# Patient Record
Sex: Male | Born: 1955 | Race: White | Hispanic: No | Marital: Married | Smoking: Former smoker
Health system: Southern US, Community
[De-identification: ages and names within clinical notes are randomized; demographics above are authoritative.]

## PROBLEM LIST (undated history)

## (undated) DIAGNOSIS — E7800 Pure hypercholesterolemia, unspecified: Secondary | ICD-10-CM

## (undated) DIAGNOSIS — R338 Other retention of urine: Secondary | ICD-10-CM

## (undated) DIAGNOSIS — I10 Essential (primary) hypertension: Secondary | ICD-10-CM

## (undated) DIAGNOSIS — R0602 Shortness of breath: Secondary | ICD-10-CM

## (undated) DIAGNOSIS — C3490 Malignant neoplasm of unspecified part of unspecified bronchus or lung: Secondary | ICD-10-CM

## (undated) DIAGNOSIS — C719 Malignant neoplasm of brain, unspecified: Secondary | ICD-10-CM

## (undated) DIAGNOSIS — J449 Chronic obstructive pulmonary disease, unspecified: Secondary | ICD-10-CM

## (undated) DIAGNOSIS — M1990 Unspecified osteoarthritis, unspecified site: Secondary | ICD-10-CM

## (undated) DIAGNOSIS — G4700 Insomnia, unspecified: Secondary | ICD-10-CM

## (undated) DIAGNOSIS — F419 Anxiety disorder, unspecified: Secondary | ICD-10-CM

## (undated) HISTORY — DX: Pure hypercholesterolemia, unspecified: E78.00

## (undated) HISTORY — DX: Chronic obstructive pulmonary disease, unspecified: J44.9

## (undated) HISTORY — DX: Malignant neoplasm of unspecified part of unspecified bronchus or lung: C34.90

## (undated) HISTORY — DX: Essential (primary) hypertension: I10

## (undated) HISTORY — DX: Malignant neoplasm of brain, unspecified: C71.9

---

## 2011-05-24 ENCOUNTER — Other Ambulatory Visit: Payer: Self-pay | Admitting: Internal Medicine

## 2011-05-30 ENCOUNTER — Ambulatory Visit (HOSPITAL_BASED_OUTPATIENT_CLINIC_OR_DEPARTMENT_OTHER): Payer: BC Managed Care – PPO | Admitting: Internal Medicine

## 2011-05-30 ENCOUNTER — Encounter (INDEPENDENT_AMBULATORY_CARE_PROVIDER_SITE_OTHER): Payer: BC Managed Care – PPO

## 2011-05-30 DIAGNOSIS — C3490 Malignant neoplasm of unspecified part of unspecified bronchus or lung: Secondary | ICD-10-CM

## 2011-05-31 ENCOUNTER — Other Ambulatory Visit: Payer: Self-pay | Admitting: Internal Medicine

## 2011-05-31 DIAGNOSIS — Z85118 Personal history of other malignant neoplasm of bronchus and lung: Secondary | ICD-10-CM

## 2011-05-31 DIAGNOSIS — C3490 Malignant neoplasm of unspecified part of unspecified bronchus or lung: Secondary | ICD-10-CM

## 2011-06-03 ENCOUNTER — Encounter (HOSPITAL_BASED_OUTPATIENT_CLINIC_OR_DEPARTMENT_OTHER): Payer: BC Managed Care – PPO | Admitting: Internal Medicine

## 2011-06-03 ENCOUNTER — Other Ambulatory Visit: Payer: Self-pay | Admitting: Internal Medicine

## 2011-06-03 DIAGNOSIS — C3490 Malignant neoplasm of unspecified part of unspecified bronchus or lung: Secondary | ICD-10-CM

## 2011-06-03 LAB — CBC WITH DIFFERENTIAL/PLATELET
Basophils Absolute: 0 10*3/uL (ref 0.0–0.1)
Eosinophils Absolute: 0.1 10*3/uL (ref 0.0–0.5)
HGB: 14.9 g/dL (ref 13.0–17.1)
LYMPH%: 25.8 % (ref 14.0–49.0)
MONO#: 0.7 10*3/uL (ref 0.1–0.9)
NEUT#: 4.2 10*3/uL (ref 1.5–6.5)
Platelets: 247 10*3/uL (ref 140–400)
RBC: 5.35 10*6/uL (ref 4.20–5.82)
WBC: 6.8 10*3/uL (ref 4.0–10.3)

## 2011-06-03 LAB — COMPREHENSIVE METABOLIC PANEL
Albumin: 4.1 g/dL (ref 3.5–5.2)
BUN: 12 mg/dL (ref 6–23)
CO2: 25 mEq/L (ref 19–32)
Glucose, Bld: 91 mg/dL (ref 70–99)
Potassium: 4.6 mEq/L (ref 3.5–5.3)
Sodium: 139 mEq/L (ref 135–145)
Total Bilirubin: 0.2 mg/dL — ABNORMAL LOW (ref 0.3–1.2)
Total Protein: 7.1 g/dL (ref 6.0–8.3)

## 2011-06-04 ENCOUNTER — Encounter (HOSPITAL_BASED_OUTPATIENT_CLINIC_OR_DEPARTMENT_OTHER): Payer: BC Managed Care – PPO | Admitting: Internal Medicine

## 2011-06-04 ENCOUNTER — Other Ambulatory Visit: Payer: Self-pay | Admitting: Internal Medicine

## 2011-06-04 DIAGNOSIS — C3410 Malignant neoplasm of upper lobe, unspecified bronchus or lung: Secondary | ICD-10-CM

## 2011-06-04 DIAGNOSIS — Z5111 Encounter for antineoplastic chemotherapy: Secondary | ICD-10-CM

## 2011-06-04 DIAGNOSIS — Z77018 Contact with and (suspected) exposure to other hazardous metals: Secondary | ICD-10-CM

## 2011-06-05 ENCOUNTER — Encounter (HOSPITAL_BASED_OUTPATIENT_CLINIC_OR_DEPARTMENT_OTHER): Payer: BC Managed Care – PPO | Admitting: Internal Medicine

## 2011-06-05 ENCOUNTER — Encounter (HOSPITAL_COMMUNITY)
Admission: RE | Admit: 2011-06-05 | Discharge: 2011-06-05 | Disposition: A | Discharge status: Home / Self Care | Payer: BC Managed Care – PPO | Source: Ambulatory Visit | Attending: Internal Medicine | Admitting: Internal Medicine

## 2011-06-05 DIAGNOSIS — C3410 Malignant neoplasm of upper lobe, unspecified bronchus or lung: Secondary | ICD-10-CM

## 2011-06-05 DIAGNOSIS — C3490 Malignant neoplasm of unspecified part of unspecified bronchus or lung: Secondary | ICD-10-CM

## 2011-06-05 DIAGNOSIS — Z5111 Encounter for antineoplastic chemotherapy: Secondary | ICD-10-CM

## 2011-06-05 LAB — GLUCOSE, CAPILLARY: Glucose-Capillary: 119 mg/dL — ABNORMAL HIGH (ref 70–99)

## 2011-06-05 MED ORDER — FLUDEOXYGLUCOSE F - 18 (FDG) INJECTION
18.5000 | Freq: Once | INTRAVENOUS | Status: AC | PRN
  Administered 2011-06-05: 18.5 via INTRAVENOUS

## 2011-06-06 ENCOUNTER — Ambulatory Visit
Admission: RE | Admit: 2011-06-06 | Discharge: 2011-06-06 | Disposition: A | Discharge status: Home / Self Care | Payer: BC Managed Care – PPO | Source: Ambulatory Visit | Attending: Internal Medicine | Admitting: Internal Medicine

## 2011-06-06 ENCOUNTER — Encounter (HOSPITAL_BASED_OUTPATIENT_CLINIC_OR_DEPARTMENT_OTHER): Payer: BC Managed Care – PPO | Admitting: Internal Medicine

## 2011-06-06 DIAGNOSIS — C3410 Malignant neoplasm of upper lobe, unspecified bronchus or lung: Secondary | ICD-10-CM

## 2011-06-06 DIAGNOSIS — Z5111 Encounter for antineoplastic chemotherapy: Secondary | ICD-10-CM

## 2011-06-06 DIAGNOSIS — Z85118 Personal history of other malignant neoplasm of bronchus and lung: Secondary | ICD-10-CM

## 2011-06-06 DIAGNOSIS — Z77018 Contact with and (suspected) exposure to other hazardous metals: Secondary | ICD-10-CM

## 2011-06-06 MED ORDER — GADOBENATE DIMEGLUMINE 529 MG/ML IV SOLN
20.0000 mL | Freq: Once | INTRAVENOUS | Status: AC | PRN
  Administered 2011-06-06: 20 mL via INTRAVENOUS

## 2011-06-07 ENCOUNTER — Encounter (HOSPITAL_BASED_OUTPATIENT_CLINIC_OR_DEPARTMENT_OTHER): Payer: BC Managed Care – PPO | Admitting: Internal Medicine

## 2011-06-07 DIAGNOSIS — IMO0002 Reserved for concepts with insufficient information to code with codable children: Secondary | ICD-10-CM

## 2011-06-07 DIAGNOSIS — C3410 Malignant neoplasm of upper lobe, unspecified bronchus or lung: Secondary | ICD-10-CM

## 2011-06-11 ENCOUNTER — Other Ambulatory Visit: Payer: Self-pay | Admitting: Internal Medicine

## 2011-06-11 ENCOUNTER — Encounter (HOSPITAL_BASED_OUTPATIENT_CLINIC_OR_DEPARTMENT_OTHER): Payer: BC Managed Care – PPO | Admitting: Internal Medicine

## 2011-06-11 DIAGNOSIS — C3410 Malignant neoplasm of upper lobe, unspecified bronchus or lung: Secondary | ICD-10-CM

## 2011-06-11 DIAGNOSIS — C3490 Malignant neoplasm of unspecified part of unspecified bronchus or lung: Secondary | ICD-10-CM

## 2011-06-11 LAB — COMPREHENSIVE METABOLIC PANEL
ALT: 28 U/L (ref 0–53)
Alkaline Phosphatase: 92 U/L (ref 39–117)
Sodium: 138 mEq/L (ref 135–145)
Total Bilirubin: 0.9 mg/dL (ref 0.3–1.2)
Total Protein: 6.3 g/dL (ref 6.0–8.3)

## 2011-06-11 LAB — CBC WITH DIFFERENTIAL/PLATELET
BASO%: 0.6 % (ref 0.0–2.0)
EOS%: 2.9 % (ref 0.0–7.0)
HCT: 40.7 % (ref 38.4–49.9)
MCH: 28.3 pg (ref 27.2–33.4)
MCHC: 34 g/dL (ref 32.0–36.0)
MONO#: 0.1 10*3/uL (ref 0.1–0.9)
RBC: 4.89 10*6/uL (ref 4.20–5.82)
RDW: 13.7 % (ref 11.0–14.6)
WBC: 4 10*3/uL (ref 4.0–10.3)
lymph#: 1.4 10*3/uL (ref 0.9–3.3)

## 2011-06-18 ENCOUNTER — Other Ambulatory Visit: Payer: Self-pay | Admitting: Internal Medicine

## 2011-06-18 ENCOUNTER — Encounter (HOSPITAL_BASED_OUTPATIENT_CLINIC_OR_DEPARTMENT_OTHER): Payer: BC Managed Care – PPO | Admitting: Internal Medicine

## 2011-06-18 DIAGNOSIS — C3490 Malignant neoplasm of unspecified part of unspecified bronchus or lung: Secondary | ICD-10-CM

## 2011-06-18 LAB — COMPREHENSIVE METABOLIC PANEL
Alkaline Phosphatase: 86 U/L (ref 39–117)
Creatinine, Ser: 0.88 mg/dL (ref 0.50–1.35)
Glucose, Bld: 81 mg/dL (ref 70–99)
Sodium: 139 mEq/L (ref 135–145)
Total Bilirubin: 0.2 mg/dL — ABNORMAL LOW (ref 0.3–1.2)
Total Protein: 6.6 g/dL (ref 6.0–8.3)

## 2011-06-18 LAB — CBC WITH DIFFERENTIAL/PLATELET
BASO%: 0.2 % (ref 0.0–2.0)
Eosinophils Absolute: 0.1 10*3/uL (ref 0.0–0.5)
LYMPH%: 30.5 % (ref 14.0–49.0)
MCHC: 33.8 g/dL (ref 32.0–36.0)
MCV: 82.9 fL (ref 79.3–98.0)
MONO%: 9.5 % (ref 0.0–14.0)
NEUT%: 59.2 % (ref 39.0–75.0)
Platelets: 191 10*3/uL (ref 140–400)
RBC: 4.93 10*6/uL (ref 4.20–5.82)

## 2011-06-24 ENCOUNTER — Other Ambulatory Visit: Payer: Self-pay | Admitting: Internal Medicine

## 2011-06-24 ENCOUNTER — Encounter (HOSPITAL_BASED_OUTPATIENT_CLINIC_OR_DEPARTMENT_OTHER): Payer: BC Managed Care – PPO | Admitting: Internal Medicine

## 2011-06-24 DIAGNOSIS — C3490 Malignant neoplasm of unspecified part of unspecified bronchus or lung: Secondary | ICD-10-CM

## 2011-06-24 LAB — CBC WITH DIFFERENTIAL/PLATELET
Basophils Absolute: 0 10*3/uL (ref 0.0–0.1)
Eosinophils Absolute: 0 10*3/uL (ref 0.0–0.5)
HCT: 37.7 % — ABNORMAL LOW (ref 38.4–49.9)
LYMPH%: 23.1 % (ref 14.0–49.0)
MCV: 82.8 fL (ref 79.3–98.0)
MONO#: 1 10*3/uL — ABNORMAL HIGH (ref 0.1–0.9)
MONO%: 12.1 % (ref 0.0–14.0)
NEUT#: 5.5 10*3/uL (ref 1.5–6.5)
NEUT%: 64.3 % (ref 39.0–75.0)
Platelets: 369 10*3/uL (ref 140–400)
RBC: 4.55 10*6/uL (ref 4.20–5.82)

## 2011-06-24 LAB — COMPREHENSIVE METABOLIC PANEL
Alkaline Phosphatase: 86 U/L (ref 39–117)
BUN: 8 mg/dL (ref 6–23)
CO2: 29 mEq/L (ref 19–32)
Creatinine, Ser: 0.83 mg/dL (ref 0.50–1.35)
Glucose, Bld: 108 mg/dL — ABNORMAL HIGH (ref 70–99)
Total Bilirubin: 0.1 mg/dL — ABNORMAL LOW (ref 0.3–1.2)

## 2011-06-25 ENCOUNTER — Encounter (HOSPITAL_BASED_OUTPATIENT_CLINIC_OR_DEPARTMENT_OTHER): Payer: BC Managed Care – PPO | Admitting: Internal Medicine

## 2011-06-25 DIAGNOSIS — C3410 Malignant neoplasm of upper lobe, unspecified bronchus or lung: Secondary | ICD-10-CM

## 2011-06-25 DIAGNOSIS — Z5111 Encounter for antineoplastic chemotherapy: Secondary | ICD-10-CM

## 2011-06-26 ENCOUNTER — Encounter (HOSPITAL_BASED_OUTPATIENT_CLINIC_OR_DEPARTMENT_OTHER): Payer: BC Managed Care – PPO | Admitting: Internal Medicine

## 2011-06-26 DIAGNOSIS — C3410 Malignant neoplasm of upper lobe, unspecified bronchus or lung: Secondary | ICD-10-CM

## 2011-06-26 DIAGNOSIS — Z5111 Encounter for antineoplastic chemotherapy: Secondary | ICD-10-CM

## 2011-06-27 ENCOUNTER — Encounter (HOSPITAL_BASED_OUTPATIENT_CLINIC_OR_DEPARTMENT_OTHER): Payer: BC Managed Care – PPO | Admitting: Internal Medicine

## 2011-06-27 DIAGNOSIS — Z5111 Encounter for antineoplastic chemotherapy: Secondary | ICD-10-CM

## 2011-06-27 DIAGNOSIS — C3410 Malignant neoplasm of upper lobe, unspecified bronchus or lung: Secondary | ICD-10-CM

## 2011-06-28 ENCOUNTER — Encounter (HOSPITAL_BASED_OUTPATIENT_CLINIC_OR_DEPARTMENT_OTHER): Payer: BC Managed Care – PPO | Admitting: Internal Medicine

## 2011-06-28 DIAGNOSIS — C3410 Malignant neoplasm of upper lobe, unspecified bronchus or lung: Secondary | ICD-10-CM

## 2011-06-28 DIAGNOSIS — IMO0002 Reserved for concepts with insufficient information to code with codable children: Secondary | ICD-10-CM

## 2011-07-02 ENCOUNTER — Other Ambulatory Visit: Payer: Self-pay | Admitting: Internal Medicine

## 2011-07-02 ENCOUNTER — Encounter (HOSPITAL_BASED_OUTPATIENT_CLINIC_OR_DEPARTMENT_OTHER): Payer: BC Managed Care – PPO | Admitting: Internal Medicine

## 2011-07-02 DIAGNOSIS — C3490 Malignant neoplasm of unspecified part of unspecified bronchus or lung: Secondary | ICD-10-CM

## 2011-07-02 LAB — CBC WITH DIFFERENTIAL/PLATELET
BASO%: 0.4 % (ref 0.0–2.0)
Basophils Absolute: 0 10*3/uL (ref 0.0–0.1)
EOS%: 0.5 % (ref 0.0–7.0)
HGB: 12.5 g/dL — ABNORMAL LOW (ref 13.0–17.1)
MCH: 28.4 pg (ref 27.2–33.4)
MCHC: 34.3 g/dL (ref 32.0–36.0)
MCV: 82.7 fL (ref 79.3–98.0)
MONO%: 5.1 % (ref 0.0–14.0)
NEUT%: 76.6 % — ABNORMAL HIGH (ref 39.0–75.0)
RDW: 14.6 % (ref 11.0–14.6)

## 2011-07-02 LAB — COMPREHENSIVE METABOLIC PANEL
AST: 14 U/L (ref 0–37)
Alkaline Phosphatase: 110 U/L (ref 39–117)
BUN: 13 mg/dL (ref 6–23)
Creatinine, Ser: 0.81 mg/dL (ref 0.50–1.35)
Potassium: 4.5 mEq/L (ref 3.5–5.3)

## 2011-07-09 ENCOUNTER — Other Ambulatory Visit: Payer: Self-pay | Admitting: Internal Medicine

## 2011-07-09 ENCOUNTER — Encounter (HOSPITAL_BASED_OUTPATIENT_CLINIC_OR_DEPARTMENT_OTHER): Payer: BC Managed Care – PPO | Admitting: Internal Medicine

## 2011-07-09 DIAGNOSIS — C3490 Malignant neoplasm of unspecified part of unspecified bronchus or lung: Secondary | ICD-10-CM

## 2011-07-09 LAB — COMPREHENSIVE METABOLIC PANEL
AST: 14 U/L (ref 0–37)
Alkaline Phosphatase: 105 U/L (ref 39–117)
BUN: 11 mg/dL (ref 6–23)
Calcium: 9.4 mg/dL (ref 8.4–10.5)
Creatinine, Ser: 0.94 mg/dL (ref 0.50–1.35)
Total Bilirubin: 0.3 mg/dL (ref 0.3–1.2)

## 2011-07-09 LAB — CBC WITH DIFFERENTIAL/PLATELET
Basophils Absolute: 0 10*3/uL (ref 0.0–0.1)
EOS%: 1.2 % (ref 0.0–7.0)
HCT: 38 % — ABNORMAL LOW (ref 38.4–49.9)
HGB: 12.9 g/dL — ABNORMAL LOW (ref 13.0–17.1)
LYMPH%: 26.2 % (ref 14.0–49.0)
MCH: 28 pg (ref 27.2–33.4)
MCV: 82.4 fL (ref 79.3–98.0)
MONO%: 7.7 % (ref 0.0–14.0)
NEUT%: 64.6 % (ref 39.0–75.0)

## 2011-07-16 ENCOUNTER — Other Ambulatory Visit: Payer: Self-pay | Admitting: Internal Medicine

## 2011-07-16 ENCOUNTER — Encounter (HOSPITAL_BASED_OUTPATIENT_CLINIC_OR_DEPARTMENT_OTHER): Payer: BC Managed Care – PPO | Admitting: Internal Medicine

## 2011-07-16 DIAGNOSIS — C3490 Malignant neoplasm of unspecified part of unspecified bronchus or lung: Secondary | ICD-10-CM

## 2011-07-16 DIAGNOSIS — Z5111 Encounter for antineoplastic chemotherapy: Secondary | ICD-10-CM

## 2011-07-16 DIAGNOSIS — C3410 Malignant neoplasm of upper lobe, unspecified bronchus or lung: Secondary | ICD-10-CM

## 2011-07-16 LAB — CBC WITH DIFFERENTIAL/PLATELET
Basophils Absolute: 0 10*3/uL (ref 0.0–0.1)
EOS%: 1 % (ref 0.0–7.0)
HCT: 38 % — ABNORMAL LOW (ref 38.4–49.9)
HGB: 12.9 g/dL — ABNORMAL LOW (ref 13.0–17.1)
MCH: 28.1 pg (ref 27.2–33.4)
MCV: 82.7 fL (ref 79.3–98.0)
NEUT%: 56.8 % (ref 39.0–75.0)
lymph#: 2.5 10*3/uL (ref 0.9–3.3)

## 2011-07-17 ENCOUNTER — Encounter (HOSPITAL_BASED_OUTPATIENT_CLINIC_OR_DEPARTMENT_OTHER): Payer: BC Managed Care – PPO | Admitting: Internal Medicine

## 2011-07-17 DIAGNOSIS — Z5111 Encounter for antineoplastic chemotherapy: Secondary | ICD-10-CM

## 2011-07-17 DIAGNOSIS — C3410 Malignant neoplasm of upper lobe, unspecified bronchus or lung: Secondary | ICD-10-CM

## 2011-07-17 LAB — COMPREHENSIVE METABOLIC PANEL
Alkaline Phosphatase: 84 U/L (ref 39–117)
BUN: 8 mg/dL (ref 6–23)
Creatinine, Ser: 0.85 mg/dL (ref 0.50–1.35)
Glucose, Bld: 92 mg/dL (ref 70–99)
Total Bilirubin: 0.2 mg/dL — ABNORMAL LOW (ref 0.3–1.2)

## 2011-07-18 ENCOUNTER — Encounter (HOSPITAL_BASED_OUTPATIENT_CLINIC_OR_DEPARTMENT_OTHER): Payer: BC Managed Care – PPO | Admitting: Internal Medicine

## 2011-07-18 DIAGNOSIS — C3410 Malignant neoplasm of upper lobe, unspecified bronchus or lung: Secondary | ICD-10-CM

## 2011-07-18 DIAGNOSIS — Z5111 Encounter for antineoplastic chemotherapy: Secondary | ICD-10-CM

## 2011-07-19 ENCOUNTER — Encounter (HOSPITAL_BASED_OUTPATIENT_CLINIC_OR_DEPARTMENT_OTHER): Payer: BC Managed Care – PPO | Admitting: Internal Medicine

## 2011-07-19 DIAGNOSIS — Z5189 Encounter for other specified aftercare: Secondary | ICD-10-CM

## 2011-07-19 DIAGNOSIS — C3410 Malignant neoplasm of upper lobe, unspecified bronchus or lung: Secondary | ICD-10-CM

## 2011-07-23 ENCOUNTER — Other Ambulatory Visit: Payer: Self-pay | Admitting: Internal Medicine

## 2011-07-23 ENCOUNTER — Encounter (HOSPITAL_BASED_OUTPATIENT_CLINIC_OR_DEPARTMENT_OTHER): Payer: BC Managed Care – PPO | Admitting: Internal Medicine

## 2011-07-23 DIAGNOSIS — C3490 Malignant neoplasm of unspecified part of unspecified bronchus or lung: Secondary | ICD-10-CM

## 2011-07-23 LAB — CBC WITH DIFFERENTIAL/PLATELET
Basophils Absolute: 0.1 10*3/uL (ref 0.0–0.1)
Eosinophils Absolute: 0.1 10*3/uL (ref 0.0–0.5)
HGB: 12.6 g/dL — ABNORMAL LOW (ref 13.0–17.1)
LYMPH%: 13.8 % — ABNORMAL LOW (ref 14.0–49.0)
MCV: 83.9 fL (ref 79.3–98.0)
MONO#: 0.4 10*3/uL (ref 0.1–0.9)
MONO%: 2.3 % (ref 0.0–14.0)
NEUT#: 12.7 10*3/uL — ABNORMAL HIGH (ref 1.5–6.5)
Platelets: 348 10*3/uL (ref 140–400)
WBC: 15.2 10*3/uL — ABNORMAL HIGH (ref 4.0–10.3)

## 2011-07-23 LAB — COMPREHENSIVE METABOLIC PANEL
Albumin: 4.3 g/dL (ref 3.5–5.2)
Alkaline Phosphatase: 122 U/L — ABNORMAL HIGH (ref 39–117)
BUN: 12 mg/dL (ref 6–23)
CO2: 25 mEq/L (ref 19–32)
Glucose, Bld: 84 mg/dL (ref 70–99)
Potassium: 4.3 mEq/L (ref 3.5–5.3)
Total Bilirubin: 0.5 mg/dL (ref 0.3–1.2)
Total Protein: 7 g/dL (ref 6.0–8.3)

## 2011-07-30 ENCOUNTER — Other Ambulatory Visit: Payer: Self-pay | Admitting: Internal Medicine

## 2011-07-30 ENCOUNTER — Encounter (HOSPITAL_BASED_OUTPATIENT_CLINIC_OR_DEPARTMENT_OTHER): Payer: BC Managed Care – PPO | Admitting: Internal Medicine

## 2011-07-30 DIAGNOSIS — C3490 Malignant neoplasm of unspecified part of unspecified bronchus or lung: Secondary | ICD-10-CM

## 2011-07-30 LAB — CBC WITH DIFFERENTIAL/PLATELET
Basophils Absolute: 0 10*3/uL (ref 0.0–0.1)
Eosinophils Absolute: 0.1 10*3/uL (ref 0.0–0.5)
HGB: 12.4 g/dL — ABNORMAL LOW (ref 13.0–17.1)
MCV: 84.9 fL (ref 79.3–98.0)
MONO#: 0.8 10*3/uL (ref 0.1–0.9)
MONO%: 9.5 % (ref 0.0–14.0)
NEUT#: 4.7 10*3/uL (ref 1.5–6.5)
RBC: 4.29 10*6/uL (ref 4.20–5.82)
RDW: 18.2 % — ABNORMAL HIGH (ref 11.0–14.6)
WBC: 8.2 10*3/uL (ref 4.0–10.3)
lymph#: 2.5 10*3/uL (ref 0.9–3.3)

## 2011-07-30 LAB — COMPREHENSIVE METABOLIC PANEL
Albumin: 3.9 g/dL (ref 3.5–5.2)
Alkaline Phosphatase: 103 U/L (ref 39–117)
BUN: 10 mg/dL (ref 6–23)
CO2: 25 mEq/L (ref 19–32)
Calcium: 8.8 mg/dL (ref 8.4–10.5)
Chloride: 108 mEq/L (ref 96–112)
Glucose, Bld: 95 mg/dL (ref 70–99)
Potassium: 4.5 mEq/L (ref 3.5–5.3)
Sodium: 138 mEq/L (ref 135–145)
Total Protein: 6.6 g/dL (ref 6.0–8.3)

## 2011-08-02 ENCOUNTER — Ambulatory Visit (HOSPITAL_COMMUNITY)
Admission: RE | Admit: 2011-08-02 | Discharge: 2011-08-02 | Disposition: A | Discharge status: Home / Self Care | Payer: BC Managed Care – PPO | Source: Ambulatory Visit | Attending: Internal Medicine | Admitting: Internal Medicine

## 2011-08-02 ENCOUNTER — Other Ambulatory Visit (HOSPITAL_COMMUNITY): Payer: BC Managed Care – PPO

## 2011-08-02 DIAGNOSIS — I2510 Atherosclerotic heart disease of native coronary artery without angina pectoris: Secondary | ICD-10-CM | POA: Insufficient documentation

## 2011-08-02 DIAGNOSIS — J984 Other disorders of lung: Secondary | ICD-10-CM | POA: Insufficient documentation

## 2011-08-02 DIAGNOSIS — C3490 Malignant neoplasm of unspecified part of unspecified bronchus or lung: Secondary | ICD-10-CM | POA: Insufficient documentation

## 2011-08-02 MED ORDER — IOHEXOL 300 MG/ML  SOLN
80.0000 mL | Freq: Once | INTRAMUSCULAR | Status: AC | PRN
  Administered 2011-08-02: 80 mL via INTRAVENOUS

## 2011-08-06 ENCOUNTER — Other Ambulatory Visit: Payer: Self-pay | Admitting: Internal Medicine

## 2011-08-06 ENCOUNTER — Encounter (HOSPITAL_BASED_OUTPATIENT_CLINIC_OR_DEPARTMENT_OTHER): Payer: BC Managed Care – PPO | Admitting: Internal Medicine

## 2011-08-06 DIAGNOSIS — Z79899 Other long term (current) drug therapy: Secondary | ICD-10-CM

## 2011-08-06 DIAGNOSIS — IMO0002 Reserved for concepts with insufficient information to code with codable children: Secondary | ICD-10-CM

## 2011-08-06 DIAGNOSIS — C3490 Malignant neoplasm of unspecified part of unspecified bronchus or lung: Secondary | ICD-10-CM

## 2011-08-06 DIAGNOSIS — Z5111 Encounter for antineoplastic chemotherapy: Secondary | ICD-10-CM

## 2011-08-06 DIAGNOSIS — C3410 Malignant neoplasm of upper lobe, unspecified bronchus or lung: Secondary | ICD-10-CM

## 2011-08-06 LAB — CBC WITH DIFFERENTIAL/PLATELET
BASO%: 0.3 % (ref 0.0–2.0)
Eosinophils Absolute: 0.1 10*3/uL (ref 0.0–0.5)
LYMPH%: 22.7 % (ref 14.0–49.0)
MCHC: 34.3 g/dL (ref 32.0–36.0)
MCV: 84.9 fL (ref 79.3–98.0)
MONO#: 1.2 10*3/uL — ABNORMAL HIGH (ref 0.1–0.9)
MONO%: 14.6 % — ABNORMAL HIGH (ref 0.0–14.0)
NEUT#: 5.1 10*3/uL (ref 1.5–6.5)
RBC: 4.49 10*6/uL (ref 4.20–5.82)
RDW: 18.7 % — ABNORMAL HIGH (ref 11.0–14.6)
WBC: 8.3 10*3/uL (ref 4.0–10.3)

## 2011-08-12 ENCOUNTER — Encounter: Payer: Self-pay | Admitting: Thoracic Surgery

## 2011-08-12 DIAGNOSIS — I10 Essential (primary) hypertension: Secondary | ICD-10-CM

## 2011-08-12 DIAGNOSIS — E7800 Pure hypercholesterolemia, unspecified: Secondary | ICD-10-CM

## 2011-08-15 ENCOUNTER — Other Ambulatory Visit: Payer: Self-pay | Admitting: Thoracic Surgery

## 2011-08-15 ENCOUNTER — Encounter: Payer: BC Managed Care – PPO | Admitting: Internal Medicine

## 2011-08-15 ENCOUNTER — Ambulatory Visit (INDEPENDENT_AMBULATORY_CARE_PROVIDER_SITE_OTHER): Payer: BC Managed Care – PPO | Admitting: Thoracic Surgery

## 2011-08-15 ENCOUNTER — Encounter: Payer: Self-pay | Admitting: Thoracic Surgery

## 2011-08-15 VITALS — BP 144/90 | HR 80 | Resp 18 | Ht 73.0 in | Wt 232.0 lb

## 2011-08-15 DIAGNOSIS — C3490 Malignant neoplasm of unspecified part of unspecified bronchus or lung: Secondary | ICD-10-CM

## 2011-08-15 NOTE — Progress Notes (Signed)
PCP is [name], MD Referring Provider is Lajuana Matte., MD  Chief Complaint  Patient presents with  . Lung Cancer    S/P chemotherapy. Discuss surgery    HPI: This 55 year old Caucasian smoker was found to have a left upper lobe lesion. 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 He underwent 3 cycles of chemotherapy with a marked reduction in left upper lobe lesion. Initial PET scan showed no evidence of metastases metastatic disease. Initial brain MRI was also negative. Followup CT scan showed marked reduction in the lung lesion. He is referred here for possible resection. We plan to to get another PET scan and pulmonary function tests. He is still smoking he was cautioned to quit smoking he said no hemoptysis fever chills or excessive sputum. We will schedule surgery for October 22. We'll see him back in one week.  Past Medical History  Diagnosis Date  . HTN (hypertension)   . Hypercholesteremia     No past surgical history on file.  No family history on file.  Social History History  Substance Use Topics  . Smoking status: Current Everyday Smoker -- 2.0 packs/day    Types: Cigarettes  . Smokeless tobacco: Not on file  . Alcohol Use: 3.0 - 6.0 oz/week    6-12 drink(s) per week    Current Outpatient Prescriptions  Medication Sig Dispense Refill  . albuterol (PROVENTIL HFA;VENTOLIN HFA) 108 (90 BASE) MCG/ACT inhaler Inhale 2 puffs into the lungs every 6 (six) hours as needed.          No Known Allergies  Review of Systems  Constitutional: Negative.   HENT: Negative.   Eyes: Negative.   Respiratory: Negative for cough, choking, chest tightness and shortness of breath.   Cardiovascular: Negative.   Gastrointestinal: Negative.   Genitourinary: Negative.   Musculoskeletal: Negative.   Neurological: Negative.   Hematological: Negative.   Psychiatric/Behavioral: Negative.     BP 144/90  Pulse 80  Resp 18  Ht 6\' 1"  (1.854 m)  Wt  232 lb (105.235 kg)  BMI 30.61 kg/m2  SpO2 98% Physical Exam  Constitutional: He is oriented to person, place, and time. He appears well-developed and well-nourished.  HENT:  Head: Normocephalic and atraumatic.  Right Ear: External ear normal.  Mouth/Throat: Oropharynx is clear and moist.  Eyes: EOM are normal. Pupils are equal, round, and reactive to light.  Neck: Normal range of motion. Neck supple.  Cardiovascular: Normal rate, regular rhythm, normal heart sounds and intact distal pulses.   Pulmonary/Chest: Effort normal and breath sounds normal. No respiratory distress.  Abdominal: Bowel sounds are normal. He exhibits no distension. There is no tenderness.  Musculoskeletal: He exhibits no edema and no tenderness.  Lymphadenopathy:    He has no cervical adenopathy.  Neurological: He is alert and oriented to person, place, and time. He has normal reflexes.  Skin: Skin is warm and dry.  Psychiatric: He has a normal mood and affect.     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 Status post chemotherapy.   Plan: Repeat PET scan and obtaining pulmonary function test. Probably a left upper lobectomy.

## 2011-08-22 ENCOUNTER — Encounter (HOSPITAL_COMMUNITY): Payer: Self-pay

## 2011-08-22 ENCOUNTER — Encounter (HOSPITAL_COMMUNITY)
Admission: RE | Admit: 2011-08-22 | Discharge: 2011-08-22 | Disposition: A | Discharge status: Home / Self Care | Payer: BC Managed Care – PPO | Source: Ambulatory Visit | Attending: Thoracic Surgery | Admitting: Thoracic Surgery

## 2011-08-22 ENCOUNTER — Ambulatory Visit (HOSPITAL_COMMUNITY)
Admission: RE | Admit: 2011-08-22 | Discharge: 2011-08-22 | Disposition: A | Discharge status: Home / Self Care | Payer: BC Managed Care – PPO | Source: Ambulatory Visit | Attending: Thoracic Surgery | Admitting: Thoracic Surgery

## 2011-08-22 DIAGNOSIS — C3490 Malignant neoplasm of unspecified part of unspecified bronchus or lung: Secondary | ICD-10-CM

## 2011-08-22 DIAGNOSIS — M954 Acquired deformity of chest and rib: Secondary | ICD-10-CM | POA: Insufficient documentation

## 2011-08-22 DIAGNOSIS — C3410 Malignant neoplasm of upper lobe, unspecified bronchus or lung: Secondary | ICD-10-CM | POA: Insufficient documentation

## 2011-08-22 DIAGNOSIS — R918 Other nonspecific abnormal finding of lung field: Secondary | ICD-10-CM | POA: Insufficient documentation

## 2011-08-22 LAB — GLUCOSE, CAPILLARY: Glucose-Capillary: 100 mg/dL — ABNORMAL HIGH (ref 70–99)

## 2011-08-22 LAB — PULMONARY FUNCTION TEST

## 2011-08-22 MED ORDER — FLUDEOXYGLUCOSE F - 18 (FDG) INJECTION
16.2000 | Freq: Once | INTRAVENOUS | Status: AC | PRN
  Administered 2011-08-22: 16.2 via INTRAVENOUS

## 2011-08-29 ENCOUNTER — Encounter: Payer: Self-pay | Admitting: Thoracic Surgery

## 2011-08-29 ENCOUNTER — Ambulatory Visit (INDEPENDENT_AMBULATORY_CARE_PROVIDER_SITE_OTHER): Payer: Self-pay | Admitting: Thoracic Surgery

## 2011-08-29 ENCOUNTER — Encounter: Payer: Self-pay | Admitting: *Deleted

## 2011-08-29 ENCOUNTER — Encounter (HOSPITAL_COMMUNITY)
Admission: RE | Admit: 2011-08-29 | Discharge: 2011-08-29 | Disposition: A | Discharge status: Home / Self Care | Payer: BC Managed Care – PPO | Source: Ambulatory Visit | Attending: Thoracic Surgery | Admitting: Thoracic Surgery

## 2011-08-29 VITALS — BP 150/95 | HR 82 | Resp 18

## 2011-08-29 DIAGNOSIS — C3490 Malignant neoplasm of unspecified part of unspecified bronchus or lung: Secondary | ICD-10-CM

## 2011-08-29 LAB — COMPREHENSIVE METABOLIC PANEL
ALT: 15 U/L (ref 0–53)
AST: 15 U/L (ref 0–37)
Albumin: 3.6 g/dL (ref 3.5–5.2)
CO2: 26 mEq/L (ref 19–32)
Chloride: 103 mEq/L (ref 96–112)
Creatinine, Ser: 0.86 mg/dL (ref 0.50–1.35)
GFR calc non Af Amer: >90 mL/min (ref >90)
Potassium: 4 mEq/L (ref 3.5–5.1)
Sodium: 139 mEq/L (ref 135–145)
Total Bilirubin: 0.2 mg/dL — ABNORMAL LOW (ref 0.3–1.2)

## 2011-08-29 LAB — URINE MICROSCOPIC-ADD ON

## 2011-08-29 LAB — BLOOD GAS, ARTERIAL
Acid-Base Excess: 1.2 mmol/L (ref 0.0–2.0)
Bicarbonate: 24.2 mEq/L — ABNORMAL HIGH (ref 20.0–24.0)
TCO2: 25.2 mmol/L (ref 0–100)
pCO2 arterial: 31.5 mmHg — ABNORMAL LOW (ref 35.0–45.0)
pH, Arterial: 7.498 — ABNORMAL HIGH (ref 7.350–7.450)

## 2011-08-29 LAB — CBC
HCT: 38.5 % — ABNORMAL LOW (ref 39.0–52.0)
Hemoglobin: 13.1 g/dL (ref 13.0–17.0)
MCV: 85.4 fL (ref 78.0–100.0)
RBC: 4.51 MIL/uL (ref 4.22–5.81)
WBC: 10 10*3/uL (ref 4.0–10.5)

## 2011-08-29 LAB — PROTIME-INR: INR: 0.94 (ref 0.00–1.49)

## 2011-08-29 LAB — URINALYSIS, ROUTINE W REFLEX MICROSCOPIC
Glucose, UA: NEGATIVE mg/dL
Protein, ur: NEGATIVE mg/dL
Specific Gravity, Urine: 1.018 (ref 1.005–1.030)
Urobilinogen, UA: 1 mg/dL (ref 0.0–1.0)

## 2011-08-29 LAB — APTT: aPTT: 26 seconds (ref 24–37)

## 2011-08-29 NOTE — Progress Notes (Signed)
HPI patient returns today for followup. We plan to do his surgery on October 22. We'll do a left VATS with resection of the left upper lobe lesion and wedge resection of the left lower lobe lesion. Pulmonary function tests showed an FVC of 3.76 or 68% of predicted and an FEV1 of 2.48 or 59% predicted. Both improved with bronchodilators the diffusion capacity was 88%. The risk of the procedure was explained he the patient and his family. The chance of success was also explained at this is a moderately risk operation. The patient understand the risks and agrees to the surgery. I have a good chance to resect both of these lesions.   Current Outpatient Prescriptions  Medication Sig Dispense Refill  . albuterol (PROVENTIL HFA;VENTOLIN HFA) 108 (90 BASE) MCG/ACT inhaler Inhale 2 puffs into the lungs every 6 (six) hours as needed.           Review of Systems: Unchanged   Physical Exam lungs are clear to auscultation percussion heart regular sinus rhythm   Diagnostic Tests a PET scan showed increased uptake in the left upper lobe lesion in no uptake in the left lower lobe lesion no uptake in the lymph nodes.Impression: Non-small cell possible small cell lung cancer left upper lobe status post chemoradiation   Plan: Left VATS and left upper lobe and possible left lower lobe nodule with node dissection.

## 2011-09-02 ENCOUNTER — Ambulatory Visit (HOSPITAL_COMMUNITY)
Admission: RE | Admit: 2011-09-02 | Discharge: 2011-09-02 | Disposition: A | Discharge status: Home / Self Care | Payer: BC Managed Care – PPO | Source: Ambulatory Visit | Attending: Thoracic Surgery | Admitting: Thoracic Surgery

## 2011-09-02 ENCOUNTER — Other Ambulatory Visit: Payer: Self-pay | Admitting: Thoracic Surgery

## 2011-09-02 ENCOUNTER — Inpatient Hospital Stay (HOSPITAL_COMMUNITY): Payer: BC Managed Care – PPO

## 2011-09-02 ENCOUNTER — Inpatient Hospital Stay (HOSPITAL_COMMUNITY)
Admission: RE | Admit: 2011-09-02 | Discharge: 2011-09-06 | DRG: 075 | Disposition: A | Discharge status: Home / Self Care | Payer: BC Managed Care – PPO | Source: Ambulatory Visit | Attending: Thoracic Surgery | Admitting: Thoracic Surgery

## 2011-09-02 DIAGNOSIS — F17200 Nicotine dependence, unspecified, uncomplicated: Secondary | ICD-10-CM | POA: Diagnosis present

## 2011-09-02 DIAGNOSIS — I10 Essential (primary) hypertension: Secondary | ICD-10-CM | POA: Diagnosis present

## 2011-09-02 DIAGNOSIS — C3490 Malignant neoplasm of unspecified part of unspecified bronchus or lung: Secondary | ICD-10-CM

## 2011-09-02 DIAGNOSIS — C3410 Malignant neoplasm of upper lobe, unspecified bronchus or lung: Principal | ICD-10-CM | POA: Diagnosis present

## 2011-09-02 DIAGNOSIS — E7800 Pure hypercholesterolemia, unspecified: Secondary | ICD-10-CM | POA: Diagnosis present

## 2011-09-02 DIAGNOSIS — Z01812 Encounter for preprocedural laboratory examination: Secondary | ICD-10-CM

## 2011-09-02 DIAGNOSIS — J4489 Other specified chronic obstructive pulmonary disease: Secondary | ICD-10-CM | POA: Diagnosis present

## 2011-09-02 DIAGNOSIS — J449 Chronic obstructive pulmonary disease, unspecified: Secondary | ICD-10-CM | POA: Diagnosis present

## 2011-09-02 HISTORY — PX: OTHER SURGICAL HISTORY: SHX169

## 2011-09-02 SURGERY — Surgical Case
Anesthesia: *Unknown

## 2011-09-03 ENCOUNTER — Inpatient Hospital Stay (HOSPITAL_COMMUNITY): Payer: BC Managed Care – PPO

## 2011-09-03 LAB — TYPE AND SCREEN
Antibody Screen: NEGATIVE
Unit division: 0

## 2011-09-03 LAB — BASIC METABOLIC PANEL
BUN: 6 mg/dL (ref 6–23)
GFR calc Af Amer: >90 mL/min (ref >90)
GFR calc non Af Amer: >90 mL/min (ref >90)
Potassium: 3.4 mEq/L — ABNORMAL LOW (ref 3.5–5.1)
Sodium: 136 mEq/L (ref 135–145)

## 2011-09-03 LAB — CBC
Platelets: 170 10*3/uL (ref 150–400)
RBC: 4.27 MIL/uL (ref 4.22–5.81)
RDW: 15.5 % (ref 11.5–15.5)
WBC: 11.4 10*3/uL — ABNORMAL HIGH (ref 4.0–10.5)

## 2011-09-03 LAB — POCT I-STAT 3, ART BLOOD GAS (G3+)
Acid-Base Excess: 3 mmol/L — ABNORMAL HIGH (ref 0.0–2.0)
Patient temperature: 98.7
pO2, Arterial: 81 mmHg (ref 80.0–100.0)

## 2011-09-04 ENCOUNTER — Inpatient Hospital Stay (HOSPITAL_COMMUNITY): Payer: BC Managed Care – PPO

## 2011-09-04 LAB — COMPREHENSIVE METABOLIC PANEL
AST: 16 U/L (ref 0–37)
Albumin: 3.1 g/dL — ABNORMAL LOW (ref 3.5–5.2)
Alkaline Phosphatase: 65 U/L (ref 39–117)
Chloride: 99 mEq/L (ref 96–112)
Potassium: 3.6 mEq/L (ref 3.5–5.1)
Sodium: 134 mEq/L — ABNORMAL LOW (ref 135–145)
Total Bilirubin: 0.7 mg/dL (ref 0.3–1.2)

## 2011-09-04 LAB — CBC
Platelets: 180 10*3/uL (ref 150–400)
RDW: 15.1 % (ref 11.5–15.5)
WBC: 12.1 10*3/uL — ABNORMAL HIGH (ref 4.0–10.5)

## 2011-09-05 ENCOUNTER — Inpatient Hospital Stay (HOSPITAL_COMMUNITY): Payer: BC Managed Care – PPO

## 2011-09-05 ENCOUNTER — Other Ambulatory Visit: Payer: Self-pay | Admitting: Thoracic Surgery

## 2011-09-05 DIAGNOSIS — C3410 Malignant neoplasm of upper lobe, unspecified bronchus or lung: Secondary | ICD-10-CM

## 2011-09-05 LAB — BASIC METABOLIC PANEL
CO2: 28 mEq/L (ref 19–32)
Chloride: 98 mEq/L (ref 96–112)
Creatinine, Ser: 0.73 mg/dL (ref 0.50–1.35)
GFR calc Af Amer: >90 mL/min (ref >90)
Sodium: 133 mEq/L — ABNORMAL LOW (ref 135–145)

## 2011-09-05 LAB — CBC
HCT: 34.8 % — ABNORMAL LOW (ref 39.0–52.0)
Hemoglobin: 11.4 g/dL — ABNORMAL LOW (ref 13.0–17.0)
MCH: 28.6 pg (ref 26.0–34.0)
MCHC: 32.8 g/dL (ref 30.0–36.0)
Platelets: 186 10*3/uL (ref 150–400)
RDW: 14.5 % (ref 11.5–15.5)

## 2011-09-05 LAB — TISSUE CULTURE

## 2011-09-06 ENCOUNTER — Inpatient Hospital Stay (HOSPITAL_COMMUNITY): Payer: BC Managed Care – PPO

## 2011-09-10 DIAGNOSIS — C3490 Malignant neoplasm of unspecified part of unspecified bronchus or lung: Secondary | ICD-10-CM | POA: Insufficient documentation

## 2011-09-11 ENCOUNTER — Ambulatory Visit
Admission: RE | Admit: 2011-09-11 | Discharge: 2011-09-11 | Disposition: A | Discharge status: Home / Self Care | Payer: BC Managed Care – PPO | Source: Ambulatory Visit | Attending: Thoracic Surgery | Admitting: Thoracic Surgery

## 2011-09-11 ENCOUNTER — Ambulatory Visit (INDEPENDENT_AMBULATORY_CARE_PROVIDER_SITE_OTHER): Payer: Self-pay | Admitting: Thoracic Surgery

## 2011-09-11 ENCOUNTER — Encounter: Payer: Self-pay | Admitting: Thoracic Surgery

## 2011-09-11 VITALS — BP 120/80 | HR 64 | Resp 16

## 2011-09-11 DIAGNOSIS — C3410 Malignant neoplasm of upper lobe, unspecified bronchus or lung: Secondary | ICD-10-CM

## 2011-09-11 NOTE — Op Note (Signed)
[name] [name], [name] [name]                 ACCOUNT NO.:  [id number]  MEDICAL RECORD NO.:  [id number]  LOCATION:  2310                         FACILITY:  [facility]  PHYSICIAN:  Ines Bloomer, M.D. DATE OF BIRTH:  1956-05-10  DATE OF PROCEDURE:  09/02/2011 DATE OF DISCHARGE:                              OPERATIVE REPORT   PREOPERATIVE DIAGNOSIS:  Small-cell and non-small-cell cancer left upper lobe, status post chemotherapy.  POSTOPERATIVE DIAGNOSIS:  Small-cell and non-small-cell cancer left upper lobe, status post chemotherapy.  OPERATION PERFORMED:  Left video-assisted thoracoscopy, mini thoracotomy; wedge, posterior segment of left upper lobe; wedge, anterior segment of left lower lobe with node dissection.  SURGEON:  Ines Bloomer, MD  FIRST ASSISTANT:  __________  ANESTHESIA:  General anesthesia.  INDICATIONS:  This patient had a left upper lobe mass and underwent a needle biopsy that showed evidence of squamous cell and evidence of non- small cell lung cancer with poorly differentiated; however, the nodes were negative.  She underwent chemotherapy and had a marked response to 3-4 cycles of chemotherapy, and he also had a small lesion in the superior segment of the left lower lobe, that is essentially completely disappeared with chemotherapy.  We decided to do a wedge resection of these to the remaining tumor.  DESCRIPTION OF PROCEDURE:  After general anesthesia, the patient was turned to the left lateral thoracotomy position.  She was prepped and draped in the usual sterile manner.  Dual-lumen tube was inserted.  Two trocar sites were made in the anterior and posterior axillary line at the seventh intercostal space.  Two trocars were inserted.  The left lung was inflated.  We could see the adhesions to the left upper lobe and no adhesions in the left lower lobe after inserting the zero-degree scope.  We made an incision over the triangle of auscultation dividing the  latissimus and reflecting the serratus anteriorly.  Two trocar sites were made and the lesion was identified in the left upper lobe.  We then came in with a Covidien stapler and used several applications to resect it.  We sent it for frozen section and those were all necrotic.  There was a question of some atypical cells at the staple line, so we took a second margin for stapler line using the Covidien black stapler.  We then turned our attention to the superior segment of the left lower lobe.  We did not palpate a discrete lesion or discrete scar tissue, but in the area where the lesion was we resected across it with several applications of Covidien stapler.  We then dissected out the aortopulmonary window nodes, these #5 nodes, the inferior pulmonary ligament nodes and took down the inferior pulmonary ligament and dissected out hilar 10 L nodes and then dissected into the fissure dissecting out some 11 L nodes.  All bleedings were electrocoagulated.  ProGel was applied to the staple lines.  A single On-Q was inserted in the usual fashion.  Chest was closed with 2 pericostal.  #1 Vicryl in the muscle layer, 2-0 Vicryl in subcutaneous tissue, and Dermabond for the skin.  The patient was returned to recovery room in stable condition.  Ines Bloomer, M.D.     DPB/MEDQ  D:  09/02/2011  T:  09/02/2011  Job:  [id number]  Electronically Signed by Jovita Gamma M.D. on 09/11/2011 05:00:44 PM

## 2011-09-11 NOTE — Progress Notes (Signed)
HPI the patient returns after having a left upper lobe posterior segmentectomy and superior segmentectomy his incision is well-healed removed his chest tube sutures chest x-ray showed normal postoperative changes. He is having some moderate pain. We gave him a refill for Percocet #65/325. He will gradually increase his activities. I will have him follow up with Dr. Gwenyth Bouillon.   Current Outpatient Prescriptions  Medication Sig Dispense Refill  . albuterol (PROVENTIL HFA;VENTOLIN HFA) 108 (90 BASE) MCG/ACT inhaler Inhale 2 puffs into the lungs every 6 (six) hours as needed.        . metoprolol tartrate (LOPRESSOR) 25 MG tablet Take 25 mg by mouth 2 (two) times daily. 1/2 TAB PO BID       . oxycodone (OXY-IR) 5 MG capsule Take 5 mg by mouth every 4 (four) hours as needed.           Review of Systems: Unchanged   Physical Exam  Cardiovascular: Normal rate, regular rhythm, normal heart sounds and intact distal pulses.   Pulmonary/Chest: Breath sounds normal. No respiratory distress.   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

## 2011-09-16 ENCOUNTER — Telehealth: Payer: Self-pay | Admitting: *Deleted

## 2011-09-17 ENCOUNTER — Telehealth: Payer: Self-pay | Admitting: *Deleted

## 2011-09-18 ENCOUNTER — Other Ambulatory Visit: Payer: Self-pay | Admitting: Thoracic Surgery

## 2011-09-18 DIAGNOSIS — C3410 Malignant neoplasm of upper lobe, unspecified bronchus or lung: Secondary | ICD-10-CM

## 2011-09-21 ENCOUNTER — Encounter: Payer: Self-pay | Admitting: *Deleted

## 2011-09-24 ENCOUNTER — Ambulatory Visit
Admission: RE | Admit: 2011-09-24 | Discharge: 2011-09-24 | Disposition: A | Discharge status: Home / Self Care | Payer: BC Managed Care – PPO | Source: Ambulatory Visit | Attending: Thoracic Surgery | Admitting: Thoracic Surgery

## 2011-09-24 ENCOUNTER — Ambulatory Visit (INDEPENDENT_AMBULATORY_CARE_PROVIDER_SITE_OTHER): Payer: Self-pay | Admitting: Thoracic Surgery

## 2011-09-24 VITALS — Ht 73.0 in | Wt 230.0 lb

## 2011-09-24 DIAGNOSIS — C3410 Malignant neoplasm of upper lobe, unspecified bronchus or lung: Secondary | ICD-10-CM

## 2011-09-24 NOTE — Progress Notes (Signed)
HPI patient returns for followup he still having a moderate amount of pain. Incisions are well-healed. He'll see Dr. Gwenyth Bouillon next week. I will we gave him a refill for oxycodone IR #60. I told him to gradually increase his activities. I will see him back again in 6 weeks.   Current Outpatient Prescriptions  Medication Sig Dispense Refill  . albuterol (PROVENTIL HFA;VENTOLIN HFA) 108 (90 BASE) MCG/ACT inhaler Inhale 2 puffs into the lungs every 6 (six) hours as needed.        Marland Kitchen HYDROcodone-acetaminophen (LORTAB) 7.5-500 MG per tablet Take 1 tablet by mouth every 4 (four) hours as needed. Take i-2 every 4-6hrs prn pain       . metoprolol tartrate (LOPRESSOR) 25 MG tablet Take 25 mg by mouth 2 (two) times daily. 1/2 TAB PO BID       . oxycodone (OXY-IR) 5 MG capsule Take 5 mg by mouth every 4 (four) hours as needed.        . prochlorperazine (COMPAZINE) 10 MG tablet Take 10 mg by mouth every 6 (six) hours as needed.           Review of Systems: Unchanged   Physical Exam  Cardiovascular: Normal rate, regular rhythm and normal heart sounds.   Pulmonary/Chest: Effort normal and breath sounds normal. No respiratory distress.     Diagnostic Tests: Chest x-ray shows normal postoperative change   Impression: Status post resection left upper lobe non-small cell lung cancer   Plan: Followup in 6 weeks with chest x-ray

## 2011-09-25 ENCOUNTER — Telehealth: Payer: Self-pay | Admitting: Internal Medicine

## 2011-09-25 ENCOUNTER — Ambulatory Visit (HOSPITAL_BASED_OUTPATIENT_CLINIC_OR_DEPARTMENT_OTHER): Payer: BC Managed Care – PPO | Admitting: Internal Medicine

## 2011-09-25 VITALS — BP 126/86 | HR 81 | Temp 98.4°F | Ht 73.0 in | Wt 231.0 lb

## 2011-09-25 DIAGNOSIS — C3490 Malignant neoplasm of unspecified part of unspecified bronchus or lung: Secondary | ICD-10-CM

## 2011-09-25 DIAGNOSIS — R071 Chest pain on breathing: Secondary | ICD-10-CM

## 2011-09-25 DIAGNOSIS — C7A1 Malignant poorly differentiated neuroendocrine tumors: Secondary | ICD-10-CM

## 2011-09-25 NOTE — Progress Notes (Signed)
No images are attached to the encounter. No scans are attached to the encounter. No scans are attached to the encounter. [facility] OFFICE PROGRESS NOTE  [name] Nakayama, MD Individual [address] [place] [address] 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  PRIOR THERAPY: 1) Status post 3 cycles of systemic chemotherapy with carboplatin and etoposide.  Last dose was given on 07/16/2011. 2) Left video-assisted thoracoscopy, mini thoracotomy; wedge, posterior segment of left upper lobe; wedge, anterior segment of left lower lobe with node dissection.   CURRENT THERAPY: None  INTERVAL HISTORY: [name] [name] 55 y.o. male returns to the clinic today accompanied his wife for followup visit. The patient underwent left VATS, with wedge resection of the posterior segment of the left upper lobe and wedge resection of the anterior segment of the left lower lobe with node dissection. 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 No lymphovascular invasion and the lymph nodes were negative for malignancy. The patient came for followup visit and discussion of adjuvant chemotherapy. He is feeling fine except for soreness in the left side of his chest. He denied having any significant shortness breath. No cough or hemoptysis. Unfortunately he stats smoking again. No other significant complaints.   MEDICAL HISTORY: Past Medical History  Diagnosis Date  . HTN (hypertension)   . Hypercholesteremia   . Lung cancer   . COPD (chronic obstructive pulmonary disease)     ALLERGIES:   has no known allergies.  MEDICATIONS:  Current Outpatient Prescriptions  Medication Sig Dispense Refill  . albuterol (PROVENTIL HFA;VENTOLIN HFA) 108 (90 BASE) MCG/ACT inhaler Inhale 2 puffs into the  lungs every 6 (six) hours as needed.        Marland Kitchen HYDROcodone-acetaminophen (LORTAB) 7.5-500 MG per tablet Take 1 tablet by mouth every 4 (four) hours as needed. Take i-2 every 4-6hrs prn pain      . metoprolol tartrate (LOPRESSOR) 25 MG tablet Take 25 mg by mouth 2 (two) times daily. 1/2 TAB PO BID       . oxycodone (OXY-IR) 5 MG capsule Take 5 mg by mouth every 4 (four) hours as needed.       . prochlorperazine (COMPAZINE) 10 MG tablet Take 10 mg by mouth every 6 (six) hours as needed.          SURGICAL HISTORY:  Past Surgical History  Procedure Date  . Left video-assisted thoracoscopy, mini thoracotomy; wedge, posterior segment of left upper lobe; wedge, anterior segment of left lower lobe with node dissection. 09/02/11    [facility]    REVIEW OF SYSTEMS:  A comprehensive review of systems was negative except for: Respiratory: positive for soreness in left side of the chest.   PHYSICAL EXAMINATION: General appearance: alert, cooperative and no distress Head: Normocephalic, without obvious abnormality, atraumatic Neck: no adenopathy Lymph nodes: Cervical, supraclavicular, and axillary nodes normal. Resp: clear to auscultation bilaterally Cardio: regular rate and rhythm, S1, S2 normal, no murmur, click, rub or gallop GI: soft, non-tender; bowel sounds normal; no masses,  no organomegaly Extremities: extremities normal, atraumatic, no cyanosis or edema Neurologic: Alert and oriented X 3, normal strength and tone. Normal symmetric reflexes. Normal coordination and gait  ECOG PERFORMANCE STATUS: 1 - Symptomatic but completely ambulatory  Blood pressure 126/86, pulse 81, temperature 98.4 F (36.9 C), temperature source Oral,  height 6\' 1"  (1.854 m), weight 231 lb (104.781 kg).  LABORATORY DATA: Lab Results  Component Value Date   WBC 9.7 09/05/2011   HGB 11.4* 09/05/2011   HCT 34.8* 09/05/2011   MCV 87.4 09/05/2011   PLT 186 09/05/2011      Chemistry      Component Value Date/Time    NA 133* 09/05/2011 0415   K 4.1 09/05/2011 0415   CL 98 09/05/2011 0415   CO2 28 09/05/2011 0415   BUN 6 09/05/2011 0415   CREATININE 0.73 09/05/2011 0415      Component Value Date/Time   CALCIUM 9.2 09/05/2011 0415   ALKPHOS 65 09/04/2011 0345   AST 16 09/04/2011 0345   ALT 15 09/04/2011 0345   BILITOT 0.7 09/04/2011 0345       RADIOGRAPHIC STUDIES: Dg Chest 2 View  09/24/2011  *RADIOLOGY REPORT*  Clinical Data: History of lung carcinoma, follow-up  CHEST - 2 VIEW  Comparison: Chest x-ray of 09/11/2011  Findings: Pleural and parenchymal opacity on the left is stable postoperatively.  The right lung is clear.  There may still be a tiny left pleural effusion present.  Heart size is stable.  No bony abnormality is seen.  IMPRESSION: Stable pleural and parenchymal opacity with some volume loss on the left.  Probable tiny left effusion.  Original Report Authenticated By: Juline Patch, M.D.    ASSESSMENT: This is a very pleasant 55 years old white male with history of a stage IB with mixed non-small cell lung cancer, and small cell lung cancer. The patient is status post 3 cycles of chemotherapy with carboplatin and etoposide followed by surgical resection. He is doing fine today. And the final pathology showed only 0.5 CM viable tumor. I discussed the results with the patient.   PLAN: I plan for him continuous observation for now. There is no benefit for adjuvant chemotherapy at this point. I would see the patient back for followup visit in 3 months with repeat CT scan of the chest with contrast.  All questions were answered. The patient knows to call the clinic with any problems, questions or concerns. We can certainly see the patient much sooner if necessary.

## 2011-09-25 NOTE — Progress Notes (Signed)
Pain 5/10 in left side r/t surgical resection with Dr Edwyna Shell, pt is currently on pain meds that "keep pain under control", will inform Dr Donnald Garre.  SLJ

## 2011-09-25 NOTE — Telephone Encounter (Signed)
gv pt appt schedule for feb and ct scan for 2/7 @ 10:30 am @ [facility].

## 2011-10-01 LAB — FUNGUS CULTURE W SMEAR: Fungal Smear: NONE SEEN

## 2011-10-16 LAB — AFB CULTURE WITH SMEAR (NOT AT ARMC): Acid Fast Smear: NONE SEEN

## 2011-10-22 ENCOUNTER — Other Ambulatory Visit: Payer: Self-pay

## 2011-10-22 DIAGNOSIS — C3490 Malignant neoplasm of unspecified part of unspecified bronchus or lung: Secondary | ICD-10-CM

## 2011-10-22 DIAGNOSIS — G8918 Other acute postprocedural pain: Secondary | ICD-10-CM

## 2011-10-22 MED ORDER — HYDROCODONE-ACETAMINOPHEN 7.5-500 MG PO TABS: 1.0000 | ORAL_TABLET | Freq: Four times a day (QID) | ORAL | Status: DC | PRN

## 2011-10-22 NOTE — Telephone Encounter (Signed)
Rx sent to [facility] [place], pt notified.

## 2011-11-06 ENCOUNTER — Other Ambulatory Visit: Payer: Self-pay | Admitting: Thoracic Surgery

## 2011-11-06 DIAGNOSIS — C3410 Malignant neoplasm of upper lobe, unspecified bronchus or lung: Secondary | ICD-10-CM

## 2011-11-13 ENCOUNTER — Ambulatory Visit (INDEPENDENT_AMBULATORY_CARE_PROVIDER_SITE_OTHER): Payer: Self-pay | Admitting: Thoracic Surgery

## 2011-11-13 ENCOUNTER — Ambulatory Visit
Admission: RE | Admit: 2011-11-13 | Discharge: 2011-11-13 | Disposition: A | Discharge status: Home / Self Care | Payer: BC Managed Care – PPO | Source: Ambulatory Visit | Attending: Thoracic Surgery | Admitting: Thoracic Surgery

## 2011-11-13 ENCOUNTER — Encounter: Payer: Self-pay | Admitting: Thoracic Surgery

## 2011-11-13 VITALS — BP 158/105 | HR 82 | Resp 20 | Ht 73.0 in | Wt 230.0 lb

## 2011-11-13 DIAGNOSIS — C3410 Malignant neoplasm of upper lobe, unspecified bronchus or lung: Secondary | ICD-10-CM

## 2011-11-13 DIAGNOSIS — Z9889 Other specified postprocedural states: Secondary | ICD-10-CM

## 2011-11-13 NOTE — Progress Notes (Signed)
HPI patient returns today his chest x-ray shows normal postoperative changes. He'll be seen [facility] in February. He still complains of some chest wall pain and shortness of breath. He is getting worse prescription for Vicodin 7.5/325 #60 with one refill. OTO again in 2 months with a chest x-ray Current Outpatient Prescriptions  Medication Sig Dispense Refill  . albuterol (PROVENTIL HFA;VENTOLIN HFA) 108 (90 BASE) MCG/ACT inhaler Inhale 2 puffs into the lungs every 6 (six) hours as needed.        Marland Kitchen HYDROcodone-acetaminophen (LORTAB) 7.5-500 MG per tablet Take 1 tablet by mouth every 6 (six) hours as needed for pain. Take i-2 every 4-6hrs prn pain  40 tablet  0  . metoprolol tartrate (LOPRESSOR) 25 MG tablet Take 25 mg by mouth 2 (two) times daily. 1/2 TAB PO BID          Review of Systems: Recent flu   Physical Exam lungs were clear to auscultation percussion   Diagnostic Tests: Chest x-ray showed normal postoperative changes   Impression: Left upper lobectomy for stage Ia a non-small cell lung cancer   Plan: Followup in 2 months

## 2011-12-18 ENCOUNTER — Other Ambulatory Visit: Payer: Self-pay | Admitting: Internal Medicine

## 2011-12-18 DIAGNOSIS — C3490 Malignant neoplasm of unspecified part of unspecified bronchus or lung: Secondary | ICD-10-CM

## 2011-12-19 ENCOUNTER — Encounter (HOSPITAL_COMMUNITY): Payer: Self-pay

## 2011-12-19 ENCOUNTER — Ambulatory Visit (HOSPITAL_COMMUNITY)
Admission: RE | Admit: 2011-12-19 | Discharge: 2011-12-19 | Disposition: A | Discharge status: Home / Self Care | Payer: BC Managed Care – PPO | Source: Ambulatory Visit | Attending: Internal Medicine | Admitting: Internal Medicine

## 2011-12-19 ENCOUNTER — Other Ambulatory Visit (HOSPITAL_BASED_OUTPATIENT_CLINIC_OR_DEPARTMENT_OTHER): Payer: BC Managed Care – PPO | Admitting: Lab

## 2011-12-19 DIAGNOSIS — Z9221 Personal history of antineoplastic chemotherapy: Secondary | ICD-10-CM | POA: Insufficient documentation

## 2011-12-19 DIAGNOSIS — I2510 Atherosclerotic heart disease of native coronary artery without angina pectoris: Secondary | ICD-10-CM | POA: Insufficient documentation

## 2011-12-19 DIAGNOSIS — R911 Solitary pulmonary nodule: Secondary | ICD-10-CM | POA: Insufficient documentation

## 2011-12-19 DIAGNOSIS — Z902 Acquired absence of lung [part of]: Secondary | ICD-10-CM | POA: Insufficient documentation

## 2011-12-19 DIAGNOSIS — C3490 Malignant neoplasm of unspecified part of unspecified bronchus or lung: Secondary | ICD-10-CM

## 2011-12-19 DIAGNOSIS — I700 Atherosclerosis of aorta: Secondary | ICD-10-CM | POA: Insufficient documentation

## 2011-12-19 LAB — CMP (CANCER CENTER ONLY)
ALT(SGPT): 39 U/L (ref 10–47)
AST: 23 U/L (ref 11–38)
Albumin: 4 g/dL (ref 3.3–5.5)
Alkaline Phosphatase: 85 U/L — ABNORMAL HIGH (ref 26–84)
Glucose, Bld: 103 mg/dL (ref 73–118)
Potassium: 4.5 mEq/L (ref 3.3–4.7)
Sodium: 145 mEq/L (ref 128–145)
Total Protein: 8 g/dL (ref 6.4–8.1)

## 2011-12-19 LAB — CBC WITH DIFFERENTIAL/PLATELET
EOS%: 2.6 % (ref 0.0–7.0)
Eosinophils Absolute: 0.2 10*3/uL (ref 0.0–0.5)
MCV: 81.8 fL (ref 79.3–98.0)
MONO%: 8.9 % (ref 0.0–14.0)
NEUT#: 4.9 10*3/uL (ref 1.5–6.5)
RBC: 5.52 10*6/uL (ref 4.20–5.82)
RDW: 15.1 % — ABNORMAL HIGH (ref 11.0–14.6)

## 2011-12-19 MED ORDER — IOHEXOL 300 MG/ML  SOLN
80.0000 mL | Freq: Once | INTRAMUSCULAR | Status: AC | PRN
  Administered 2011-12-19: 80 mL via INTRAVENOUS

## 2011-12-25 ENCOUNTER — Ambulatory Visit (HOSPITAL_BASED_OUTPATIENT_CLINIC_OR_DEPARTMENT_OTHER): Payer: BC Managed Care – PPO | Admitting: Internal Medicine

## 2011-12-25 ENCOUNTER — Other Ambulatory Visit (HOSPITAL_COMMUNITY): Payer: BC Managed Care – PPO

## 2011-12-25 ENCOUNTER — Other Ambulatory Visit: Payer: BC Managed Care – PPO | Admitting: Lab

## 2011-12-25 ENCOUNTER — Ambulatory Visit: Payer: BC Managed Care – PPO | Admitting: Lab

## 2011-12-25 ENCOUNTER — Telehealth: Payer: Self-pay | Admitting: Internal Medicine

## 2011-12-25 DIAGNOSIS — C3490 Malignant neoplasm of unspecified part of unspecified bronchus or lung: Secondary | ICD-10-CM

## 2011-12-25 DIAGNOSIS — G8918 Other acute postprocedural pain: Secondary | ICD-10-CM

## 2011-12-25 MED ORDER — HYDROCODONE-ACETAMINOPHEN 7.5-500 MG PO TABS: 1.0000 | ORAL_TABLET | Freq: Four times a day (QID) | ORAL | Status: DC | PRN

## 2011-12-25 NOTE — Progress Notes (Signed)
[facility] OFFICE PROGRESS NOTE  [name], MD, MD Individual [address] 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   PRIOR THERAPY:  1) Status post 3 cycles of systemic chemotherapy with carboplatin and etoposide. Last dose was given on 07/16/2011.  2) Left video-assisted thoracoscopy, mini thoracotomy; wedge, posterior segment of left upper lobe; wedge, anterior segment of left lower lobe with node dissection.   CURRENT THERAPY: None   INTERVAL HISTORY: [name] [name] 56 y.o. male returns to the clinic today for a routine three-month followup visit accompanied his wife. He has no complaints today except for the occasional left-sided chest pain at the surgical scar and shortness of breath with exertion. He gained several pounds since his last visit. He denied having any significant cough or hemoptysis. No nausea or vomiting. He has repeat CT scan of the chest performed recently and he is here today for evaluation and discussion of his scan results.  MEDICAL HISTORY: Past Medical History  Diagnosis Date  . HTN (hypertension)   . Hypercholesteremia   . COPD (chronic obstructive pulmonary disease)   . Lung cancer     ALLERGIES:   has no known allergies.  MEDICATIONS:  Current Outpatient Prescriptions  Medication Sig Dispense Refill  . albuterol (PROVENTIL HFA;VENTOLIN HFA) 108 (90 BASE) MCG/ACT inhaler Inhale 2 puffs into the lungs every 6 (six) hours as needed.        Marland Kitchen HYDROcodone-acetaminophen (LORTAB) 7.5-500 MG per tablet Take 1 tablet by mouth every 6 (six) hours as needed for pain. Take i-2 every 4-6hrs prn pain  40 tablet  0  . HYDROcodone-acetaminophen (LORTAB) 7.5-500 MG per tablet Take 1 tablet by mouth every 6 (six) hours as needed for pain. Take i-2 every 4-6hrs prn pain  40 tablet  0  . metoprolol tartrate (LOPRESSOR) 25  MG tablet Take 25 mg by mouth 2 (two) times daily. 1/2 TAB PO BID         SURGICAL HISTORY:  Past Surgical History  Procedure Date  . Left video-assisted thoracoscopy, mini thoracotomy; wedge, posterior segment of left upper lobe; wedge, anterior segment of left lower lobe with node dissection. 09/02/11    [facility]NEY    REVIEW OF SYSTEMS:  A comprehensive review of systems was negative except for: Respiratory: positive for dyspnea on exertion and pleurisy/chest pain   PHYSICAL EXAMINATION: General appearance: alert, cooperative and no distress Neck: no adenopathy Lymph nodes: Cervical, supraclavicular, and axillary nodes normal. Resp: clear to auscultation bilaterally Cardio: regular rate and rhythm, S1, S2 normal, no murmur, click, rub or gallop GI: soft, non-tender; bowel sounds normal; no masses,  no organomegaly Extremities: extremities normal, atraumatic, no cyanosis or edema Neurologic: Alert and oriented X 3, normal strength and tone. Normal symmetric reflexes. Normal coordination and gait  ECOG PERFORMANCE STATUS: 1 - Symptomatic but completely ambulatory  Blood pressure 148/91, pulse 77, temperature 96.2 F (35.7 C), temperature source Oral, height 6\' 1"  (1.854 m), weight 243 lb 3.2 oz (110.315 kg).  LABORATORY DATA: Lab Results  Component Value Date   WBC 8.6 12/19/2011   HGB 15.2 12/19/2011   HCT 45.1 12/19/2011   MCV 81.8 12/19/2011   PLT 242 12/19/2011      Chemistry      Component Value Date/Time   NA 145 12/19/2011 0932   NA 133* 09/05/2011 0415  K 4.5 12/19/2011 0932   K 4.1 09/05/2011 0415   CL 103 12/19/2011 0932   CL 98 09/05/2011 0415   CO2 29 12/19/2011 0932   CO2 28 09/05/2011 0415   BUN 10 12/19/2011 0932   BUN 6 09/05/2011 0415   CREATININE 1.0 12/19/2011 0932   CREATININE 0.73 09/05/2011 0415      Component Value Date/Time   CALCIUM 8.8 12/19/2011 0932   CALCIUM 9.2 09/05/2011 0415   ALKPHOS 85* 12/19/2011 0932   ALKPHOS 65 09/04/2011 0345   AST 23 12/19/2011 0932     AST 16 09/04/2011 0345   ALT 15 09/04/2011 0345   BILITOT 0.60 12/19/2011 0932   BILITOT 0.7 09/04/2011 0345       RADIOGRAPHIC STUDIES: Ct Chest W Contrast  12/19/2011  *RADIOLOGY REPORT*  Clinical Data: Lung cancer diagnosed 04/2011 status post partial lobectomy, last chemotherapy 07/2011, for restaging  CT CHEST WITH CONTRAST  Technique:  Multidetector CT imaging of the chest was performed following the standard protocol during bolus administration of intravenous contrast.  Contrast: 80mL OMNIPAQUE IOHEXOL 300 MG/ML IV SOLN  Comparison: PET CT dated 08/22/2011.  Findings: Interval left upper lobe and left lower lobe wedge resections.  Scattered faint ground-glass opacities in the right upper lobe (series 5/images 11 and 21), right lower lobe (series 5/image 29), and left lower lobe (series 5/image 43).    Faint 4 mm left lower lobe nodule (series 5/image 35), possibly new.  No pleural effusion or pneumothorax.  Visualized thyroid is unremarkable.  The heart is normal in size.  No pericardial effusion.  Coronary atherosclerosis.  Atherosclerotic calcifications of the aortic arch.  Small mediastinal lymph nodes which do not meet pathologic CT size criteria.  No suspicious hilar or axillary lymphadenopathy.  Visualized upper abdomen is unremarkable.  Mild degenerative changes of the visualized thoracolumbar spine.  IMPRESSION: Status post left upper and lower lobe wedge resections.  No definite evidence of recurrent/metastatic disease.  4 mm faint left lower lobe nodule, possibly new, attention on follow-up suggested.  Scattered faint ground-glass opacities bilaterally, as described above, grossly unchanged.  Original Report Authenticated By: Charline Bills, M.D.    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 The patient is doing fine and he has no evidence for disease recurrence except for questionable small nodule in the left lower lobe.   PLAN: I discussed the scan results and showed the images with the patient and his wife. I recommended for him continuous observation for now with repeat CT scan of the chest with contrast in 3 months. The patient was advised to call me immediately if he has any worsening symptoms in the interval.   All questions were answered. The patient knows to call the clinic with any problems, questions or concerns. We can certainly see the patient much sooner if necessary.

## 2011-12-25 NOTE — Telephone Encounter (Signed)
appts made and printed for 5/15 and 5/20  aom

## 2011-12-30 ENCOUNTER — Other Ambulatory Visit: Payer: BC Managed Care – PPO | Admitting: Lab

## 2012-01-15 ENCOUNTER — Encounter: Payer: Self-pay | Admitting: Thoracic Surgery

## 2012-01-15 ENCOUNTER — Other Ambulatory Visit: Payer: Self-pay | Admitting: Thoracic Surgery

## 2012-01-15 ENCOUNTER — Ambulatory Visit (INDEPENDENT_AMBULATORY_CARE_PROVIDER_SITE_OTHER): Payer: BC Managed Care – PPO | Admitting: Thoracic Surgery

## 2012-01-15 ENCOUNTER — Ambulatory Visit
Admission: RE | Admit: 2012-01-15 | Discharge: 2012-01-15 | Disposition: A | Discharge status: Home / Self Care | Payer: BC Managed Care – PPO | Source: Ambulatory Visit | Attending: Thoracic Surgery | Admitting: Thoracic Surgery

## 2012-01-15 VITALS — BP 140/94 | HR 93 | Resp 16 | Ht 73.0 in | Wt 244.0 lb

## 2012-01-15 DIAGNOSIS — Z09 Encounter for follow-up examination after completed treatment for conditions other than malignant neoplasm: Secondary | ICD-10-CM

## 2012-01-15 DIAGNOSIS — C3410 Malignant neoplasm of upper lobe, unspecified bronchus or lung: Secondary | ICD-10-CM

## 2012-01-15 DIAGNOSIS — C3490 Malignant neoplasm of unspecified part of unspecified bronchus or lung: Secondary | ICD-10-CM

## 2012-01-15 NOTE — Progress Notes (Signed)
HPI Mr. returns for followup today. He still having some chest wall soreness. His incisions are well-healed. Chest x-ray shows normal postoperative changes. He had a CT scan in February that was negative. He has another one scheduled in may. we will see him back again at that time. Current Outpatient Prescriptions  Medication Sig Dispense Refill  . albuterol (PROVENTIL HFA;VENTOLIN HFA) 108 (90 BASE) MCG/ACT inhaler Inhale 2 puffs into the lungs every 6 (six) hours as needed.        Marland Kitchen HYDROcodone-acetaminophen (LORTAB) 7.5-500 MG per tablet Take 1 tablet by mouth every 6 (six) hours as needed for pain. Take i-2 every 4-6hrs prn pain  40 tablet  0  . metoprolol tartrate (LOPRESSOR) 25 MG tablet Take 25 mg by mouth 2 (two) times daily. 1/2 TAB PO BID          Review of Systems chest wall soreness.:   Physical Exam lungs are clear to auscultation and percussion  Diagnostic Tests: Chest x-ray showed normal postoperative changes   Impression: Status post resection of the small cell lung cancer status post chemotherapy   Plan: Return in may after CT scan

## 2012-02-26 ENCOUNTER — Other Ambulatory Visit: Payer: Self-pay | Admitting: *Deleted

## 2012-02-26 DIAGNOSIS — G8918 Other acute postprocedural pain: Secondary | ICD-10-CM

## 2012-02-26 MED ORDER — HYDROCODONE-ACETAMINOPHEN 5-500 MG PO TABS: 1.0000 | ORAL_TABLET | Freq: Four times a day (QID) | ORAL | Status: AC | PRN

## 2012-03-25 ENCOUNTER — Ambulatory Visit (HOSPITAL_COMMUNITY)
Admission: RE | Admit: 2012-03-25 | Discharge: 2012-03-25 | Disposition: A | Discharge status: Home / Self Care | Payer: BC Managed Care – PPO | Source: Ambulatory Visit | Attending: Internal Medicine | Admitting: Internal Medicine

## 2012-03-25 ENCOUNTER — Other Ambulatory Visit (HOSPITAL_BASED_OUTPATIENT_CLINIC_OR_DEPARTMENT_OTHER): Payer: BC Managed Care – PPO

## 2012-03-25 DIAGNOSIS — C3490 Malignant neoplasm of unspecified part of unspecified bronchus or lung: Secondary | ICD-10-CM

## 2012-03-25 DIAGNOSIS — R911 Solitary pulmonary nodule: Secondary | ICD-10-CM | POA: Insufficient documentation

## 2012-03-25 DIAGNOSIS — R0789 Other chest pain: Secondary | ICD-10-CM | POA: Insufficient documentation

## 2012-03-25 DIAGNOSIS — R05 Cough: Secondary | ICD-10-CM | POA: Insufficient documentation

## 2012-03-25 DIAGNOSIS — R059 Cough, unspecified: Secondary | ICD-10-CM | POA: Insufficient documentation

## 2012-03-25 DIAGNOSIS — R0602 Shortness of breath: Secondary | ICD-10-CM | POA: Insufficient documentation

## 2012-03-25 DIAGNOSIS — G8918 Other acute postprocedural pain: Secondary | ICD-10-CM

## 2012-03-25 LAB — CBC WITH DIFFERENTIAL/PLATELET
Basophils Absolute: 0 10*3/uL (ref 0.0–0.1)
EOS%: 6.8 % (ref 0.0–7.0)
HCT: 45 % (ref 38.4–49.9)
HGB: 15 g/dL (ref 13.0–17.1)
MCH: 27.8 pg (ref 27.2–33.4)
MCV: 83.5 fL (ref 79.3–98.0)
MONO%: 11.3 % (ref 0.0–14.0)
NEUT%: 51 % (ref 39.0–75.0)
lymph#: 2.3 10*3/uL (ref 0.9–3.3)

## 2012-03-25 LAB — CMP (CANCER CENTER ONLY)
Albumin: 3.6 g/dL (ref 3.3–5.5)
Alkaline Phosphatase: 84 U/L (ref 26–84)
BUN, Bld: 11 mg/dL (ref 7–22)
Glucose, Bld: 99 mg/dL (ref 73–118)
Potassium: 4.2 mEq/L (ref 3.3–4.7)

## 2012-03-25 MED ORDER — IOHEXOL 300 MG/ML  SOLN
80.0000 mL | Freq: Once | INTRAMUSCULAR | Status: AC | PRN
  Administered 2012-03-25: 80 mL via INTRAVENOUS

## 2012-03-30 ENCOUNTER — Telehealth: Payer: Self-pay | Admitting: Internal Medicine

## 2012-03-30 ENCOUNTER — Ambulatory Visit (HOSPITAL_BASED_OUTPATIENT_CLINIC_OR_DEPARTMENT_OTHER): Payer: BC Managed Care – PPO | Admitting: Internal Medicine

## 2012-03-30 VITALS — BP 150/97 | HR 78 | Temp 97.0°F | Wt 245.3 lb

## 2012-03-30 DIAGNOSIS — R079 Chest pain, unspecified: Secondary | ICD-10-CM

## 2012-03-30 DIAGNOSIS — C3490 Malignant neoplasm of unspecified part of unspecified bronchus or lung: Secondary | ICD-10-CM

## 2012-03-30 DIAGNOSIS — R0602 Shortness of breath: Secondary | ICD-10-CM

## 2012-03-30 DIAGNOSIS — C3410 Malignant neoplasm of upper lobe, unspecified bronchus or lung: Secondary | ICD-10-CM

## 2012-03-30 NOTE — Telephone Encounter (Signed)
Gave pt appt for September lab and CT then see MD after a few days, instructed pt to be NPO 4 hrs prior to CT

## 2012-03-30 NOTE — Progress Notes (Signed)
[facility] [phone]   [phone]  OFFICE PROGRESS NOTE  [name], MD, MD Individual [address] [place] [address] 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   PRIOR THERAPY:  1) Status post 3 cycles of systemic chemotherapy with carboplatin and etoposide. Last dose was given on 07/16/2011.  2) Left video-assisted thoracoscopy, mini thoracotomy; wedge, posterior segment of left upper lobe; wedge, anterior segment of left lower lobe with node dissection.   CURRENT THERAPY: None.  INTERVAL HISTORY: [name] [name] 56 y.o. male returns to the clinic today for routine three-month followup. The patient has no complaints today except for occasional left-sided chest pain at the surgical scar as well as wheezes. He has shortness breath with exertion. He denied having any significant weight loss or night sweats. No nausea or vomiting.  He has repeat CT scan of the chest performed recently and he is here today for evaluation and discussion of his scan results.  MEDICAL HISTORY: Past Medical History  Diagnosis Date  . HTN (hypertension)   . Hypercholesteremia   . COPD (chronic obstructive pulmonary disease)   . Lung cancer     ALLERGIES:   has no known allergies.  MEDICATIONS:  Current Outpatient Prescriptions  Medication Sig Dispense Refill  . albuterol (PROVENTIL HFA;VENTOLIN HFA) 108 (90 BASE) MCG/ACT inhaler Inhale 2 puffs into the lungs every 6 (six) hours as needed.        . metoprolol tartrate (LOPRESSOR) 25 MG tablet Take 25 mg by mouth 2 (two) times daily. 1/2 TAB PO BID         SURGICAL HISTORY:  Past Surgical History  Procedure Date  . Left video-assisted thoracoscopy, mini thoracotomy; wedge, posterior segment of left upper lobe; wedge, anterior segment of left lower lobe with node dissection. 09/02/11    BURNEY    REVIEW  OF SYSTEMS:  A comprehensive review of systems was negative except for: Respiratory: positive for cough, dyspnea on exertion and wheezing   PHYSICAL EXAMINATION: General appearance: alert, cooperative and no distress Neck: no adenopathy Lymph nodes: Cervical, supraclavicular, and axillary nodes normal. Resp: clear to auscultation bilaterally Cardio: regular rate and rhythm, S1, S2 normal, no murmur, click, rub or gallop GI: soft, non-tender; bowel sounds normal; no masses,  no organomegaly Extremities: extremities normal, atraumatic, no cyanosis or edema Neurologic: Alert and oriented X 3, normal strength and tone. Normal symmetric reflexes. Normal coordination and gait  ECOG PERFORMANCE STATUS: 1 - Symptomatic but completely ambulatory  Blood pressure 150/97, pulse 78, temperature 97 F (36.1 C), temperature source Oral, weight 245 lb 4.8 oz (111.267 kg).  LABORATORY DATA: Lab Results  Component Value Date   WBC 7.5 03/25/2012   HGB 15.0 03/25/2012   HCT 45.0 03/25/2012   MCV 83.5 03/25/2012   PLT 236 03/25/2012      Chemistry      Component Value Date/Time   NA 138 03/25/2012 1225   NA 133* 09/05/2011 0415   K 4.2 03/25/2012 1225   K 4.1 09/05/2011 0415   CL 98 03/25/2012 1225   CL 98 09/05/2011 0415   CO2 26 03/25/2012 1225   CO2 28 09/05/2011 0415   BUN 11 03/25/2012 1225   BUN 6 09/05/2011 0415   CREATININE 1.0 03/25/2012 1225   CREATININE 0.73 09/05/2011 0415      Component Value Date/Time   CALCIUM 8.6  03/25/2012 1225   CALCIUM 9.2 09/05/2011 0415   ALKPHOS 84 03/25/2012 1225   ALKPHOS 65 09/04/2011 0345   AST 33 03/25/2012 1225   AST 16 09/04/2011 0345   ALT 15 09/04/2011 0345   BILITOT 0.80 03/25/2012 1225   BILITOT 0.7 09/04/2011 0345       RADIOGRAPHIC STUDIES: Ct Chest W Contrast  03/25/2012  *RADIOLOGY REPORT*  Clinical Data: Lung cancer, status post left upper and lower lobe wedge resections, chemotherapy complete, cough, chest pain, shortness of breath  CT  CHEST WITH CONTRAST  Technique:  Multidetector CT imaging of the chest was performed following the standard protocol during bolus administration of intravenous contrast.  Contrast: 80mL OMNIPAQUE IOHEXOL 300 MG/ML  SOLN  Comparison: CT chest dated 12/19/2011  Findings: Status post left upper and lower lobe wedge resections.  Stable faint ground-glass nodules in the right upper, lower, and left lower lobes.  No pleural effusion or pneumothorax.  Visualized thyroid is unremarkable.  The heart is normal in size.  No pericardial effusion.  Coronary atherosclerosis.  Atherosclerotic calcifications of the aortic arch.  No suspicious mediastinal, hilar, or axillary lymphadenopathy.  Visualized upper abdomen is unremarkable.  Mild degenerative changes of the visualized thoracolumbar spine.  IMPRESSION: Status post left upper and lower lobe wedge resections.  No evidence of recurrent or metastatic disease in the chest.  Stable faint ground-glass nodules in the right upper lobe and bilateral lower lobes.  Original Report Authenticated By: Charline Bills, M.D.    ASSESSMENT: This is a very pleasant 56 years old white male with history of stage Ib poorly differentiated carcinoma with features of small cell carcinoma status post 3 cycles of systemic chemotherapy with carboplatin and etoposide followed by wedge resection of the posterior segment of the left upper lobe and anterior segment of the left lower lobe with node dissection. The patient is doing fine and he has no evidence for disease recurrence.  PLAN: I discussed the scan results with the patient and recommended for him continuous observation for now with repeat CT scan of the chest in 4 months.  He was advised to take pain medication or Advil for his left sided chest pain. The patient was advised to call me immediately if he has any concerning symptoms in the interval.  All questions were answered. The patient knows to call the clinic with any problems,  questions or concerns. We can certainly see the patient much sooner if necessary.

## 2012-03-31 ENCOUNTER — Ambulatory Visit: Payer: BC Managed Care – PPO | Admitting: Thoracic Surgery

## 2012-04-01 ENCOUNTER — Ambulatory Visit (INDEPENDENT_AMBULATORY_CARE_PROVIDER_SITE_OTHER): Payer: BC Managed Care – PPO | Admitting: Thoracic Surgery

## 2012-04-01 ENCOUNTER — Encounter: Payer: Self-pay | Admitting: Thoracic Surgery

## 2012-04-01 VITALS — BP 124/76 | HR 88 | Resp 16 | Ht 73.0 in | Wt 245.0 lb

## 2012-04-01 DIAGNOSIS — Z09 Encounter for follow-up examination after completed treatment for conditions other than malignant neoplasm: Secondary | ICD-10-CM

## 2012-04-01 DIAGNOSIS — C3410 Malignant neoplasm of upper lobe, unspecified bronchus or lung: Secondary | ICD-10-CM

## 2012-04-01 NOTE — Progress Notes (Signed)
HPI patient returns today. CT scan shows no evidence of recurrence. He has completed his chemotherapy. He will be followed in 4 months by Dr. Gwenyth Bouillon. His only complaint was to some post thoracotomy pain that is mild in nature. We gave him a refill for Vicodin 5/325 #50 and as well as a prescription for Tussionex 5 cc every 12 hours. We will see him back again as needed. I told him that if his pain continues it would be happy to see him again although it probably have seen in my partners   Current Outpatient Prescriptions  Medication Sig Dispense Refill  . albuterol (PROVENTIL HFA;VENTOLIN HFA) 108 (90 BASE) MCG/ACT inhaler Inhale 2 puffs into the lungs every 6 (six) hours as needed.        Marland Kitchen guaiFENesin-dextromethorphan (ROBITUSSIN DM) 100-10 MG/5ML syrup Take 5 mLs by mouth 3 (three) times daily as needed.      . metoprolol tartrate (LOPRESSOR) 25 MG tablet Take 25 mg by mouth 2 (two) times daily. 1/2 TAB PO BID          Review of Systems: Some mild will left chest pain with activity. Mild weight gain.   Physical Exam lungs are clear auscultation percussion   Diagnostic Tests: CT scan showed no evidence of recurrence   Impression: Status post resection of left upper lobe small cell and non-small cell tumor   Plan: Return as needed followup by Dr. Gwenyth Bouillon

## 2012-06-01 ENCOUNTER — Telehealth: Payer: Self-pay | Admitting: Medical Oncology

## 2012-06-01 NOTE — Telephone Encounter (Signed)
Carollee Herter called me and asked me to contact pt about his new shoulder pain. I called pt and he reports he was helping his father in law last wed . He was using a skill saw to make a groove on 4 x 4 and shoulder started hurting that evening. " like an aggravating toothache. Left arm started tingling and has persisted. The pain has eased off after he took some left over Hydrocodone". He does not have a PCP so I told him to go to urgent care. HE voices understanding.

## 2012-07-24 ENCOUNTER — Other Ambulatory Visit (HOSPITAL_BASED_OUTPATIENT_CLINIC_OR_DEPARTMENT_OTHER): Payer: BC Managed Care – PPO | Admitting: Lab

## 2012-07-24 ENCOUNTER — Ambulatory Visit (HOSPITAL_COMMUNITY)
Admission: RE | Admit: 2012-07-24 | Discharge: 2012-07-24 | Disposition: A | Discharge status: Home / Self Care | Payer: BC Managed Care – PPO | Source: Ambulatory Visit | Attending: Internal Medicine | Admitting: Internal Medicine

## 2012-07-24 DIAGNOSIS — R059 Cough, unspecified: Secondary | ICD-10-CM | POA: Insufficient documentation

## 2012-07-24 DIAGNOSIS — C3490 Malignant neoplasm of unspecified part of unspecified bronchus or lung: Secondary | ICD-10-CM

## 2012-07-24 DIAGNOSIS — R05 Cough: Secondary | ICD-10-CM | POA: Insufficient documentation

## 2012-07-24 DIAGNOSIS — R911 Solitary pulmonary nodule: Secondary | ICD-10-CM | POA: Insufficient documentation

## 2012-07-24 DIAGNOSIS — I2510 Atherosclerotic heart disease of native coronary artery without angina pectoris: Secondary | ICD-10-CM | POA: Insufficient documentation

## 2012-07-24 DIAGNOSIS — K5900 Constipation, unspecified: Secondary | ICD-10-CM | POA: Insufficient documentation

## 2012-07-24 DIAGNOSIS — R0602 Shortness of breath: Secondary | ICD-10-CM | POA: Insufficient documentation

## 2012-07-24 LAB — COMPREHENSIVE METABOLIC PANEL (CC13)
BUN: 9 mg/dL (ref 7.0–26.0)
CO2: 21 mEq/L — ABNORMAL LOW (ref 22–29)
Calcium: 9 mg/dL (ref 8.4–10.4)
Chloride: 107 mEq/L (ref 98–107)
Creatinine: 0.9 mg/dL (ref 0.7–1.3)

## 2012-07-24 LAB — CBC WITH DIFFERENTIAL/PLATELET
BASO%: 0.3 % (ref 0.0–2.0)
Basophils Absolute: 0 10*3/uL (ref 0.0–0.1)
HCT: 44.3 % (ref 38.4–49.9)
HGB: 15.1 g/dL (ref 13.0–17.1)
MONO#: 0.7 10*3/uL (ref 0.1–0.9)
NEUT%: 53 % (ref 39.0–75.0)
WBC: 6.9 10*3/uL (ref 4.0–10.3)
lymph#: 2.2 10*3/uL (ref 0.9–3.3)

## 2012-07-24 MED ORDER — IOHEXOL 300 MG/ML  SOLN
80.0000 mL | Freq: Once | INTRAMUSCULAR | Status: AC | PRN
  Administered 2012-07-24: 80 mL via INTRAVENOUS

## 2012-07-28 ENCOUNTER — Other Ambulatory Visit: Payer: BC Managed Care – PPO | Admitting: Lab

## 2012-07-28 ENCOUNTER — Ambulatory Visit (HOSPITAL_BASED_OUTPATIENT_CLINIC_OR_DEPARTMENT_OTHER): Payer: BC Managed Care – PPO | Admitting: Internal Medicine

## 2012-07-28 ENCOUNTER — Telehealth: Payer: Self-pay | Admitting: Internal Medicine

## 2012-07-28 VITALS — BP 164/97 | HR 75 | Temp 97.0°F | Resp 18 | Ht 73.0 in | Wt 245.1 lb

## 2012-07-28 DIAGNOSIS — C7A1 Malignant poorly differentiated neuroendocrine tumors: Secondary | ICD-10-CM

## 2012-07-28 DIAGNOSIS — C3490 Malignant neoplasm of unspecified part of unspecified bronchus or lung: Secondary | ICD-10-CM

## 2012-07-28 NOTE — Progress Notes (Signed)
Pt stated he is not taking his lopressor - I told him his BP was elevated and he needs to start taking it. He does not have a pcp and I encouraged him to get one.

## 2012-07-28 NOTE — Patient Instructions (Signed)
Your scan showed no evidence for disease recurrence. Followup in 4 months.

## 2012-07-28 NOTE — Progress Notes (Signed)
[facility] Telephone:([phone]   Fax:(336) [phone]  OFFICE PROGRESS NOTE  [name]ay[place], MD Individual [address] 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   PRIOR THERAPY:  1) Status post 3 cycles of systemic chemotherapy with carboplatin and etoposide. Last dose was given on 07/16/2011.  2) Left video-assisted thoracoscopy, mini thoracotomy; wedge, posterior segment of left upper lobe; wedge, anterior segment of left lower lobe with node dissection.   CURRENT THERAPY: None.  INTERVAL HISTORY: [name] [name] 56 y.o. male returns to the clinic today for routine 4 month followup visit. The patient is feeling fine today with no specific complaints except for occasional soreness on the left side of his chest as well as shortness breath with exertion. He denied having any significant cough or hemoptysis. The patient gained almost 20 pounds since his surgery. He denied having any significant night sweats. The patient has repeat CT scan of the chest performed recently and he is here today for evaluation and discussion of his scan results.  MEDICAL HISTORY: Past Medical History  Diagnosis Date  . HTN (hypertension)   . Hypercholesteremia   . COPD (chronic obstructive pulmonary disease)   . Lung cancer     ALLERGIES:   has no known allergies.  MEDICATIONS:  Current Outpatient Prescriptions  Medication Sig Dispense Refill  . albuterol (PROVENTIL HFA;VENTOLIN HFA) 108 (90 BASE) MCG/ACT inhaler Inhale 2 puffs into the lungs every 6 (six) hours as needed.        Marland Kitchen guaiFENesin-dextromethorphan (ROBITUSSIN DM) 100-10 MG/5ML syrup Take 5 mLs by mouth 3 (three) times daily as needed.      . metoprolol tartrate (LOPRESSOR) 25 MG tablet Take 25 mg by mouth 2 (two) times daily. 1/2 TAB PO BID         SURGICAL HISTORY:  Past Surgical History    Procedure Date  . Left video-assisted thoracoscopy, mini thoracotomy; wedge, posterior segment of left upper lobe; wedge, anterior segment of left lower lobe with node dissection. 09/02/11    [facility]    REVIEW OF SYSTEMS:  A comprehensive review of systems was negative except for: Respiratory: positive for dyspnea on exertion   PHYSICAL EXAMINATION: General appearance: alert, cooperative and no distress Head: Normocephalic, without obvious abnormality, atraumatic Lymph nodes: Cervical, supraclavicular, and axillary nodes normal. Resp: clear to auscultation bilaterally Cardio: regular rate and rhythm, S1, S2 normal, no murmur, click, rub or gallop GI: soft, non-tender; bowel sounds normal; no masses,  no organomegaly Extremities: extremities normal, atraumatic, no cyanosis or edema Neurologic: Alert and oriented X 3, normal strength and tone. Normal symmetric reflexes. Normal coordination and gait  ECOG PERFORMANCE STATUS: 1 - Symptomatic but completely ambulatory  Blood pressure 164/97, pulse 75, temperature 97 F (36.1 C), temperature source Oral, resp. rate 18, height 6\' 1"  (1.854 m), weight 245 lb 1.6 oz (111.177 kg).  LABORATORY DATA: Lab Results  Component Value Date   WBC 6.9 07/24/2012   HGB 15.1 07/24/2012   HCT 44.3 07/24/2012   MCV 83.8 07/24/2012   PLT 205 07/24/2012      Chemistry      Component Value Date/Time   NA 138 07/24/2012 0919   NA 138 03/25/2012 1225   NA 133* 09/05/2011 0415   K 4.2 07/24/2012 0919   K 4.2 03/25/2012 1225   K 4.1 09/05/2011 0415   CL  107 07/24/2012 0919   CL 98 03/25/2012 1225   CL 98 09/05/2011 0415   CO2 21* 07/24/2012 0919   CO2 26 03/25/2012 1225   CO2 28 09/05/2011 0415   BUN 9.0 07/24/2012 0919   BUN 11 03/25/2012 1225   BUN 6 09/05/2011 0415   CREATININE 0.9 07/24/2012 0919   CREATININE 1.0 03/25/2012 1225   CREATININE 0.73 09/05/2011 0415      Component Value Date/Time   CALCIUM 9.0 07/24/2012 0919   CALCIUM 8.6 03/25/2012 1225    CALCIUM 9.2 09/05/2011 0415   ALKPHOS 83 07/24/2012 0919   ALKPHOS 84 03/25/2012 1225   ALKPHOS 65 09/04/2011 0345   AST 23 07/24/2012 0919   AST 33 03/25/2012 1225   AST 16 09/04/2011 0345   ALT 32 07/24/2012 0919   ALT 15 09/04/2011 0345   BILITOT 0.40 07/24/2012 0919   BILITOT 0.80 03/25/2012 1225   BILITOT 0.7 09/04/2011 0345       RADIOGRAPHIC STUDIES: Ct Chest W Contrast  07/24/2012  *RADIOLOGY REPORT*  Clinical Data: Lung cancer.  Shortness of breath, cough.  Partial left lobectomy.  Last chemotherapy September 2012.  CT CHEST WITH CONTRAST  Technique:  Multidetector CT imaging of the chest was performed following the standard protocol during bolus administration of intravenous contrast.  Contrast: 80mL OMNIPAQUE IOHEXOL 300 MG/ML  SOLN  Comparison: None.  Findings:  Mediastinum: Heart size is normal. There is no significant pericardial fluid, thickening or pericardial calcification. There is atherosclerosis of the thoracic aorta, the great vessels of the mediastinum and the coronary arteries, including calcified atherosclerotic plaque in the left main, left anterior descending (ostial and proximal) and left circumflex coronary arteries. No pathologically enlarged mediastinal or hilar lymph nodes. The esophagus is unremarkable in appearance.  Lungs/Pleura: Postoperative changes of wedge resection are noted in both the left upper and lower lobes, with some similar appearing postoperative scarring when compared to prior examinations.  There are numerous tiny pulmonary nodules scattered throughout the lungs bilaterally which are unchanged compared to preoperative CT scan dated 08/02/2011.  Specific examples include a 3 mm right lower lobe nodule (image 48 of series 5) and a 2 mm right upper lobe nodule (image 21 of series 5).  In addition, there are multifocal nodular ill-defined ground-glass attenuation foci scattered throughout the lungs bilaterally which are also similar to prior examinations  dating back to December 19, 2011, with specific examples including a right upper lobe lesion (image 14 of series 5), right lower lobe lesion (image 31 of series 5) and a left lower lobe lesion (image 47 of series 5).  No other new or enlarging suspicious appearing pulmonary nodules or masses are otherwise identified.  No consolidative airspace disease.  No pleural effusions.  Upper Abdomen: There are a few colonic diverticula noted in the transverse and descending colon.  Atherosclerosis.  Musculoskeletal: Post thoracotomy changes in the left ribs.  There are no aggressive appearing lytic or blastic lesions noted in the visualized portions of the skeleton.  IMPRESSION: 1.  Stable postoperative changes of wedge resection in the left upper and lower lobes, without evidence to suggest local recurrence of disease or metastatic disease in the thorax.  2.  There are multiple tiny pulmonary nodules and multiple ill- defined areas of ground-glass attenuation which are completely unchanged compared to prior examinations, as above.  This is favored to represent benign disease, but continued attention on follow-up studies is recommended. 3. Atherosclerosis, including left main and two-vessel coronary artery disease.  Please note that although the presence of coronary artery calcium documents the presence of coronary artery disease, the severity of this disease and any potential stenosis cannot be assessed on this non-gated CT examination.  Assessment for potential risk factor modification, dietary therapy or pharmacologic therapy may be warranted, if clinically indicated. 4.  Colonic diverticulosis.   Original Report Authenticated By: Florencia Reasons, M.D.     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 Edwyna Shell. The patient is doing fine and he has no evidence for disease recurrence.  PLAN: I discussed the scan results with the patient. I recommended for him to continue on observation for now. I would see him back for followup visit in 4 months with repeat CT scan of the chest.  The patient was advised to call me immediately if he has any concerning symptoms in the interval.  All questions were answered. The patient knows to call the clinic with any problems, questions or concerns. We can certainly see the patient much sooner if necessary.

## 2012-07-28 NOTE — Telephone Encounter (Signed)
appts made and printed for pt aom °

## 2012-11-23 ENCOUNTER — Ambulatory Visit (HOSPITAL_COMMUNITY)
Admission: RE | Admit: 2012-11-23 | Discharge: 2012-11-23 | Disposition: A | Discharge status: Home / Self Care | Payer: BC Managed Care – PPO | Source: Ambulatory Visit | Attending: Internal Medicine | Admitting: Internal Medicine

## 2012-11-23 ENCOUNTER — Encounter (HOSPITAL_COMMUNITY): Payer: Self-pay

## 2012-11-23 ENCOUNTER — Other Ambulatory Visit: Payer: Self-pay | Admitting: Internal Medicine

## 2012-11-23 ENCOUNTER — Other Ambulatory Visit (HOSPITAL_BASED_OUTPATIENT_CLINIC_OR_DEPARTMENT_OTHER): Payer: BC Managed Care – PPO | Admitting: Lab

## 2012-11-23 DIAGNOSIS — R05 Cough: Secondary | ICD-10-CM | POA: Insufficient documentation

## 2012-11-23 DIAGNOSIS — C3490 Malignant neoplasm of unspecified part of unspecified bronchus or lung: Secondary | ICD-10-CM

## 2012-11-23 DIAGNOSIS — R0602 Shortness of breath: Secondary | ICD-10-CM | POA: Insufficient documentation

## 2012-11-23 DIAGNOSIS — R059 Cough, unspecified: Secondary | ICD-10-CM | POA: Insufficient documentation

## 2012-11-23 DIAGNOSIS — R918 Other nonspecific abnormal finding of lung field: Secondary | ICD-10-CM | POA: Insufficient documentation

## 2012-11-23 LAB — COMPREHENSIVE METABOLIC PANEL (CC13)
BUN: 12 mg/dL (ref 7.0–26.0)
CO2: 25 mEq/L (ref 22–29)
Calcium: 9.4 mg/dL (ref 8.4–10.4)
Chloride: 106 mEq/L (ref 98–107)
Creatinine: 1 mg/dL (ref 0.7–1.3)
Glucose: 108 mg/dl — ABNORMAL HIGH (ref 70–99)
Total Bilirubin: 0.26 mg/dL (ref 0.20–1.20)

## 2012-11-23 LAB — CBC WITH DIFFERENTIAL/PLATELET
BASO%: 0.5 % (ref 0.0–2.0)
Eosinophils Absolute: 0.3 10*3/uL (ref 0.0–0.5)
HCT: 45.9 % (ref 38.4–49.9)
LYMPH%: 32.3 % (ref 14.0–49.0)
MONO#: 0.8 10*3/uL (ref 0.1–0.9)
NEUT#: 4.4 10*3/uL (ref 1.5–6.5)
NEUT%: 54.1 % (ref 39.0–75.0)
Platelets: 225 10*3/uL (ref 140–400)
WBC: 8.2 10*3/uL (ref 4.0–10.3)
lymph#: 2.6 10*3/uL (ref 0.9–3.3)

## 2012-11-23 MED ORDER — IOHEXOL 300 MG/ML  SOLN
100.0000 mL | Freq: Once | INTRAMUSCULAR | Status: AC | PRN
  Administered 2012-11-23: 100 mL via INTRAVENOUS

## 2012-11-25 ENCOUNTER — Encounter: Payer: Self-pay | Admitting: Internal Medicine

## 2012-11-25 ENCOUNTER — Telehealth: Payer: Self-pay | Admitting: Internal Medicine

## 2012-11-25 ENCOUNTER — Ambulatory Visit (HOSPITAL_BASED_OUTPATIENT_CLINIC_OR_DEPARTMENT_OTHER): Payer: BC Managed Care – PPO | Admitting: Internal Medicine

## 2012-11-25 VITALS — BP 155/105 | HR 92 | Temp 96.7°F | Resp 20 | Ht 73.0 in | Wt 249.3 lb

## 2012-11-25 DIAGNOSIS — C3490 Malignant neoplasm of unspecified part of unspecified bronchus or lung: Secondary | ICD-10-CM

## 2012-11-25 DIAGNOSIS — R0602 Shortness of breath: Secondary | ICD-10-CM

## 2012-11-25 DIAGNOSIS — C3410 Malignant neoplasm of upper lobe, unspecified bronchus or lung: Secondary | ICD-10-CM

## 2012-11-25 DIAGNOSIS — R911 Solitary pulmonary nodule: Secondary | ICD-10-CM

## 2012-11-25 NOTE — Progress Notes (Signed)
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   PRIOR THERAPY:  1) Status post 3 cycles of systemic chemotherapy with carboplatin and etoposide. Last dose was given on 07/16/2011.  2) Left video-assisted thoracoscopy, mini thoracotomy; wedge, posterior segment of left upper lobe; wedge, anterior segment of left lower lobe with node dissection.   CURRENT THERAPY: None.  INTERVAL HISTORY: [name] [name] 57 y.o. male returns to the clinic today for followup visit. The patient is feeling fine today with no specific complaints except for shortness breath with exertion. He denied having any significant chest pain, cough or hemoptysis. The patient denied having any significant weight loss or night sweats. He had repeat CT scan of the chest performed recently and he is here today for evaluation and discussion of his scan results.  MEDICAL HISTORY: Past Medical History  Diagnosis Date  . HTN (hypertension)   . Hypercholesteremia   . COPD (chronic obstructive pulmonary disease)   . Lung cancer     ALLERGIES:   has no known allergies.  MEDICATIONS:  Current Outpatient Prescriptions  Medication Sig Dispense Refill  . VENTOLIN HFA 108 (90 BASE) MCG/ACT inhaler INHALE 1 TO 2 PUFFS BY MOUTH EVERY 4 HOURS AS NEEDED  18 each  0    SURGICAL HISTORY:  Past Surgical History  Procedure Date  . Left video-assisted thoracoscopy, mini thoracotomy; wedge, posterior segment of left upper lobe; wedge, anterior segment of left lower lobe with node dissection. 09/02/11    [facility]    REVIEW OF SYSTEMS:  A comprehensive review of systems was negative except for: Respiratory: positive for dyspnea on exertion   PHYSICAL EXAMINATION: General appearance: alert, cooperative and no distress Head: Normocephalic, without obvious  abnormality, atraumatic Neck: no adenopathy Resp: clear to auscultation bilaterally Cardio: regular rate and rhythm, S1, S2 normal, no murmur, click, rub or gallop GI: soft, non-tender; bowel sounds normal; no masses,  no organomegaly Extremities: extremities normal, atraumatic, no cyanosis or edema Neurologic: Alert and oriented X 3, normal strength and tone. Normal symmetric reflexes. Normal coordination and gait  ECOG PERFORMANCE STATUS: 1 - Symptomatic but completely ambulatory  Blood pressure 173/108, pulse 92, temperature 96.7 F (35.9 C), temperature source Oral, resp. rate 20, height 6\' 1"  (1.854 m), weight 249 lb 4.8 oz (113.082 kg).  LABORATORY DATA: Lab Results  Component Value Date   WBC 8.2 11/23/2012   HGB 15.7 11/23/2012   HCT 45.9 11/23/2012   MCV 82.0 11/23/2012   PLT 225 11/23/2012      Chemistry      Component Value Date/Time   NA 138 11/23/2012 0829   NA 138 03/25/2012 1225   NA 133* 09/05/2011 0415   K 4.9 11/23/2012 0829   K 4.2 03/25/2012 1225   K 4.1 09/05/2011 0415   CL 106 11/23/2012 0829   CL 98 03/25/2012 1225   CL 98 09/05/2011 0415   CO2 25 11/23/2012 0829   CO2 26 03/25/2012 1225   CO2 28 09/05/2011 0415   BUN 12.0 11/23/2012 0829   BUN 11 03/25/2012 1225   BUN 6 09/05/2011 0415   CREATININE 1.0 11/23/2012 0829   CREATININE 1.0 03/25/2012 1225   CREATININE 0.73 09/05/2011 0415      Component Value Date/Time   CALCIUM 9.4 11/23/2012 0829   CALCIUM 8.6 03/25/2012 1225  CALCIUM 9.2 09/05/2011 0415   ALKPHOS 86 11/23/2012 0829   ALKPHOS 84 03/25/2012 1225   ALKPHOS 65 09/04/2011 0345   AST 20 11/23/2012 0829   AST 33 03/25/2012 1225   AST 16 09/04/2011 0345   ALT 29 11/23/2012 0829   ALT 15 09/04/2011 0345   BILITOT 0.26 11/23/2012 0829   BILITOT 0.80 03/25/2012 1225   BILITOT 0.7 09/04/2011 0345       RADIOGRAPHIC STUDIES: Ct Chest W Contrast  11/23/2012  *RADIOLOGY REPORT*  Clinical Data: Lung cancer.  Shortness of breath and cough.  CT CHEST WITH  CONTRAST  Technique:  Multidetector CT imaging of the chest was performed following the standard protocol during bolus administration of intravenous contrast.  Contrast: OMNIPAQUE IOHEXOL 300 MG/ML  SOLN  Comparison: 07/24/2012  Findings: No axillary lymphadenopathy.  No mediastinal or hilar lymphadenopathy.  Heart size is normal.  No pericardial or pleural effusion.  Suture line is noted in the left upper and lower lobes.  The soft tissue attenuation associated with the areas of wedge resection is stable.  Multiple tiny bilateral pulmonary nodules are identified.  Most of these are unchanged in the interval.  Several small nodular areas of ground-glass attenuation are also identified and remain stable.  Although no new pulmonary nodules are evident, there is an enlarging nodule identified in the left lower lobe.  8 mm nodule (see image 39 of series 5) has progressed in size from 4 mm on the previous study (also seen on image 39 of series 5 on that exam).  Bone windows reveal no worrisome lytic or sclerotic osseous lesions.  IMPRESSION: Stable postsurgical change in the left upper and lower lobes.  The patient has multiple bilateral tiny soft tissue attenuating and ground-glass nodules bilaterally.  Although no new nodules are evident on today's exam, there is an enlarging nodule in the left lower lobe which is 8 mm today compared 4 mm previously.  Continued close attention is recommended.   Original Report Authenticated By: Kennith Center, M.D.     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 The patient has been observation with no evidence for disease progression but the  recent scan showed enlarging left lower lobe nodule but currently measuring 8 mm in size, suspicious for disease recurrence.  PLAN: I discussed the scan results and showed the images to the patient today. I recommended for him to continue on observation for now but we'll repeat CT scan of the chest in 3 months for further evaluation of this nodule. If this nodule continues to increase in size, I would consider the patient for a PET scan as well as repeat biopsy. He was advised to call immediately if he has any concerning symptoms in the interval.  All questions were answered. The patient knows to call the clinic with any problems, questions or concerns. We can certainly see the patient much sooner if necessary.

## 2012-11-25 NOTE — Patient Instructions (Signed)
Your CT scan showed enlarging nodule in the left lower lobe. We will repeat CT scan of the chest in 3 months for further evaluation of this nodule.

## 2012-11-25 NOTE — Telephone Encounter (Signed)
gv pt appt schedule for April 2014. Pt aware central will call re ct for April.

## 2013-02-22 ENCOUNTER — Encounter (HOSPITAL_COMMUNITY): Payer: Self-pay

## 2013-02-22 ENCOUNTER — Ambulatory Visit (HOSPITAL_COMMUNITY)
Admission: RE | Admit: 2013-02-22 | Discharge: 2013-02-22 | Disposition: A | Discharge status: Home / Self Care | Payer: BC Managed Care – PPO | Source: Ambulatory Visit | Attending: Internal Medicine | Admitting: Internal Medicine

## 2013-02-22 ENCOUNTER — Other Ambulatory Visit (HOSPITAL_BASED_OUTPATIENT_CLINIC_OR_DEPARTMENT_OTHER): Payer: BC Managed Care – PPO | Admitting: Lab

## 2013-02-22 DIAGNOSIS — Z902 Acquired absence of lung [part of]: Secondary | ICD-10-CM | POA: Insufficient documentation

## 2013-02-22 DIAGNOSIS — C3410 Malignant neoplasm of upper lobe, unspecified bronchus or lung: Secondary | ICD-10-CM

## 2013-02-22 DIAGNOSIS — J984 Other disorders of lung: Secondary | ICD-10-CM | POA: Insufficient documentation

## 2013-02-22 DIAGNOSIS — C3490 Malignant neoplasm of unspecified part of unspecified bronchus or lung: Secondary | ICD-10-CM | POA: Insufficient documentation

## 2013-02-22 DIAGNOSIS — R911 Solitary pulmonary nodule: Secondary | ICD-10-CM | POA: Insufficient documentation

## 2013-02-22 DIAGNOSIS — R079 Chest pain, unspecified: Secondary | ICD-10-CM | POA: Insufficient documentation

## 2013-02-22 DIAGNOSIS — Z9221 Personal history of antineoplastic chemotherapy: Secondary | ICD-10-CM | POA: Insufficient documentation

## 2013-02-22 DIAGNOSIS — R0602 Shortness of breath: Secondary | ICD-10-CM | POA: Insufficient documentation

## 2013-02-22 LAB — COMPREHENSIVE METABOLIC PANEL (CC13)
BUN: 10.7 mg/dL (ref 7.0–26.0)
CO2: 24 mEq/L (ref 22–29)
Calcium: 8.8 mg/dL (ref 8.4–10.4)
Chloride: 105 mEq/L (ref 98–107)
Creatinine: 1 mg/dL (ref 0.7–1.3)
Glucose: 109 mg/dl — ABNORMAL HIGH (ref 70–99)
Total Bilirubin: 0.51 mg/dL (ref 0.20–1.20)

## 2013-02-22 LAB — CBC WITH DIFFERENTIAL/PLATELET
BASO%: 0.5 % (ref 0.0–2.0)
Basophils Absolute: 0 10*3/uL (ref 0.0–0.1)
HCT: 44.8 % (ref 38.4–49.9)
HGB: 15.1 g/dL (ref 13.0–17.1)
LYMPH%: 33.1 % (ref 14.0–49.0)
MCHC: 33.6 g/dL (ref 32.0–36.0)
MONO#: 0.8 10*3/uL (ref 0.1–0.9)
NEUT%: 52.1 % (ref 39.0–75.0)
Platelets: 227 10*3/uL (ref 140–400)
WBC: 7.8 10*3/uL (ref 4.0–10.3)
lymph#: 2.6 10*3/uL (ref 0.9–3.3)

## 2013-02-22 MED ORDER — IOHEXOL 300 MG/ML  SOLN
80.0000 mL | Freq: Once | INTRAMUSCULAR | Status: AC | PRN
  Administered 2013-02-22: 80 mL via INTRAVENOUS

## 2013-02-25 ENCOUNTER — Telehealth: Payer: Self-pay | Admitting: Internal Medicine

## 2013-02-25 ENCOUNTER — Encounter: Payer: Self-pay | Admitting: Internal Medicine

## 2013-02-25 ENCOUNTER — Ambulatory Visit (HOSPITAL_BASED_OUTPATIENT_CLINIC_OR_DEPARTMENT_OTHER): Payer: BC Managed Care – PPO | Admitting: Internal Medicine

## 2013-02-25 DIAGNOSIS — Z85118 Personal history of other malignant neoplasm of bronchus and lung: Secondary | ICD-10-CM

## 2013-02-25 MED ORDER — ALPRAZOLAM 0.25 MG PO TABS: 0.2500 mg | ORAL_TABLET | Freq: Every evening | ORAL | Status: DC | PRN

## 2013-02-25 MED ORDER — ALBUTEROL SULFATE HFA 108 (90 BASE) MCG/ACT IN AERS: INHALATION_SPRAY | RESPIRATORY_TRACT | Status: DC

## 2013-02-25 NOTE — Patient Instructions (Signed)
The left lower lobe pulmonary nodule continues to increase in size. I will order a PET scan for further evaluation of this nodule. Followup in 2 weeks

## 2013-02-25 NOTE — Progress Notes (Signed)
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   PRIOR THERAPY:  1) Status post 3 cycles of systemic chemotherapy with carboplatin and etoposide. Last dose was given on 07/16/2011.  2) Left video-assisted thoracoscopy, mini thoracotomy; wedge, posterior segment of left upper lobe; wedge, anterior segment of left lower lobe with node dissection.   CURRENT THERAPY: None.  INTERVAL HISTORY: [name] [name] 57 y.o. male returns to the clinic today for followup visit accompanied by his wife. The patient has no complaints today except for shortness breath with exertion. He denied having any significant chest pain, cough or hemoptysis. The patient denied having any significant weight loss or night sweats. He still complaining of some anxiety and requested medication for it. Unfortunately he continues to smoke and I strongly encouraged him to quit smoking today. He had repeat CT scan of the chest performed recently and he is here for evaluation and discussion of his scan results.  MEDICAL HISTORY: Past Medical History  Diagnosis Date  . HTN (hypertension)   . Hypercholesteremia   . COPD (chronic obstructive pulmonary disease)   . Lung cancer     ALLERGIES:  has No Known Allergies.  MEDICATIONS:  Current Outpatient Prescriptions  Medication Sig Dispense Refill  . VENTOLIN HFA 108 (90 BASE) MCG/ACT inhaler INHALE 1 TO 2 PUFFS BY MOUTH EVERY 4 HOURS AS NEEDED  18 each  0   No current facility-administered medications for this visit.    SURGICAL HISTORY:  Past Surgical History  Procedure Laterality Date  . Left video-assisted thoracoscopy, mini thoracotomy; wedge, posterior segment of left upper lobe; wedge, anterior segment of left lower lobe with node dissection.  09/02/11    [facility]    REVIEW OF SYSTEMS:   A comprehensive review of systems was negative except for: Respiratory: positive for dyspnea on exertion   PHYSICAL EXAMINATION: General appearance: alert, cooperative and no distress Head: Normocephalic, without obvious abnormality, atraumatic Neck: no adenopathy Lymph nodes: Cervical, supraclavicular, and axillary nodes normal. Resp: clear to auscultation bilaterally Cardio: regular rate and rhythm, S1, S2 normal, no murmur, click, rub or gallop GI: soft, non-tender; bowel sounds normal; no masses,  no organomegaly Extremities: extremities normal, atraumatic, no cyanosis or edema Neurologic: Alert and oriented X 3, normal strength and tone. Normal symmetric reflexes. Normal coordination and gait  ECOG PERFORMANCE STATUS: 1 - Symptomatic but completely ambulatory  Blood pressure 159/106, pulse 98, temperature 97 F (36.1 C), temperature source Oral, resp. rate 18, height 6\' 1"  (1.854 m), weight 257 lb 4.8 oz (116.711 kg).  LABORATORY DATA: Lab Results  Component Value Date   WBC 7.8 02/22/2013   HGB 15.1 02/22/2013   HCT 44.8 02/22/2013   MCV 82.5 02/22/2013   PLT 227 02/22/2013      Chemistry      Component Value Date/Time   NA 139 02/22/2013 0828   NA 138 03/25/2012 1225   NA 133* 09/05/2011 0415   K 4.3 02/22/2013 0828   K 4.2 03/25/2012 1225   K 4.1 09/05/2011 0415   CL 105 02/22/2013 0828   CL 98 03/25/2012 1225   CL 98 09/05/2011 0415   CO2 24 02/22/2013 0828   CO2 26 03/25/2012 1225   CO2 28 09/05/2011 0415   BUN 10.7 02/22/2013 0828   BUN 11 03/25/2012 1225  BUN 6 09/05/2011 0415   CREATININE 1.0 02/22/2013 0828   CREATININE 1.0 03/25/2012 1225   CREATININE 0.73 09/05/2011 0415      Component Value Date/Time   CALCIUM 8.8 02/22/2013 0828   CALCIUM 8.6 03/25/2012 1225   CALCIUM 9.2 09/05/2011 0415   ALKPHOS 82 02/22/2013 0828   ALKPHOS 84 03/25/2012 1225   ALKPHOS 65 09/04/2011 0345   AST 25 02/22/2013 0828   AST 33 03/25/2012 1225   AST 16 09/04/2011 0345   ALT 35  02/22/2013 0828   ALT 15 09/04/2011 0345   BILITOT 0.51 02/22/2013 0828   BILITOT 0.80 03/25/2012 1225   BILITOT 0.7 09/04/2011 0345       RADIOGRAPHIC STUDIES: Ct Chest W Contrast  02/22/2013  *RADIOLOGY REPORT*  Clinical Data: Status post resection of left lung cancer.  CT CHEST WITH CONTRAST  Technique:  Multidetector CT imaging of the chest was performed following the standard protocol during bolus administration of intravenous contrast.  Contrast: 80mL OMNIPAQUE IOHEXOL 300 MG/ML  SOLN  Comparison: 11/23/2012  Findings: There is no axillary lymphadenopathy.  No mediastinal or hilar lymphadenopathy.  Heart size is normal.  No pericardial or pleural effusion.  Small focus of ground-glass attenuation in the central right upper lobe is unchanged.  Tiny right lower lobe nodular density on image 50 and is also stable.  8 mm left lower lobe pulmonary nodule has increased in size to 10 mm in the interval.  Architectural distortion with suture material in the left lung is consistent with the previous surgery.  Images which include the upper abdomen show fatty changes in the liver.  No evidence for adrenal mass. Bone windows reveal no worrisome lytic or sclerotic osseous lesions.  IMPRESSION: Postsurgical change in the left lung with continued progression of a left lower lobe pulmonary nodule, now measuring 10 mm in diameter.  Given the continued progression over multiple exams ( 4 mm on 07/24/2012 to 8 mm on 11/23/2012 and now to 10 mm on 02/22/2013), disease recurrence is a distinct concern.  PET CT may prove helpful to further evaluate.   Original Report Authenticated By: Kennith Center, M.D.     ASSESSMENT: this is a very pleasant 57 years old white male with history of stage IB non-small cell lung cancer mixed with small cell carcinoma status post 3 cycles of neoadjuvant chemotherapy with carboplatin and etoposide followed by wedge resection of the posterior segment of the left upper lobe and anterior  segment of the left lower lobe with node dissection and has been on observation since that time was no evidence for disease recurrence except for slowly growing left lower lobe pulmonary nodule.   PLAN:  I have a lengthy discussion with the patient and his wife today abouthis scan results and showed them the images. I recommended for the patient and to have a PET scan for further evaluation of this lesion. I would see him back for follow up visit in 2 weeks for discussion of the scan results and recommendation regarding this new pulmonary nodule. I strongly encouraged the patient to quit smoking and offered him smoke cessation program. For anxiety, I gave the patient prescription for Xanax 0.25 mg by mouth at bedtime as needed. The patient was also given a refill of his Ventolin inhaler and he promised to establish care with a primary care physician as he understands that I would not be able to continue to give him refill of his medications. I gave the patient and his wife  the time to ask questions and I answered them completely to their satisfactions.   All questions were answered. The patient knows to call the clinic with any problems, questions or concerns. We can certainly see the patient much sooner if necessary.  I spent 20 minutes counseling the patient face to face. The total time spent in the appointment was 30 minutes.

## 2013-03-02 ENCOUNTER — Other Ambulatory Visit: Payer: Self-pay | Admitting: *Deleted

## 2013-03-02 ENCOUNTER — Telehealth: Payer: Self-pay | Admitting: Internal Medicine

## 2013-03-02 NOTE — Telephone Encounter (Signed)
pt called to r/s appt he wants wife to come with him to appt.

## 2013-03-03 ENCOUNTER — Other Ambulatory Visit (HOSPITAL_BASED_OUTPATIENT_CLINIC_OR_DEPARTMENT_OTHER): Payer: BC Managed Care – PPO | Admitting: Lab

## 2013-03-03 ENCOUNTER — Other Ambulatory Visit: Payer: BC Managed Care – PPO | Admitting: Lab

## 2013-03-03 ENCOUNTER — Ambulatory Visit (HOSPITAL_COMMUNITY)
Admission: RE | Admit: 2013-03-03 | Discharge: 2013-03-03 | Disposition: A | Discharge status: Home / Self Care | Payer: BC Managed Care – PPO | Source: Ambulatory Visit | Attending: Internal Medicine | Admitting: Internal Medicine

## 2013-03-03 DIAGNOSIS — J3489 Other specified disorders of nose and nasal sinuses: Secondary | ICD-10-CM | POA: Insufficient documentation

## 2013-03-03 DIAGNOSIS — R911 Solitary pulmonary nodule: Secondary | ICD-10-CM | POA: Insufficient documentation

## 2013-03-03 DIAGNOSIS — C3490 Malignant neoplasm of unspecified part of unspecified bronchus or lung: Secondary | ICD-10-CM

## 2013-03-03 DIAGNOSIS — E7800 Pure hypercholesterolemia, unspecified: Secondary | ICD-10-CM | POA: Insufficient documentation

## 2013-03-03 DIAGNOSIS — K5730 Diverticulosis of large intestine without perforation or abscess without bleeding: Secondary | ICD-10-CM | POA: Insufficient documentation

## 2013-03-03 DIAGNOSIS — I2510 Atherosclerotic heart disease of native coronary artery without angina pectoris: Secondary | ICD-10-CM | POA: Insufficient documentation

## 2013-03-03 DIAGNOSIS — I10 Essential (primary) hypertension: Secondary | ICD-10-CM | POA: Insufficient documentation

## 2013-03-03 DIAGNOSIS — Z79899 Other long term (current) drug therapy: Secondary | ICD-10-CM | POA: Insufficient documentation

## 2013-03-03 LAB — CBC WITH DIFFERENTIAL/PLATELET
BASO%: 0.3 % (ref 0.0–2.0)
HCT: 45.2 % (ref 38.4–49.9)
MCHC: 33.4 g/dL (ref 32.0–36.0)
MONO#: 0.8 10*3/uL (ref 0.1–0.9)
NEUT#: 4.6 10*3/uL (ref 1.5–6.5)
NEUT%: 54.2 % (ref 39.0–75.0)
RBC: 5.43 10*6/uL (ref 4.20–5.82)
WBC: 8.5 10*3/uL (ref 4.0–10.3)
lymph#: 2.7 10*3/uL (ref 0.9–3.3)

## 2013-03-03 LAB — COMPREHENSIVE METABOLIC PANEL (CC13)
ALT: 34 U/L (ref 0–55)
Albumin: 3.5 g/dL (ref 3.5–5.0)
CO2: 25 mEq/L (ref 22–29)
Calcium: 9.3 mg/dL (ref 8.4–10.4)
Chloride: 105 mEq/L (ref 98–107)
Sodium: 139 mEq/L (ref 136–145)
Total Protein: 7.3 g/dL (ref 6.4–8.3)

## 2013-03-03 LAB — GLUCOSE, CAPILLARY: Glucose-Capillary: 112 mg/dL — ABNORMAL HIGH (ref 70–99)

## 2013-03-15 ENCOUNTER — Ambulatory Visit: Payer: BC Managed Care – PPO | Admitting: Internal Medicine

## 2013-03-22 ENCOUNTER — Encounter: Payer: Self-pay | Admitting: *Deleted

## 2013-03-22 ENCOUNTER — Encounter: Payer: Self-pay | Admitting: Internal Medicine

## 2013-03-22 ENCOUNTER — Telehealth: Payer: Self-pay | Admitting: *Deleted

## 2013-03-22 ENCOUNTER — Ambulatory Visit (HOSPITAL_BASED_OUTPATIENT_CLINIC_OR_DEPARTMENT_OTHER): Payer: BC Managed Care – PPO | Admitting: Internal Medicine

## 2013-03-22 DIAGNOSIS — C3430 Malignant neoplasm of lower lobe, unspecified bronchus or lung: Secondary | ICD-10-CM

## 2013-03-22 DIAGNOSIS — R0602 Shortness of breath: Secondary | ICD-10-CM

## 2013-03-22 DIAGNOSIS — R599 Enlarged lymph nodes, unspecified: Secondary | ICD-10-CM

## 2013-03-22 NOTE — Patient Instructions (Signed)
The PET scan showed questionable evidence for disease recurrence. I will refer him to cardiothoracic surgery for evaluation and tissue diagnosis.

## 2013-03-22 NOTE — Progress Notes (Signed)
Spoke with pt at [facility] today.  Gave and explained smoking cessation information

## 2013-03-22 NOTE — Telephone Encounter (Signed)
Left VM regarding appt with Dr. Dorris Fetch 03/30/13 at 11:30

## 2013-03-22 NOTE — Progress Notes (Signed)
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   PRIOR THERAPY:  1) Status post 3 cycles of systemic chemotherapy with carboplatin and etoposide. Last dose was given on 07/16/2011.  2) Left video-assisted thoracoscopy, mini thoracotomy; wedge, posterior segment of left upper lobe; wedge, anterior segment of left lower lobe with node dissection.   CURRENT THERAPY: None.  INTERVAL HISTORY: [name] [name] 57 y.o. male returns to the clinic today for followup visit accompanied by his wife. The patient is feeling fine today with no specific complaints except for shortness breath with exertion. He denied having any significant chest pain, cough or hemoptysis. He denied having any significant weight loss or night sweats. Unfortunately he continues to smoke and I again I strongly encouraged him to quit smoking and the thoracic Navigator will provide him today with smoke cessation information. He had repeat PET scan performed recently and he is here for evaluation and discussion of his scan results.  MEDICAL HISTORY: Past Medical History  Diagnosis Date  . HTN (hypertension)   . Hypercholesteremia   . COPD (chronic obstructive pulmonary disease)   . Lung cancer     ALLERGIES:  has No Known Allergies.  MEDICATIONS:  Current Outpatient Prescriptions  Medication Sig Dispense Refill  . albuterol (VENTOLIN HFA) 108 (90 BASE) MCG/ACT inhaler INHALE 1 TO 2 PUFFS BY MOUTH EVERY 4 HOURS AS NEEDED  18 each  0  . ALPRAZolam (XANAX) 0.25 MG tablet Take 1 tablet (0.25 mg total) by mouth at bedtime as needed for sleep.  30 tablet  0   No current facility-administered medications for this visit.    SURGICAL HISTORY:  Past Surgical History  Procedure Laterality Date  . Left video-assisted thoracoscopy, mini thoracotomy;  wedge, posterior segment of left upper lobe; wedge, anterior segment of left lower lobe with node dissection.  09/02/11    [facility]    REVIEW OF SYSTEMS:  A comprehensive review of systems was negative except for: Respiratory: positive for dyspnea on exertion   PHYSICAL EXAMINATION: General appearance: alert, cooperative and no distress Head: Normocephalic, without obvious abnormality, atraumatic Neck: no adenopathy Lymph nodes: Cervical, supraclavicular, and axillary nodes normal. Resp: clear to auscultation bilaterally Cardio: regular rate and rhythm, S1, S2 normal, no murmur, click, rub or gallop GI: soft, non-tender; bowel sounds normal; no masses,  no organomegaly Extremities: extremities normal, atraumatic, no cyanosis or edema Neurologic: Alert and oriented X 3, normal strength and tone. Normal symmetric reflexes. Normal coordination and gait  ECOG PERFORMANCE STATUS: 1 - Symptomatic but completely ambulatory  There were no vitals taken for this visit.  LABORATORY DATA: Lab Results  Component Value Date   WBC 8.5 03/03/2013   HGB 15.1 03/03/2013   HCT 45.2 03/03/2013   MCV 83.2 03/03/2013   PLT 230 03/03/2013      Chemistry      Component Value Date/Time   NA 139 03/03/2013 1002   NA 138 03/25/2012 1225   NA 133* 09/05/2011 0415   K 4.7 03/03/2013 1002   K 4.2 03/25/2012 1225   K 4.1 09/05/2011 0415   CL 105 03/03/2013 1002   CL 98 03/25/2012 1225   CL 98 09/05/2011 0415   CO2 25 03/03/2013 1002   CO2 26 03/25/2012 1225   CO2 28 09/05/2011 0415   BUN 12.0 03/03/2013 1002  BUN 11 03/25/2012 1225   BUN 6 09/05/2011 0415   CREATININE 1.0 03/03/2013 1002   CREATININE 1.0 03/25/2012 1225   CREATININE 0.73 09/05/2011 0415      Component Value Date/Time   CALCIUM 9.3 03/03/2013 1002   CALCIUM 8.6 03/25/2012 1225   CALCIUM 9.2 09/05/2011 0415   ALKPHOS 85 03/03/2013 1002   ALKPHOS 84 03/25/2012 1225   ALKPHOS 65 09/04/2011 0345   AST 23 03/03/2013 1002   AST 33 03/25/2012 1225    AST 16 09/04/2011 0345   ALT 34 03/03/2013 1002   ALT 15 09/04/2011 0345   BILITOT 0.36 03/03/2013 1002   BILITOT 0.80 03/25/2012 1225   BILITOT 0.7 09/04/2011 0345       RADIOGRAPHIC STUDIES: Ct Chest W Contrast  02/22/2013  *RADIOLOGY REPORT*  Clinical Data: Status post resection of left lung cancer.  CT CHEST WITH CONTRAST  Technique:  Multidetector CT imaging of the chest was performed following the standard protocol during bolus administration of intravenous contrast.  Contrast: 80mL OMNIPAQUE IOHEXOL 300 MG/ML  SOLN  Comparison: 11/23/2012  Findings: There is no axillary lymphadenopathy.  No mediastinal or hilar lymphadenopathy.  Heart size is normal.  No pericardial or pleural effusion.  Small focus of ground-glass attenuation in the central right upper lobe is unchanged.  Tiny right lower lobe nodular density on image 50 and is also stable.  8 mm left lower lobe pulmonary nodule has increased in size to 10 mm in the interval.  Architectural distortion with suture material in the left lung is consistent with the previous surgery.  Images which include the upper abdomen show fatty changes in the liver.  No evidence for adrenal mass. Bone windows reveal no worrisome lytic or sclerotic osseous lesions.  IMPRESSION: Postsurgical change in the left lung with continued progression of a left lower lobe pulmonary nodule, now measuring 10 mm in diameter.  Given the continued progression over multiple exams ( 4 mm on 07/24/2012 to 8 mm on 11/23/2012 and now to 10 mm on 02/22/2013), disease recurrence is a distinct concern.  PET CT may prove helpful to further evaluate.   Original Report Authenticated By: Kennith Center, M.D.    Nm Pet Image Restag (ps) Skull Base To Thigh  03/03/2013  *RADIOLOGY REPORT*  Clinical Data: Subsequent treatment strategy for lung cancer. Restaging scan.  NUCLEAR MEDICINE PET SKULL BASE TO THIGH  Fasting Blood Glucose:  112  Technique:  19.8 mCi F-18 FDG was injected intravenously.  CT data was obtained and used for attenuation correction and anatomic localization only.  (This was not acquired as a diagnostic CT examination.) Additional exam technical data entered on technologist worksheet.  Comparison:  Chest CT 02/22/2013.  PET CT 08/22/2011.  Findings:  Neck: No hypermetabolic lymph nodes in the neck. Small amount of mucosal thickening in the left maxillary sinus.  Small air fluid level layering dependently in the right maxillary sinus.  Chest:  Postoperative changes of prior left lower lobe wedge resection and noted.  In close proximity to the inferior aspect of the resection margin there is a 11 mm hypermetabolic nodule (SUVmax = 5.9).  There are multiple small foci of hypermetabolism in and around the left hilar region which are difficult to discretely visualize on the noncontrast CT images, although one of these adjacent to the left inferior pulmonary vein is only 9 mm in short axis (image 104 of series 2).  These areas are all mildly hypermetabolic (SUVmax = 4.9 - 6.6), and are  concerning for potential small malignant lymph nodes. There is also a 4 mm nodule in the right lower lobe (image 115 of series 2) which is similar to prior studies dating back to 2012, and presumably benign.  There are a few small areas of ground-glass attenuation in the superior segment of the right lower lobe, as well as a right upper lobe, which are highly nonspecific, but similar to prior examinations. There is atherosclerosis of the thoracic aorta, the great vessels of the mediastinum and the coronary arteries, including calcified atherosclerotic plaque in the left main, left anterior descending, left circumflex and right coronary arteries.  Abdomen/Pelvis:  No abnormal hypermetabolic activity within the liver, pancreas, adrenal glands, or spleen.  No hypermetabolic lymph nodes in the abdomen or pelvis.  Atherosclerotic calcifications throughout the abdominal and pelvic vasculature, without definite  aneurysm.  There are a few scattered colonic diverticula, without surrounding inflammatory changes to suggest acute diverticulitis at this time.  Normal appendix.  Prostate and urinary bladder are unremarkable in appearance.  Skeleton:  No focal hypermetabolic activity to suggest skeletal metastasis. Post thoracotomy changes in the left ninth rib posterolaterally.  IMPRESSION: 1.  11 mm hypermetabolic left lower lobe nodule with multiple small left hilar lymph nodes which demonstrate mild hypermetabolism. Findings are concerning for local recurrence of disease with left hilar nodal spread.  Clinical correlation is recommended. 2.  Paranasal sinus disease, as above, including a small air fluid level the right maxillary sinus.  Clinical correlation for signs and symptoms of acute sinusitis is recommended. 3. Atherosclerosis, including left main and three-vessel coronary artery disease.   Assessment for potential risk factor modification, dietary therapy or pharmacologic therapy may be warranted, if clinically indicated. 4.  Colonic diverticulosis without findings to suggest acute diverticulitis at this time.   Original Report Authenticated By: Trudie Reed, M.D.     ASSESSMENT: This is a very pleasant 57 years old white male with questionable recurrent lung cancer. This was initially diagnosed as poorly differentiated carcinoma with features of small cell carcinoma in July of 2012 is status post systemic chemotherapy with carboplatin and etoposide followed by wedge resection of the posterior segment of the left upper lobe and anterior segment of the left lower lobe with node dissection.    PLAN: I have a lengthy discussion with the patient and his wife today about his condition. I showed them the images of the PET scan. He has findings concerning for recurrent disease in the left lower lobe as well as hilar lymphadenopathy. I would refer the patient to cardiothoracic surgery for consideration of tissue  diagnosis and also surgical evaluation for resection. If the patient is not a surgical candidate, he may be considered for a course of concurrent chemoradiation and the chemotherapy will be dependent on the final tissue diagnosis. I did not give him a followup appointment at this point, awaiting the surgical evaluation. The patient knows to call me immediately if he has any concerning symptoms in the interval.  All questions were answered. The patient knows to call the clinic with any problems, questions or concerns. We can certainly see the patient much sooner if necessary.  I spent 15 minutes counseling the patient face to face. The total time spent in the appointment was 25 minutes.

## 2013-03-29 ENCOUNTER — Telehealth: Payer: Self-pay | Admitting: Internal Medicine

## 2013-03-29 ENCOUNTER — Telehealth: Payer: Self-pay | Admitting: *Deleted

## 2013-03-29 NOTE — Telephone Encounter (Signed)
Received medical records release fax from [facility].  Pt is requesting medical records to be faxed to [facility].  Form given to medical records to review.  SLJ

## 2013-03-29 NOTE — Telephone Encounter (Signed)
Faxed pt medical records to [facility]

## 2013-03-30 ENCOUNTER — Ambulatory Visit (INDEPENDENT_AMBULATORY_CARE_PROVIDER_SITE_OTHER): Payer: BC Managed Care – PPO | Admitting: Thoracic Surgery (Cardiothoracic Vascular Surgery)

## 2013-03-30 ENCOUNTER — Telehealth: Payer: Self-pay | Admitting: *Deleted

## 2013-03-30 ENCOUNTER — Other Ambulatory Visit: Payer: Self-pay | Admitting: *Deleted

## 2013-03-30 ENCOUNTER — Encounter: Payer: Self-pay | Admitting: Thoracic Surgery (Cardiothoracic Vascular Surgery)

## 2013-03-30 DIAGNOSIS — C3490 Malignant neoplasm of unspecified part of unspecified bronchus or lung: Secondary | ICD-10-CM

## 2013-03-30 NOTE — Telephone Encounter (Signed)
Spoke with pt regarding appt with Dr. Dorris Fetch.  He verbalized understanding of time and place

## 2013-03-30 NOTE — Progress Notes (Signed)
PCP is Ailene Ravel, MD Referring Provider is Si Gaul, MD  Chief Complaint  Patient presents with  . Lung Cancer    surgical eval for resection, PET Scan 03/03/13    HPI: 57 year old gentleman with a history tobacco abuse and previous wedge resection of the left upper lobe for a mixed small cell/non-small cell lung cancer. He was treated with chemotherapy preoperatively. He also had a wedge resection of a left lower lobe nodule at the same operation that turned out to be a benign lesion. He has been followed since the time of the surgery with periodic CT scans. A CT in January showed an 8 mm nodule in the left lung. A followup CT in April showed that the lesion had grown in size to 10 mm. A PET CT was done which showed the lesion to be hypermetabolic. There also was metabolic activity noted in the hilar nodes although the nodes themselves did not appear enlarged. He continues to smoke about 1.5 packs of cigarettes a day. He has a greater than 80-pack-year history of smoking.  Other than the CT finding he feels well. He has gained weight.  He denies any chest pain, pressure, or tightness with exertion. He does have a cough and congestion. He has had some sputum production with his cough. He denies hemoptysis.  Past Medical History  Diagnosis Date  . HTN (hypertension)   . Hypercholesteremia   . COPD (chronic obstructive pulmonary disease)   . Lung cancer     Past Surgical History  Procedure Laterality Date  . Left video-assisted thoracoscopy, mini thoracotomy; wedge, posterior segment of left upper lobe; wedge, anterior segment of left lower lobe with node dissection.  09/02/11    [facility]    No family history on file.  Social History History  Substance Use Topics  . Smoking status: Current Every Day Smoker -- 2.00 packs/day for 40 years    Types: Cigarettes  . Smokeless tobacco: Not on file  . Alcohol Use: 3 - 6 oz/week    6-12 drink(s) per week    Current Outpatient  Prescriptions  Medication Sig Dispense Refill  . albuterol (VENTOLIN HFA) 108 (90 BASE) MCG/ACT inhaler INHALE 1 TO 2 PUFFS BY MOUTH EVERY 4 HOURS AS NEEDED  18 each  0  . ALPRAZolam (XANAX) 0.25 MG tablet Take 1 tablet (0.25 mg total) by mouth at bedtime as needed for sleep.  30 tablet  0   No current facility-administered medications for this visit.    No Known Allergies  Review of Systems  Constitutional: Positive for activity change. Negative for unexpected weight change (Has gained weight).  Respiratory: Positive for cough. Negative for shortness of breath and wheezing.   Cardiovascular: Negative for chest pain.  Hematological: Negative for adenopathy. Does not bruise/bleed easily.  All other systems reviewed and are negative.    BP 153/106  Pulse 91  Resp 20  Ht 6\' 1"  (1.854 m)  Wt 256 lb (116.121 kg)  BMI 33.78 kg/m2  SpO2 96% Physical Exam  Vitals reviewed. Constitutional: He is oriented to person, place, and time. He appears well-developed and well-nourished. No distress.  HENT:  Head: Normocephalic and atraumatic.  Eyes: EOM are normal. Pupils are equal, round, and reactive to light.  Neck: Neck supple. No thyromegaly present.  Cardiovascular: Normal rate, regular rhythm, normal heart sounds and intact distal pulses.  Exam reveals no gallop and no friction rub.   No murmur heard. Pulmonary/Chest: Effort normal and breath sounds normal.  Well-healed  surgical scars left chest  Abdominal: Soft. There is no tenderness.  Musculoskeletal: He exhibits no edema.  Lymphadenopathy:    He has no cervical adenopathy.  Neurological: He is alert and oriented to person, place, and time. No cranial nerve deficit.  Skin: Skin is warm and dry.     Diagnostic Tests: CT CHEST WITH CONTRAST  Technique: Multidetector CT imaging of the chest was performed  following the standard protocol during bolus administration of  intravenous contrast.  Contrast: 80mL OMNIPAQUE IOHEXOL 300  MG/ML SOLN  Comparison: 11/23/2012  Findings: There is no axillary lymphadenopathy. No mediastinal or  hilar lymphadenopathy. Heart size is normal. No pericardial or  pleural effusion.  Small focus of ground-glass attenuation in the central right upper  lobe is unchanged. Tiny right lower lobe nodular density on image  50 and is also stable. 8 mm left lower lobe pulmonary nodule has  increased in size to 10 mm in the interval. Architectural  distortion with suture material in the left lung is consistent with  the previous surgery.  Images which include the upper abdomen show fatty changes in the  liver. No evidence for adrenal mass. Bone windows reveal no  worrisome lytic or sclerotic osseous lesions.  IMPRESSION:  Postsurgical change in the left lung with continued progression of  a left lower lobe pulmonary nodule, now measuring 10 mm in  diameter. Given the continued progression over multiple exams ( 4  mm on 07/24/2012 to 8 mm on 11/23/2012 and now to 10 mm on  02/22/2013), disease recurrence is a distinct concern. PET CT may  prove helpful to further evaluate.    NUCLEAR MEDICINE PET SKULL BASE TO THIGH  Fasting Blood Glucose: 112  Technique: 19.8 mCi F-18 FDG was injected intravenously. CT data  was obtained and used for attenuation correction and anatomic  localization only. (This was not acquired as a diagnostic CT  examination.) Additional exam technical data entered on  technologist worksheet.  Comparison: Chest CT 02/22/2013. PET CT 08/22/2011.  Findings:  Neck: No hypermetabolic lymph nodes in the neck. Small amount of  mucosal thickening in the left maxillary sinus. Small air fluid  level layering dependently in the right maxillary sinus.  Chest: Postoperative changes of prior left lower lobe wedge  resection and noted. In close proximity to the inferior aspect of  the resection margin there is a 11 mm hypermetabolic nodule (SUVmax  = 5.9). There are multiple small  foci of hypermetabolism in and  around the left hilar region which are difficult to discretely  visualize on the noncontrast CT images, although one of these  adjacent to the left inferior pulmonary vein is only 9 mm in short  axis (image 104 of series 2). These areas are all mildly  hypermetabolic (SUVmax = 4.9 - 6.6), and are concerning for  potential small malignant lymph nodes. There is also a 4 mm nodule  in the right lower lobe (image 115 of series 2) which is similar to  prior studies dating back to 2012, and presumably benign. There  are a few small areas of ground-glass attenuation in the superior  segment of the right lower lobe, as well as a right upper lobe,  which are highly nonspecific, but similar to prior examinations.  There is atherosclerosis of the thoracic aorta, the great vessels  of the mediastinum and the coronary arteries, including calcified  atherosclerotic plaque in the left main, left anterior descending,  left circumflex and right coronary arteries.  Abdomen/Pelvis:  No abnormal hypermetabolic activity within the  liver, pancreas, adrenal glands, or spleen. No hypermetabolic  lymph nodes in the abdomen or pelvis. Atherosclerotic  calcifications throughout the abdominal and pelvic vasculature,  without definite aneurysm. There are a few scattered colonic  diverticula, without surrounding inflammatory changes to suggest  acute diverticulitis at this time. Normal appendix. Prostate and  urinary bladder are unremarkable in appearance.  Skeleton: No focal hypermetabolic activity to suggest skeletal  metastasis. Post thoracotomy changes in the left ninth rib  posterolaterally.  IMPRESSION:  1. 11 mm hypermetabolic left lower lobe nodule with multiple small  left hilar lymph nodes which demonstrate mild hypermetabolism.  Findings are concerning for local recurrence of disease with left  hilar nodal spread. Clinical correlation is recommended.  2. Paranasal sinus  disease, as above, including a small air fluid  level the right maxillary sinus. Clinical correlation for signs  and symptoms of acute sinusitis is recommended.  3. Atherosclerosis, including left main and three-vessel coronary  artery disease. Assessment for potential risk factor  modification, dietary therapy or pharmacologic therapy may be  warranted, if clinically indicated.  4. Colonic diverticulosis without findings to suggest acute  diverticulitis at this time.    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 He has previously been treated with chemotherapy followed by wedge resection. He now has a 10 mm nodule in the left lower lobe. This has increased in size since January and is hypermetabolic by PET. It is highly suspicious for recurrent lung cancer. There also is some hypermetabolic activity in the left hilum although that is not any pathologically enlarged lymph nodes on the scan to correlate with that finding.  He continues to smoke about 1.5 packs of cigarettes daily, down from his usual 2 packs prior to his surgery.   This lesion is relatively small and peripheral. It would be hard to approach with a percutaneous needle biopsy or navigational bronchoscopy. With either technique a negative result could not be considered reliable. He does appear to be a surgical candidate, although we do need to have his pulmonary function testing repeated. This would not be an easy operation by any stretch of the imagination and may require a full thoracotomy.   I did have a very frank discussion with [name] [name] that it doesn't really make sense to continue to operate on him if he is going to continue to smoke. He seems to understand that and says that he will quit smoking.   He is just recovering from bronchitis and one wait until after that is resolved before we checked his pulmonary function tests.  Plan: Patient will quit  smoking  I will see him back in 3 weeks to check on the status of his smoking cessation.  We'll plan to do pulmonary function testing just prior to his return appointment to allow him to get over his current respiratory illness

## 2013-04-20 ENCOUNTER — Ambulatory Visit (HOSPITAL_COMMUNITY)
Admission: RE | Admit: 2013-04-20 | Discharge: 2013-04-20 | Disposition: A | Discharge status: Home / Self Care | Payer: BC Managed Care – PPO | Source: Ambulatory Visit | Attending: Thoracic Surgery (Cardiothoracic Vascular Surgery) | Admitting: Thoracic Surgery (Cardiothoracic Vascular Surgery)

## 2013-04-20 ENCOUNTER — Ambulatory Visit (INDEPENDENT_AMBULATORY_CARE_PROVIDER_SITE_OTHER): Payer: BC Managed Care – PPO | Admitting: Thoracic Surgery (Cardiothoracic Vascular Surgery)

## 2013-04-20 ENCOUNTER — Encounter: Payer: Self-pay | Admitting: Thoracic Surgery (Cardiothoracic Vascular Surgery)

## 2013-04-20 ENCOUNTER — Other Ambulatory Visit: Payer: Self-pay | Admitting: *Deleted

## 2013-04-20 DIAGNOSIS — F17200 Nicotine dependence, unspecified, uncomplicated: Secondary | ICD-10-CM

## 2013-04-20 DIAGNOSIS — J449 Chronic obstructive pulmonary disease, unspecified: Secondary | ICD-10-CM

## 2013-04-20 DIAGNOSIS — R918 Other nonspecific abnormal finding of lung field: Secondary | ICD-10-CM

## 2013-04-20 DIAGNOSIS — C3490 Malignant neoplasm of unspecified part of unspecified bronchus or lung: Secondary | ICD-10-CM

## 2013-04-20 MED ORDER — ALBUTEROL SULFATE (5 MG/ML) 0.5% IN NEBU
2.5000 mg | INHALATION_SOLUTION | Freq: Once | RESPIRATORY_TRACT | Status: AC
  Administered 2013-04-20: 2.5 mg via RESPIRATORY_TRACT

## 2013-04-20 NOTE — Progress Notes (Signed)
HPI:  [name] [name] returns for a scheduled followup visit to discuss treatment of his left lower lobe mass and lymph nodes.  He is a 57-year-old gentleman with a history tobacco abuse lung cancer.  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 He was treated with chemotherapy and then had a wedge resection. He also had a wedge resection of a left lower lobe nodule at the same operation that turned out to be a benign lesion. He has been followed since then with CT scans. A CT in January showed an 8 mm nodule in the left lung. A followup CT in April showed that the lesion had grown in size to 10 mm. A PET CT was done which showed the lesion to be hypermetabolic. There also was metabolic activity noted in the hilar nodes although the nodes themselves did not appear enlarged. I saw him 3 weeks ago. At that time I discussed with them the importance of smoking cessation. He was smoking about 1.5 packs of cigarettes daily at that time. Overall he has a greater than 80-pack-year history of smoking. I recommended to him that he quit smoking and he now returns to further discuss potential treatment for the left lower lobe mass.  He says that he continued to smoke for the first week after a visit. He then started trying to cut back over the next week. He says over the past week he has only had 2 cigarettes. He does still have cravings.  Past Medical History  Diagnosis Date  . HTN (hypertension)   . Hypercholesteremia   . COPD (chronic obstructive pulmonary disease)   . Lung cancer        Current Outpatient Prescriptions  Medication Sig Dispense Refill  . albuterol (VENTOLIN HFA) 108 (90 BASE) MCG/ACT inhaler INHALE 1 TO 2 PUFFS BY MOUTH EVERY 4 HOURS AS NEEDED  18 each  0  . ALPRAZolam (XANAX) 0.25 MG tablet Take 1 tablet (0.25 mg total) by mouth at bedtime as needed for sleep.  30 tablet  0  . aspirin 81 MG tablet Take 81 mg by mouth daily.      . hydrochlorothiazide  (HYDRODIURIL) 25 MG tablet Take 25 mg by mouth daily.       No current facility-administered medications for this visit.    Physical Exam BP 157/98  Pulse 84  Resp 20  Ht 6' 1" (1.854 m)  Wt 256 lb (116.121 kg)  BMI 33.78 kg/m2  SpO2 97% Obese 57-year-old male in no acute distress Neurologic alert and oriented x3 with no focal deficits Neck no cervical or suprapubic or adenopathy Lungs clear bilaterally Well-healed thoracotomy incision Cardiac regular rate and rhythm normal S1 and S2 Abdomen obese soft nontender 2+ pulses throughout No peripheral edema  Diagnostic Tests: Pulmonary function testing FVC 3.54 (67%) FEV1 2.05 (51%) FEV1 post bronchodilator 2.40 (60%) FEV1 to FVC ratio 58% DLCO 95%  PET/CT 03/03/13 *RADIOLOGY REPORT*  Clinical Data: Subsequent treatment strategy for lung cancer.  Restaging scan.  NUCLEAR MEDICINE PET SKULL BASE TO THIGH  Fasting Blood Glucose: 112  Technique: 19.8 mCi F-18 FDG was injected intravenously. CT data  was obtained and used for attenuation correction and anatomic  localization only. (This was not acquired as a diagnostic CT  examination.) Additional exam technical data entered on  technologist worksheet.  Comparison: Chest CT 02/22/2013. PET CT 08/22/2011.  Findings:  Neck: No hypermetabolic lymph nodes in the neck. Small amount of  mucosal   thickening in the left maxillary sinus. Small air fluid  level layering dependently in the right maxillary sinus.  Chest: Postoperative changes of prior left lower lobe wedge  resection and noted. In close proximity to the inferior aspect of  the resection margin there is a 11 mm hypermetabolic nodule (SUVmax  = 5.9). There are multiple small foci of hypermetabolism in and  around the left hilar region which are difficult to discretely  visualize on the noncontrast CT images, although one of these  adjacent to the left inferior pulmonary vein is only 9 mm in short  axis (image 104 of  series 2). These areas are all mildly  hypermetabolic (SUVmax = 4.9 - 6.6), and are concerning for  potential small malignant lymph nodes. There is also a 4 mm nodule  in the right lower lobe (image 115 of series 2) which is similar to  prior studies dating back to 2012, and presumably benign. There  are a few small areas of ground-glass attenuation in the superior  segment of the right lower lobe, as well as a right upper lobe,  which are highly nonspecific, but similar to prior examinations.  There is atherosclerosis of the thoracic aorta, the great vessels  of the mediastinum and the coronary arteries, including calcified  atherosclerotic plaque in the left main, left anterior descending,  left circumflex and right coronary arteries.  Abdomen/Pelvis: No abnormal hypermetabolic activity within the  liver, pancreas, adrenal glands, or spleen. No hypermetabolic  lymph nodes in the abdomen or pelvis. Atherosclerotic  calcifications throughout the abdominal and pelvic vasculature,  without definite aneurysm. There are a few scattered colonic  diverticula, without surrounding inflammatory changes to suggest  acute diverticulitis at this time. Normal appendix. Prostate and  urinary bladder are unremarkable in appearance.  Skeleton: No focal hypermetabolic activity to suggest skeletal  metastasis. Post thoracotomy changes in the left ninth rib  posterolaterally.  IMPRESSION:  1. 11 mm hypermetabolic left lower lobe nodule with multiple small  left hilar lymph nodes which demonstrate mild hypermetabolism.  Findings are concerning for local recurrence of disease with left  hilar nodal spread. Clinical correlation is recommended.  2. Paranasal sinus disease, as above, including a small air fluid  level the right maxillary sinus. Clinical correlation for signs  and symptoms of acute sinusitis is recommended.  3. Atherosclerosis, including left main and three-vessel coronary  artery disease.  Assessment for potential risk factor  modification, dietary therapy or pharmacologic therapy may be  warranted, if clinically indicated.  4. Colonic diverticulosis without findings to suggest acute  diverticulitis at this time.  Original Report Authenticated By: Daniel Entrikin, M.D.  Impression: 57-year-old gentleman with a history of lung cancer who now has a new, enlarging left lower lobe mass hypermetabolic hilar and AP window nodes. This is concerning for a new lung cancer potentially stage II or 3 depending on the status of the lymph nodes. The lesion is small and peripheral and would be subject to high rate of false negative results with attempted needle biopsy or bronchoscopic biopsy. Given his young age and his desire to be aggressive, I think the best option is to proceed with wedge resection for diagnostic purposes and possible segmentectomy or lobectomy and lymph node dissection. This has potential to be a difficult operation as it is a redo. However he is young and does have adequate pulmonary function to tolerate a resection.  He did have some finding of atherosclerosis in his coronaries on CT scan. That is   a common finding on CT these days. I questioned him extensively regarding any potential cardiac symptoms. He really does not have any symptoms that can be attributed to his heart. I think the yield for further cardiac workup is likely to be extremely low and he should be able to tolerate a pulmonary resection. He and his wife do understand that he is at increased risk for cardiac complications given that finding.  I discussed with them the general nature of the procedure including the incisions to be used the need for general anesthesia. We discussed the expected hospital stay. They understand that no guarantee of cure can be given. We discussed the indications, risks, benefits, and alternatives. They understand the risk include, but are not limited to, death, MI, DVT, PE, bleeding,  possible need for transfusion, infection, prolonged air leak, cardiac arrhythmias, and other unforeseeable complications. She understands and accepts the risk and strongly wishes to proceed.  Plan: Redo left VATS, possible thoracotomy, possible segmentectomy or lobectomy on Monday, June 23.   

## 2013-04-27 ENCOUNTER — Encounter (HOSPITAL_COMMUNITY): Payer: Self-pay | Admitting: Pharmacy Technician

## 2013-04-29 ENCOUNTER — Encounter (HOSPITAL_COMMUNITY)
Admission: RE | Admit: 2013-04-29 | Discharge: 2013-04-29 | Disposition: A | Discharge status: Home / Self Care | Payer: BC Managed Care – PPO | Source: Ambulatory Visit | Attending: Thoracic Surgery (Cardiothoracic Vascular Surgery) | Admitting: Thoracic Surgery (Cardiothoracic Vascular Surgery)

## 2013-04-29 ENCOUNTER — Encounter (HOSPITAL_COMMUNITY): Payer: Self-pay

## 2013-04-29 VITALS — BP 128/82 | HR 110 | Temp 97.9°F | Resp 20 | Ht 73.0 in | Wt 254.6 lb

## 2013-04-29 DIAGNOSIS — G4733 Obstructive sleep apnea (adult) (pediatric): Secondary | ICD-10-CM | POA: Insufficient documentation

## 2013-04-29 DIAGNOSIS — Z01810 Encounter for preprocedural cardiovascular examination: Secondary | ICD-10-CM | POA: Insufficient documentation

## 2013-04-29 DIAGNOSIS — Z01812 Encounter for preprocedural laboratory examination: Secondary | ICD-10-CM | POA: Insufficient documentation

## 2013-04-29 DIAGNOSIS — Z01818 Encounter for other preprocedural examination: Secondary | ICD-10-CM | POA: Insufficient documentation

## 2013-04-29 DIAGNOSIS — R918 Other nonspecific abnormal finding of lung field: Secondary | ICD-10-CM

## 2013-04-29 HISTORY — DX: Insomnia, unspecified: G47.00

## 2013-04-29 HISTORY — DX: Shortness of breath: R06.02

## 2013-04-29 LAB — COMPREHENSIVE METABOLIC PANEL
ALT: 30 U/L (ref 0–53)
AST: 22 U/L (ref 0–37)
Alkaline Phosphatase: 87 U/L (ref 39–117)
CO2: 22 mEq/L (ref 19–32)
Calcium: 9.2 mg/dL (ref 8.4–10.5)
Chloride: 101 mEq/L (ref 96–112)
GFR calc Af Amer: >90 mL/min (ref >90)
GFR calc non Af Amer: >90 mL/min (ref >90)
Glucose, Bld: 94 mg/dL (ref 70–99)
Sodium: 134 mEq/L — ABNORMAL LOW (ref 135–145)
Total Bilirubin: 0.3 mg/dL (ref 0.3–1.2)

## 2013-04-29 LAB — SURGICAL PCR SCREEN
MRSA, PCR: NEGATIVE
Staphylococcus aureus: NEGATIVE

## 2013-04-29 LAB — BLOOD GAS, ARTERIAL
Acid-Base Excess: 0.7 mmol/L (ref 0.0–2.0)
Bicarbonate: 24.2 mEq/L — ABNORMAL HIGH (ref 20.0–24.0)
O2 Saturation: 98.3 %
pCO2 arterial: 34.9 mmHg — ABNORMAL LOW (ref 35.0–45.0)
pO2, Arterial: 86.7 mmHg (ref 80.0–100.0)

## 2013-04-29 LAB — URINALYSIS, ROUTINE W REFLEX MICROSCOPIC
Glucose, UA: NEGATIVE mg/dL
Hgb urine dipstick: NEGATIVE
Leukocytes, UA: NEGATIVE
Protein, ur: NEGATIVE mg/dL
Specific Gravity, Urine: 1.015 (ref 1.005–1.030)
pH: 5 (ref 5.0–8.0)

## 2013-04-29 LAB — CBC
Hemoglobin: 15.3 g/dL (ref 13.0–17.0)
MCH: 28.3 pg (ref 26.0–34.0)
Platelets: 262 10*3/uL (ref 150–400)
RBC: 5.41 MIL/uL (ref 4.22–5.81)
WBC: 8.9 10*3/uL (ref 4.0–10.5)

## 2013-04-29 NOTE — Pre-Procedure Instructions (Signed)
[name] [name]  04/29/2013   Your procedure is scheduled on:  Mon, June 23 @ 7:30 AM  Report to [facility] at 5:30 AM.  Call this number if you have problems the morning of surgery: [phone]   Remember:   Do not eat food or drink liquids after midnight.   Take these medicines the morning of surgery with A SIP OF WATER: Albuterol<Bring Your Inhaler With You> and Alprazolam(Xanax)   Do not wear jewelry  Do not wear lotions, powders, or colognes. You may wear deodorant.  Men may shave face and neck.  Do not bring valuables to the hospital.  The [facility] is not responsible                   for any belongings or valuables.  Contacts, dentures or bridgework may not be worn into surgery.  Leave suitcase in the car. After surgery it may be brought to your room.  For patients admitted to the hospital, checkout time is 11:00 AM the day of  discharge.     Special Instructions: Shower using CHG 2 nights before surgery and the night before surgery.  If you shower the day of surgery use CHG.  Use special wash - you have one bottle of CHG for all showers.  You should use approximately 1/3 of the bottle for each shower.   Please read over the following fact sheets that you were given: Pain Booklet, Coughing and Deep Breathing, Blood Transfusion Information, MRSA Information and Surgical Site Infection Prevention

## 2013-04-29 NOTE — Progress Notes (Signed)
Pt doesn't have a cardiologist  Denies ever having an echo/stress test/heart cath  Medical Md Dr. Burnell Blanks in [place]

## 2013-04-29 NOTE — Progress Notes (Signed)
04/29/13 1033  OBSTRUCTIVE SLEEP APNEA  Have you ever been diagnosed with sleep apnea through a sleep study? No  Do you snore loudly (loud enough to be heard through closed doors)?  0  Do you often feel tired, fatigued, or sleepy during the daytime? 0  Has anyone observed you stop breathing during your sleep? 0  Do you have, or are you being treated for high blood pressure? 1  BMI more than 35 kg/m2? 0  Age over 56 years old? 1  Neck circumference greater than 40 cm/18 inches? 1 (20)  Gender: 1  Obstructive Sleep Apnea Score 4  Score 4 or greater  Results sent to PCP

## 2013-05-02 MED ORDER — DEXTROSE 5 % IV SOLN
1.5000 g | INTRAVENOUS | Status: AC
  Administered 2013-05-03: 1.5 g via INTRAVENOUS
  Administered 2013-05-03: .75 g via INTRAVENOUS
  Filled 2013-05-02: qty 1.5

## 2013-05-03 ENCOUNTER — Encounter (HOSPITAL_COMMUNITY): Payer: Self-pay | Admitting: Anesthesiology

## 2013-05-03 ENCOUNTER — Inpatient Hospital Stay (HOSPITAL_COMMUNITY): Payer: BC Managed Care – PPO

## 2013-05-03 ENCOUNTER — Inpatient Hospital Stay (HOSPITAL_COMMUNITY)
Admission: RE | Admit: 2013-05-03 | Discharge: 2013-05-07 | DRG: 075 | Disposition: A | Discharge status: Home / Self Care | Payer: BC Managed Care – PPO | Source: Ambulatory Visit | Attending: Thoracic Surgery (Cardiothoracic Vascular Surgery) | Admitting: Thoracic Surgery (Cardiothoracic Vascular Surgery)

## 2013-05-03 ENCOUNTER — Encounter (HOSPITAL_COMMUNITY)
Admission: RE | Disposition: A | Discharge status: Home / Self Care | Payer: Self-pay | Source: Ambulatory Visit | Attending: Thoracic Surgery (Cardiothoracic Vascular Surgery)

## 2013-05-03 ENCOUNTER — Encounter (HOSPITAL_COMMUNITY): Payer: Self-pay | Admitting: *Deleted

## 2013-05-03 ENCOUNTER — Inpatient Hospital Stay (HOSPITAL_COMMUNITY): Payer: BC Managed Care – PPO | Admitting: Anesthesiology

## 2013-05-03 DIAGNOSIS — F17200 Nicotine dependence, unspecified, uncomplicated: Secondary | ICD-10-CM | POA: Diagnosis present

## 2013-05-03 DIAGNOSIS — IMO0002 Reserved for concepts with insufficient information to code with codable children: Secondary | ICD-10-CM

## 2013-05-03 DIAGNOSIS — C3430 Malignant neoplasm of lower lobe, unspecified bronchus or lung: Secondary | ICD-10-CM

## 2013-05-03 DIAGNOSIS — C3490 Malignant neoplasm of unspecified part of unspecified bronchus or lung: Secondary | ICD-10-CM | POA: Diagnosis present

## 2013-05-03 DIAGNOSIS — I10 Essential (primary) hypertension: Secondary | ICD-10-CM | POA: Diagnosis present

## 2013-05-03 DIAGNOSIS — C3410 Malignant neoplasm of upper lobe, unspecified bronchus or lung: Principal | ICD-10-CM | POA: Diagnosis present

## 2013-05-03 DIAGNOSIS — K5730 Diverticulosis of large intestine without perforation or abscess without bleeding: Secondary | ICD-10-CM | POA: Diagnosis present

## 2013-05-03 DIAGNOSIS — Z9221 Personal history of antineoplastic chemotherapy: Secondary | ICD-10-CM

## 2013-05-03 DIAGNOSIS — Z7982 Long term (current) use of aspirin: Secondary | ICD-10-CM

## 2013-05-03 DIAGNOSIS — R948 Abnormal results of function studies of other organs and systems: Secondary | ICD-10-CM | POA: Diagnosis present

## 2013-05-03 DIAGNOSIS — I700 Atherosclerosis of aorta: Secondary | ICD-10-CM | POA: Diagnosis present

## 2013-05-03 DIAGNOSIS — R918 Other nonspecific abnormal finding of lung field: Secondary | ICD-10-CM

## 2013-05-03 DIAGNOSIS — I2510 Atherosclerotic heart disease of native coronary artery without angina pectoris: Secondary | ICD-10-CM | POA: Diagnosis present

## 2013-05-03 HISTORY — PX: THORACOTOMY: SHX5074

## 2013-05-03 HISTORY — PX: LOBECTOMY: SHX5089

## 2013-05-03 HISTORY — PX: WEDGE RESECTION: SHX5070

## 2013-05-03 HISTORY — PX: VIDEO ASSISTED THORACOSCOPY: SHX5073

## 2013-05-03 SURGERY — VIDEO ASSISTED THORACOSCOPY
Anesthesia: General | Site: Chest | Laterality: Left | Wound class: Clean Contaminated

## 2013-05-03 MED ORDER — DIPHENHYDRAMINE HCL 50 MG/ML IJ SOLN: 12.5000 mg | Freq: Four times a day (QID) | INTRAMUSCULAR | Status: DC | PRN

## 2013-05-03 MED ORDER — OXYCODONE-ACETAMINOPHEN 5-325 MG PO TABS
1.0000 | ORAL_TABLET | ORAL | Status: DC | PRN
  Administered 2013-05-04 – 2013-05-07 (×9): 2 via ORAL
  Filled 2013-05-03 (×10): qty 2

## 2013-05-03 MED ORDER — PROMETHAZINE HCL 25 MG/ML IJ SOLN: 6.2500 mg | INTRAMUSCULAR | Status: DC | PRN

## 2013-05-03 MED ORDER — SODIUM CHLORIDE 0.9 % IJ SOLN
9.0000 mL | INTRAMUSCULAR | Status: DC | PRN
  Administered 2013-05-05: 10:00:00 via INTRAVENOUS

## 2013-05-03 MED ORDER — PHENYLEPHRINE HCL 10 MG/ML IJ SOLN
INTRAMUSCULAR | Status: DC | PRN
  Administered 2013-05-03 (×2): 40 ug via INTRAVENOUS

## 2013-05-03 MED ORDER — TRAMADOL HCL 50 MG PO TABS
50.0000 mg | ORAL_TABLET | Freq: Four times a day (QID) | ORAL | Status: DC | PRN
  Administered 2013-05-04 – 2013-05-05 (×2): 100 mg via ORAL
  Administered 2013-05-07: 50 mg via ORAL
  Filled 2013-05-03 (×2): qty 2
  Filled 2013-05-03: qty 1

## 2013-05-03 MED ORDER — ONDANSETRON HCL 4 MG/2ML IJ SOLN
4.0000 mg | Freq: Four times a day (QID) | INTRAMUSCULAR | Status: DC | PRN
  Administered 2013-05-04: 4 mg via INTRAVENOUS

## 2013-05-03 MED ORDER — GLYCOPYRROLATE 0.2 MG/ML IJ SOLN
INTRAMUSCULAR | Status: DC | PRN
  Administered 2013-05-03: 0.6 mg via INTRAVENOUS

## 2013-05-03 MED ORDER — 0.9 % SODIUM CHLORIDE (POUR BTL) OPTIME
TOPICAL | Status: DC | PRN
  Administered 2013-05-03: 3000 mL

## 2013-05-03 MED ORDER — FENTANYL CITRATE 0.05 MG/ML IJ SOLN: 50.0000 ug | Freq: Once | INTRAMUSCULAR | Status: DC

## 2013-05-03 MED ORDER — FENTANYL CITRATE 0.05 MG/ML IJ SOLN
INTRAMUSCULAR | Status: DC | PRN
  Administered 2013-05-03: 100 ug via INTRAVENOUS
  Administered 2013-05-03 (×2): 50 ug via INTRAVENOUS
  Administered 2013-05-03: 100 ug via INTRAVENOUS
  Administered 2013-05-03 (×4): 50 ug via INTRAVENOUS
  Administered 2013-05-03: 100 ug via INTRAVENOUS
  Administered 2013-05-03: 50 ug via INTRAVENOUS
  Administered 2013-05-03: 100 ug via INTRAVENOUS

## 2013-05-03 MED ORDER — BISACODYL 5 MG PO TBEC
10.0000 mg | DELAYED_RELEASE_TABLET | Freq: Every day | ORAL | Status: DC
  Administered 2013-05-04 – 2013-05-05 (×2): 10 mg via ORAL
  Filled 2013-05-03 (×2): qty 2

## 2013-05-03 MED ORDER — SENNOSIDES-DOCUSATE SODIUM 8.6-50 MG PO TABS
1.0000 | ORAL_TABLET | Freq: Every evening | ORAL | Status: DC | PRN
  Filled 2013-05-03: qty 1

## 2013-05-03 MED ORDER — PANTOPRAZOLE SODIUM 40 MG PO TBEC
40.0000 mg | DELAYED_RELEASE_TABLET | Freq: Every day | ORAL | Status: DC
  Administered 2013-05-03 – 2013-05-07 (×5): 40 mg via ORAL
  Filled 2013-05-03 (×5): qty 1

## 2013-05-03 MED ORDER — ASPIRIN EC 81 MG PO TBEC
81.0000 mg | DELAYED_RELEASE_TABLET | Freq: Every day | ORAL | Status: DC
  Administered 2013-05-04 – 2013-05-07 (×4): 81 mg via ORAL
  Filled 2013-05-03 (×4): qty 1

## 2013-05-03 MED ORDER — ONDANSETRON HCL 4 MG/2ML IJ SOLN
INTRAMUSCULAR | Status: DC | PRN
  Administered 2013-05-03: 4 mg via INTRAVENOUS

## 2013-05-03 MED ORDER — VECURONIUM BROMIDE 10 MG IV SOLR
INTRAVENOUS | Status: DC | PRN
  Administered 2013-05-03 (×2): 4 mg via INTRAVENOUS
  Administered 2013-05-03: 3 mg via INTRAVENOUS
  Administered 2013-05-03 (×4): 2 mg via INTRAVENOUS
  Administered 2013-05-03: 1 mg via INTRAVENOUS
  Administered 2013-05-03: 2 mg via INTRAVENOUS

## 2013-05-03 MED ORDER — FENTANYL 10 MCG/ML IV SOLN
INTRAVENOUS | Status: DC
  Administered 2013-05-03: 190 ug via INTRAVENOUS
  Administered 2013-05-03: 14:00:00 via INTRAVENOUS
  Administered 2013-05-03: 46.01 ug via INTRAVENOUS
  Administered 2013-05-04: 170 ug via INTRAVENOUS
  Administered 2013-05-04: 09:00:00 via INTRAVENOUS
  Administered 2013-05-04: 130 ug via INTRAVENOUS
  Administered 2013-05-04: 50 ug via INTRAVENOUS
  Administered 2013-05-04: 22:00:00 via INTRAVENOUS
  Administered 2013-05-04: 290 ug via INTRAVENOUS
  Administered 2013-05-04: 100 ug via INTRAVENOUS
  Administered 2013-05-04: 370 ug via INTRAVENOUS
  Administered 2013-05-05: 50 ug via INTRAVENOUS
  Administered 2013-05-05: 60 ug via INTRAVENOUS
  Administered 2013-05-05: 80 ug via INTRAVENOUS
  Administered 2013-05-05: 50 ug via INTRAVENOUS
  Administered 2013-05-05 – 2013-05-06 (×2): 10 ug via INTRAVENOUS
  Administered 2013-05-06: 60 ug via INTRAVENOUS
  Filled 2013-05-03 (×4): qty 50

## 2013-05-03 MED ORDER — EPHEDRINE SULFATE 50 MG/ML IJ SOLN
INTRAMUSCULAR | Status: DC | PRN
  Administered 2013-05-03: 2.5 mg via INTRAVENOUS

## 2013-05-03 MED ORDER — DIPHENHYDRAMINE HCL 12.5 MG/5ML PO ELIX
12.5000 mg | ORAL_SOLUTION | Freq: Four times a day (QID) | ORAL | Status: DC | PRN
  Filled 2013-05-03: qty 5

## 2013-05-03 MED ORDER — LABETALOL HCL 5 MG/ML IV SOLN
INTRAVENOUS | Status: DC | PRN
  Administered 2013-05-03 (×5): 5 mg via INTRAVENOUS
  Administered 2013-05-03: 10 mg via INTRAVENOUS

## 2013-05-03 MED ORDER — LIDOCAINE HCL (CARDIAC) 20 MG/ML IV SOLN
INTRAVENOUS | Status: DC | PRN
  Administered 2013-05-03: 100 mg via INTRAVENOUS

## 2013-05-03 MED ORDER — LACTATED RINGERS IV SOLN
INTRAVENOUS | Status: DC | PRN
  Administered 2013-05-03 (×2): via INTRAVENOUS

## 2013-05-03 MED ORDER — OXYCODONE HCL 5 MG PO TABS: 5.0000 mg | ORAL_TABLET | Freq: Once | ORAL | Status: DC | PRN

## 2013-05-03 MED ORDER — NEOSTIGMINE METHYLSULFATE 1 MG/ML IJ SOLN
INTRAMUSCULAR | Status: DC | PRN
  Administered 2013-05-03: 4 mg via INTRAVENOUS

## 2013-05-03 MED ORDER — ONDANSETRON HCL 4 MG/2ML IJ SOLN
4.0000 mg | Freq: Four times a day (QID) | INTRAMUSCULAR | Status: DC | PRN
  Filled 2013-05-03: qty 2

## 2013-05-03 MED ORDER — NALOXONE HCL 0.4 MG/ML IJ SOLN: 0.4000 mg | INTRAMUSCULAR | Status: DC | PRN

## 2013-05-03 MED ORDER — ACETAMINOPHEN 10 MG/ML IV SOLN
1000.0000 mg | Freq: Four times a day (QID) | INTRAVENOUS | Status: AC
  Administered 2013-05-03 – 2013-05-04 (×4): 1000 mg via INTRAVENOUS
  Filled 2013-05-03 (×5): qty 100

## 2013-05-03 MED ORDER — OXYCODONE HCL 5 MG/5ML PO SOLN: 5.0000 mg | Freq: Once | ORAL | Status: DC | PRN

## 2013-05-03 MED ORDER — SUCCINYLCHOLINE CHLORIDE 20 MG/ML IJ SOLN
INTRAMUSCULAR | Status: DC | PRN
  Administered 2013-05-03: 160 mg via INTRAVENOUS

## 2013-05-03 MED ORDER — ALBUTEROL SULFATE HFA 108 (90 BASE) MCG/ACT IN AERS
1.0000 | INHALATION_SPRAY | RESPIRATORY_TRACT | Status: DC | PRN
  Administered 2013-05-04: 2 via RESPIRATORY_TRACT
  Filled 2013-05-03: qty 6.7

## 2013-05-03 MED ORDER — HYDROCHLOROTHIAZIDE 25 MG PO TABS
25.0000 mg | ORAL_TABLET | Freq: Every day | ORAL | Status: DC
  Administered 2013-05-05 – 2013-05-07 (×3): 25 mg via ORAL
  Filled 2013-05-03 (×3): qty 1

## 2013-05-03 MED ORDER — ASPIRIN 81 MG PO TABS: 81.0000 mg | ORAL_TABLET | Freq: Every day | ORAL | Status: DC

## 2013-05-03 MED ORDER — HYDROMORPHONE HCL PF 1 MG/ML IJ SOLN
0.2500 mg | INTRAMUSCULAR | Status: DC | PRN
  Administered 2013-05-03 (×4): 0.5 mg via INTRAVENOUS

## 2013-05-03 MED ORDER — PROPOFOL 10 MG/ML IV BOLUS
INTRAVENOUS | Status: DC | PRN
  Administered 2013-05-03: 320 mg via INTRAVENOUS
  Administered 2013-05-03 (×2): 40 mg via INTRAVENOUS

## 2013-05-03 MED ORDER — HYDROMORPHONE HCL PF 1 MG/ML IJ SOLN
INTRAMUSCULAR | Status: AC
  Filled 2013-05-03: qty 1

## 2013-05-03 MED ORDER — MIDAZOLAM HCL 5 MG/5ML IJ SOLN
INTRAMUSCULAR | Status: DC | PRN
  Administered 2013-05-03 (×2): 2 mg via INTRAVENOUS

## 2013-05-03 MED ORDER — DEXTROSE 5 % IV SOLN
750.0000 mg | Freq: Once | INTRAVENOUS | Status: DC
  Filled 2013-05-03: qty 750

## 2013-05-03 MED ORDER — KCL IN DEXTROSE-NACL 20-5-0.9 MEQ/L-%-% IV SOLN
INTRAVENOUS | Status: DC
  Administered 2013-05-03: 16:00:00 via INTRAVENOUS
  Filled 2013-05-03 (×8): qty 1000

## 2013-05-03 MED ORDER — OXYCODONE HCL 5 MG PO TABS
5.0000 mg | ORAL_TABLET | ORAL | Status: AC | PRN
  Administered 2013-05-03 – 2013-05-04 (×4): 10 mg via ORAL
  Filled 2013-05-03 (×3): qty 2

## 2013-05-03 MED ORDER — ARTIFICIAL TEARS OP OINT
TOPICAL_OINTMENT | OPHTHALMIC | Status: DC | PRN
  Administered 2013-05-03: 1 via OPHTHALMIC

## 2013-05-03 MED ORDER — OXYCODONE HCL 5 MG PO TABS
ORAL_TABLET | ORAL | Status: AC
  Filled 2013-05-03: qty 2

## 2013-05-03 MED ORDER — LISINOPRIL 10 MG PO TABS
10.0000 mg | ORAL_TABLET | Freq: Every day | ORAL | Status: DC
  Administered 2013-05-03 – 2013-05-04 (×2): 10 mg via ORAL
  Filled 2013-05-03 (×3): qty 1

## 2013-05-03 MED ORDER — SIMVASTATIN 20 MG PO TABS
20.0000 mg | ORAL_TABLET | Freq: Every day | ORAL | Status: DC
  Administered 2013-05-03 – 2013-05-06 (×4): 20 mg via ORAL
  Filled 2013-05-03 (×5): qty 1

## 2013-05-03 MED ORDER — ALBUMIN HUMAN 5 % IV SOLN
INTRAVENOUS | Status: DC | PRN
  Administered 2013-05-03 (×2): via INTRAVENOUS

## 2013-05-03 MED ORDER — POTASSIUM CHLORIDE 10 MEQ/50ML IV SOLN
10.0000 meq | Freq: Every day | INTRAVENOUS | Status: DC | PRN
  Administered 2013-05-05: 10 meq via INTRAVENOUS
  Filled 2013-05-03: qty 100
  Filled 2013-05-03: qty 50

## 2013-05-03 MED ORDER — LACTATED RINGERS IV SOLN
INTRAVENOUS | Status: DC | PRN
  Administered 2013-05-03 (×2): via INTRAVENOUS

## 2013-05-03 MED ORDER — MIDAZOLAM HCL 2 MG/2ML IJ SOLN: 1.0000 mg | INTRAMUSCULAR | Status: DC | PRN

## 2013-05-03 SURGICAL SUPPLY — 90 items
APPLIER CLIP ROT 10 11.4 M/L (STAPLE)
CANISTER SUCTION 2500CC (MISCELLANEOUS) ×6 IMPLANT
CATH KIT ON Q 5IN SLV (PAIN MANAGEMENT) IMPLANT
CATH ROBINSON RED A/P 22FR (CATHETERS) IMPLANT
CATH THORACIC 28FR (CATHETERS) ×3 IMPLANT
CATH THORACIC 28FR RT ANG (CATHETERS) IMPLANT
CATH THORACIC 36FR (CATHETERS) IMPLANT
CATH THORACIC 36FR RT ANG (CATHETERS) ×3 IMPLANT
CLIP APPLIE ROT 10 11.4 M/L (STAPLE) IMPLANT
CLIP TI MEDIUM 6 (CLIP) ×6 IMPLANT
CLOTH BEACON ORANGE TIMEOUT ST (SAFETY) ×3 IMPLANT
CONN Y 3/8X3/8X3/8  BEN (MISCELLANEOUS) ×1
CONN Y 3/8X3/8X3/8 BEN (MISCELLANEOUS) ×2 IMPLANT
CONT SPEC 4OZ CLIKSEAL STRL BL (MISCELLANEOUS) ×12 IMPLANT
DERMABOND ADVANCED (GAUZE/BANDAGES/DRESSINGS) ×1
DERMABOND ADVANCED .7 DNX12 (GAUZE/BANDAGES/DRESSINGS) ×2 IMPLANT
DRAPE LAPAROSCOPIC ABDOMINAL (DRAPES) ×3 IMPLANT
DRAPE WARM FLUID 44X44 (DRAPE) ×3 IMPLANT
ELECT BLADE 6.5 EXT (BLADE) ×6 IMPLANT
ELECT REM PT RETURN 9FT ADLT (ELECTROSURGICAL) ×3
ELECTRODE REM PT RTRN 9FT ADLT (ELECTROSURGICAL) ×2 IMPLANT
GLOVE BIO SURGEON STRL SZ 6.5 (GLOVE) ×6 IMPLANT
GLOVE BIO SURGEON STRL SZ7 (GLOVE) ×9 IMPLANT
GLOVE BIOGEL PI IND STRL 6.5 (GLOVE) ×14 IMPLANT
GLOVE BIOGEL PI IND STRL 7.5 (GLOVE) ×2 IMPLANT
GLOVE BIOGEL PI INDICATOR 6.5 (GLOVE) ×7
GLOVE BIOGEL PI INDICATOR 7.5 (GLOVE) ×1
GLOVE EUDERMIC 7 POWDERFREE (GLOVE) ×9 IMPLANT
GLOVE SURG SS PI 7.0 STRL IVOR (GLOVE) ×3 IMPLANT
GOWN PREVENTION PLUS XLARGE (GOWN DISPOSABLE) ×12 IMPLANT
GOWN STRL NON-REIN LRG LVL3 (GOWN DISPOSABLE) ×6 IMPLANT
HANDLE STAPLE ENDO GIA SHORT (STAPLE) ×1
HEMOSTAT SURGICEL 2X14 (HEMOSTASIS) IMPLANT
KIT BASIN OR (CUSTOM PROCEDURE TRAY) ×3 IMPLANT
KIT ROOM TURNOVER OR (KITS) ×3 IMPLANT
KIT SUCTION CATH 14FR (SUCTIONS) ×3 IMPLANT
NS IRRIG 1000ML POUR BTL (IV SOLUTION) ×9 IMPLANT
PACK CHEST (CUSTOM PROCEDURE TRAY) ×3 IMPLANT
PAD ARMBOARD 7.5X6 YLW CONV (MISCELLANEOUS) ×6 IMPLANT
PENCIL BUTTON HOLSTER BLD 10FT (ELECTRODE) ×3 IMPLANT
POUCH ENDO CATCH II 15MM (MISCELLANEOUS) ×3 IMPLANT
POUCH SPECIMEN RETRIEVAL 10MM (ENDOMECHANICALS) IMPLANT
RELOAD EGIA 45 MED/THCK PURPLE (STAPLE) ×12 IMPLANT
RELOAD EGIA 45 TAN VASC (STAPLE) ×3 IMPLANT
RELOAD EGIA 60 MED/THCK PURPLE (STAPLE) ×12 IMPLANT
RELOAD EGIA BLACK ROTIC 45MM (STAPLE) ×15 IMPLANT
RELOAD EGIA TRIS TAN 45 CVD (STAPLE) ×12 IMPLANT
SCISSORS LAP 5X35 DISP (ENDOMECHANICALS) ×3 IMPLANT
SEALANT PROGEL (MISCELLANEOUS) IMPLANT
SEALANT SURG COSEAL 4ML (VASCULAR PRODUCTS) IMPLANT
SEALANT SURG COSEAL 8ML (VASCULAR PRODUCTS) IMPLANT
SOLUTION ANTI FOG 6CC (MISCELLANEOUS) ×3 IMPLANT
SPECIMEN JAR LG PLASTIC EMPTY (MISCELLANEOUS) IMPLANT
SPECIMEN JAR MEDIUM (MISCELLANEOUS) ×3 IMPLANT
SPONGE GAUZE 4X4 12PLY (GAUZE/BANDAGES/DRESSINGS) ×3 IMPLANT
SPONGE INTESTINAL PEANUT (DISPOSABLE) ×6 IMPLANT
STAPLER ENDO GIA 12MM SHORT (STAPLE) ×2 IMPLANT
STAPLER VISISTAT 35W (STAPLE) IMPLANT
SUT PROLENE 4 0 RB 1 (SUTURE)
SUT PROLENE 4-0 RB1 .5 CRCL 36 (SUTURE) IMPLANT
SUT SILK  1 MH (SUTURE) ×2
SUT SILK 1 MH (SUTURE) ×4 IMPLANT
SUT SILK 2 0SH CR/8 30 (SUTURE) ×3 IMPLANT
SUT SILK 3 0SH CR/8 30 (SUTURE) IMPLANT
SUT VIC AB 1 CTX 36 (SUTURE) ×1
SUT VIC AB 1 CTX36XBRD ANBCTR (SUTURE) ×2 IMPLANT
SUT VIC AB 2-0 CT1 27 (SUTURE)
SUT VIC AB 2-0 CT1 TAPERPNT 27 (SUTURE) IMPLANT
SUT VIC AB 2-0 CTX 36 (SUTURE) ×3 IMPLANT
SUT VIC AB 2-0 UR6 27 (SUTURE) IMPLANT
SUT VIC AB 3-0 MH 27 (SUTURE) IMPLANT
SUT VIC AB 3-0 SH 18 (SUTURE) IMPLANT
SUT VIC AB 3-0 SH 27 (SUTURE)
SUT VIC AB 3-0 SH 27X BRD (SUTURE) IMPLANT
SUT VIC AB 3-0 X1 27 (SUTURE) ×6 IMPLANT
SUT VICRYL 0 UR6 27IN ABS (SUTURE) ×3 IMPLANT
SUT VICRYL 2 TP 1 (SUTURE) IMPLANT
SWAB COLLECTION DEVICE MRSA (MISCELLANEOUS) IMPLANT
SYSTEM SAHARA CHEST DRAIN ATS (WOUND CARE) ×3 IMPLANT
TAPE CLOTH 4X10 WHT NS (GAUZE/BANDAGES/DRESSINGS) ×3 IMPLANT
TAPE CLOTH SURG 4X10 WHT LF (GAUZE/BANDAGES/DRESSINGS) ×3 IMPLANT
TIP APPLICATOR SPRAY EXTEND 16 (VASCULAR PRODUCTS) IMPLANT
TOWEL OR 17X24 6PK STRL BLUE (TOWEL DISPOSABLE) ×6 IMPLANT
TOWEL OR 17X26 10 PK STRL BLUE (TOWEL DISPOSABLE) ×6 IMPLANT
TRAP SPECIMEN MUCOUS 40CC (MISCELLANEOUS) IMPLANT
TRAY FOLEY CATH 14FRSI W/METER (CATHETERS) ×3 IMPLANT
TUBE ANAEROBIC SPECIMEN COL (MISCELLANEOUS) IMPLANT
TUBE CONNECTING 12X1/4 (SUCTIONS) ×3 IMPLANT
TUNNELER SHEATH ON-Q 16GX12 DP (PAIN MANAGEMENT) IMPLANT
WATER STERILE IRR 1000ML POUR (IV SOLUTION) ×6 IMPLANT

## 2013-05-03 NOTE — Interval H&P Note (Signed)
History and Physical Interval Note:  05/03/2013 7:27 AM  [name] [name]  has presented today for surgery, with the diagnosis of left lower lobe mass  The various methods of treatment have been discussed with the patient and family. After consideration of risks, benefits and other options for treatment, the patient has consented to  Procedure(s) with comments: REDO VIDEO ASSISTED THORACOSCOPY (Left) - REDO LEFT VATS THORACOTOMY MAJOR (Left) SEGMENTECTOMY (Left) LOBECTOMY (Left) as a surgical intervention .  The patient's history has been reviewed, patient examined, no change in status, stable for surgery.  I have reviewed the patient's chart and labs.  Questions were answered to the patient's satisfaction.     [name] C

## 2013-05-03 NOTE — H&P (View-Only) (Signed)
HPI:  Mr. Wise returns for a scheduled followup visit to discuss treatment of his left lower lobe mass and lymph nodes.  He is a 57 year old gentleman with a history tobacco abuse lung cancer.  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 He was treated with chemotherapy and then had a wedge resection. He also had a wedge resection of a left lower lobe nodule at the same operation that turned out to be a benign lesion. He has been followed since then with CT scans. A CT in January showed an 8 mm nodule in the left lung. A followup CT in April showed that the lesion had grown in size to 10 mm. A PET CT was done which showed the lesion to be hypermetabolic. There also was metabolic activity noted in the hilar nodes although the nodes themselves did not appear enlarged. I saw him 3 weeks ago. At that time I discussed with them the importance of smoking cessation. He was smoking about 1.5 packs of cigarettes daily at that time. Overall he has a greater than 80-pack-year history of smoking. I recommended to him that he quit smoking and he now returns to further discuss potential treatment for the left lower lobe mass.  He says that he continued to smoke for the first week after a visit. He then started trying to cut back over the next week. He says over the past week he has only had 2 cigarettes. He does still have cravings.  Past Medical History  Diagnosis Date  . HTN (hypertension)   . Hypercholesteremia   . COPD (chronic obstructive pulmonary disease)   . Lung cancer        Current Outpatient Prescriptions  Medication Sig Dispense Refill  . albuterol (VENTOLIN HFA) 108 (90 BASE) MCG/ACT inhaler INHALE 1 TO 2 PUFFS BY MOUTH EVERY 4 HOURS AS NEEDED  18 each  0  . ALPRAZolam (XANAX) 0.25 MG tablet Take 1 tablet (0.25 mg total) by mouth at bedtime as needed for sleep.  30 tablet  0  . aspirin 81 MG tablet Take 81 mg by mouth daily.      . hydrochlorothiazide  (HYDRODIURIL) 25 MG tablet Take 25 mg by mouth daily.       No current facility-administered medications for this visit.    Physical Exam BP 157/98  Pulse 84  Resp 20  Ht 6\' 1"  (1.854 m)  Wt 256 lb (116.121 kg)  BMI 33.78 kg/m2  SpO2 19% Obese 57 year old male in no acute distress Neurologic alert and oriented x3 with no focal deficits Neck no cervical or suprapubic or adenopathy Lungs clear bilaterally Well-healed thoracotomy incision Cardiac regular rate and rhythm normal S1 and S2 Abdomen obese soft nontender 2+ pulses throughout No peripheral edema  Diagnostic Tests: Pulmonary function testing FVC 3.54 (67%) FEV1 2.05 (51%) FEV1 post bronchodilator 2.40 (60%) FEV1 to FVC ratio 58% DLCO 95%  PET/CT 03/03/13 *RADIOLOGY REPORT*  Clinical Data: Subsequent treatment strategy for lung cancer.  Restaging scan.  NUCLEAR MEDICINE PET SKULL BASE TO THIGH  Fasting Blood Glucose: 112  Technique: 19.8 mCi F-18 FDG was injected intravenously. CT data  was obtained and used for attenuation correction and anatomic  localization only. (This was not acquired as a diagnostic CT  examination.) Additional exam technical data entered on  technologist worksheet.  Comparison: Chest CT 02/22/2013. PET CT 08/22/2011.  Findings:  Neck: No hypermetabolic lymph nodes in the neck. Small amount of  mucosal  thickening in the left maxillary sinus. Small air fluid  level layering dependently in the right maxillary sinus.  Chest: Postoperative changes of prior left lower lobe wedge  resection and noted. In close proximity to the inferior aspect of  the resection margin there is a 11 mm hypermetabolic nodule (SUVmax  = 5.9). There are multiple small foci of hypermetabolism in and  around the left hilar region which are difficult to discretely  visualize on the noncontrast CT images, although one of these  adjacent to the left inferior pulmonary vein is only 9 mm in short  axis (image 104 of  series 2). These areas are all mildly  hypermetabolic (SUVmax = 4.9 - 6.6), and are concerning for  potential small malignant lymph nodes. There is also a 4 mm nodule  in the right lower lobe (image 115 of series 2) which is similar to  prior studies dating back to 2012, and presumably benign. There  are a few small areas of ground-glass attenuation in the superior  segment of the right lower lobe, as well as a right upper lobe,  which are highly nonspecific, but similar to prior examinations.  There is atherosclerosis of the thoracic aorta, the great vessels  of the mediastinum and the coronary arteries, including calcified  atherosclerotic plaque in the left main, left anterior descending,  left circumflex and right coronary arteries.  Abdomen/Pelvis: No abnormal hypermetabolic activity within the  liver, pancreas, adrenal glands, or spleen. No hypermetabolic  lymph nodes in the abdomen or pelvis. Atherosclerotic  calcifications throughout the abdominal and pelvic vasculature,  without definite aneurysm. There are a few scattered colonic  diverticula, without surrounding inflammatory changes to suggest  acute diverticulitis at this time. Normal appendix. Prostate and  urinary bladder are unremarkable in appearance.  Skeleton: No focal hypermetabolic activity to suggest skeletal  metastasis. Post thoracotomy changes in the left ninth rib  posterolaterally.  IMPRESSION:  1. 11 mm hypermetabolic left lower lobe nodule with multiple small  left hilar lymph nodes which demonstrate mild hypermetabolism.  Findings are concerning for local recurrence of disease with left  hilar nodal spread. Clinical correlation is recommended.  2. Paranasal sinus disease, as above, including a small air fluid  level the right maxillary sinus. Clinical correlation for signs  and symptoms of acute sinusitis is recommended.  3. Atherosclerosis, including left main and three-vessel coronary  artery disease.  Assessment for potential risk factor  modification, dietary therapy or pharmacologic therapy may be  warranted, if clinically indicated.  4. Colonic diverticulosis without findings to suggest acute  diverticulitis at this time.  Original Report Authenticated By: Trudie Reed, M.D.  Impression: 57 year old gentleman with a history of lung cancer who now has a new, enlarging left lower lobe mass hypermetabolic hilar and AP window nodes. This is concerning for a new lung cancer potentially stage II or 3 depending on the status of the lymph nodes. The lesion is small and peripheral and would be subject to high rate of false negative results with attempted needle biopsy or bronchoscopic biopsy. Given his young age and his desire to be aggressive, I think the best option is to proceed with wedge resection for diagnostic purposes and possible segmentectomy or lobectomy and lymph node dissection. This has potential to be a difficult operation as it is a redo. However he is young and does have adequate pulmonary function to tolerate a resection.  He did have some finding of atherosclerosis in his coronaries on CT scan. That is  a common finding on CT these days. I questioned him extensively regarding any potential cardiac symptoms. He really does not have any symptoms that can be attributed to his heart. I think the yield for further cardiac workup is likely to be extremely low and he should be able to tolerate a pulmonary resection. He and his wife do understand that he is at increased risk for cardiac complications given that finding.  I discussed with them the general nature of the procedure including the incisions to be used the need for general anesthesia. We discussed the expected hospital stay. They understand that no guarantee of cure can be given. We discussed the indications, risks, benefits, and alternatives. They understand the risk include, but are not limited to, death, MI, DVT, PE, bleeding,  possible need for transfusion, infection, prolonged air leak, cardiac arrhythmias, and other unforeseeable complications. She understands and accepts the risk and strongly wishes to proceed.  Plan: Redo left VATS, possible thoracotomy, possible segmentectomy or lobectomy on Monday, June 23.

## 2013-05-03 NOTE — Op Note (Signed)
NAMEDETRELL, UMSCHEID                 ACCOUNT NO.:  [id number]  MEDICAL RECORD NO.:  [id number]  LOCATION:  2309                         FACILITY:  [facility]  PHYSICIAN:  Salvatore Decent. Dorris Fetch, M.D.DATE OF BIRTH:  09-28-1956  DATE OF PROCEDURE:  05/03/2013 DATE OF DISCHARGE:                              OPERATIVE REPORT   PREOPERATIVE DIAGNOSIS:  Left lower lobe mass.  POSTOPERATIVE DIAGNOSIS:  Non-small-cell carcinoma of left lower lobe, stage IIA.  PROCEDURE:  Redo left video-assisted thoracoscopy, lysis of adhesions, wedge resection of left lower lobe, thoracoscopic left lower lobectomy.  SURGEON:  Salvatore Decent. Dorris Fetch, M.D.  ASSISTANT:  Doree Fudge, PA-C  ANESTHESIA:  General.  FINDINGS:  Severe adhesions, 1-cm mass in basilar segment of left lower lobe.  Frozen section positive for non-small cell carcinoma. Enlarged, highly suspicious hilar nodes along the left lower lobe bronchus. Lobectomy margins grossly negative, but frozen section of the margins were not performed, given the patient was not a candidate for any additional resection.  CLINICAL NOTE:  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  On a routine followup CT scan, he was found to have a left lower lobe nodule. A subsequent CT showed this had increased in size.  A PET/CT was done, which showed the lesion to be hypermetabolic.  There also was a suspicion of hilar adenopathy.  The patient was advised that therapeutic options including treatment with chemotherapy and/or radiation versus surgical resection.  He did understand the high risk associated with redo surgery, as well as greater difficulty and less predictable results.  He understood and accepted the risks of  surgery and wished to proceed with a surgical resection.  OPERATIVE NOTE:  [name] was brought to the preoperative holding area on May 03, 2013.  There Anesthesia established central venous access, and placed an arterial blood pressure monitoring line.  Intravenous antibiotics were administered.  He was taken to the operating room, anesthetized, and intubated with a double-lumen endotracheal tube.  SCD hose were placed for DVT prophylaxis.  Foley catheter was placed.  He was placed in a right lateral decubitus position and the left chest was prepped and draped in usual sterile fashion.  Single lung ventilation of the right lung was carried out and was tolerated well throughout the procedure.  An incision was made approximately the seventh intercostal space in the anterior axillary line. It was carried through the skin and subcutaneous tissue.  The chest was entered bluntly using a hemostat.  The port was inserted and the thoracoscope was placed in the chest.  There was good separation of the left lung with no cross ventilation.  There were significant adhesions.  A small utility incision was made in approximately the fifth intercostal space.  This was about 6 cm in length.  No rib spreading was performed during the procedure.  Adhesions then were taken down between the lung and the chest wall.  This was a long and  tedious process. There were particularly dense adhesions in the area of the previous incision.  There also were significant adhesions Posteriorly, as well as apically at the previous resection sites. Finally, after the adhesions had been taken down, a second port incision was made anterior to the first for instrumentation.  The fissure was dissected out and then the lesion could be palpated in the basilar portion of the left lower lobe.  Wedge resection was performed with sequential firings of Endo-GIA type stapler.  This was then sent for frozen section.  While awaiting  frozen section, the pleural reflection was divided at the hilum.  There were enlarged nodes along the left lower lobe bronchus, which were highly suspicious and almost certainly metastatic.  It was felt the lobectomy would be necessary to have a chance for a curative resection if the frozen section confirmed that this was a carcinoma.  Frozen section subsequently returned with a diagnosis of non-small-cell carcinoma. The decision made to proceed with surgical resection.  The pleural reflection was divided, exposing the vessels medially.  The adhesions were taken down and what was left of the inferior pulmonary ligament was divided exposing the inferior pulmonary vein.  This was encircled and divided with an endoscopic vascular stapler.  At this point, the dissection was very difficult due to both the nodes along the bronchus as well as previous adhesions.  Multiple staplers were used completing the fissure until a pulmonary arterial branch was identified.  Then, this was carefully dissected out. The basilar segmental pulmonary arteries were encircled and divided with an endoscopic vascular stapler.  This then allowed dissection of the left lower lobe bronchus which was divided with an endoscopic GIA stapler.  Next, the superior segmental arterial branch was identified, encircled, and divided, and the fissure then was completed with additional firings of an endoscopic stapler using black cartridges for the fissure due to the thickness of the tissue.  The chest was filled with warm saline.  A test inflation to 30 cm of water revealed no leakage from the bronchial stump.  There was a complete resection grossly, but as the patient was not a candidate for any additional resection at this time, margins will await final pathology.  There was really no way to safely and effectively do a mediastinal lymph node dissection.  A final inspection was made for hemostasis.  A 36-French chest tube was  placed through the posterior incision and a 28-French was placed through the anterior port incision. These were secured with #1 silk sutures.  The utility incision was closed with a running #1 Vicryl suture into the fascia followed by 2-0 Vicryl subcutaneous suture and a 3-0 Vicryl subcuticular suture.  The patient was placed back in a supine position.  He was extubated in the operating room and taken to the postanesthetic care unit in good condition.     Salvatore Decent Dorris Fetch, M.D.     SCH/MEDQ  D:  05/03/2013  T:  05/03/2013  Job:  [id number]

## 2013-05-03 NOTE — Brief Op Note (Addendum)
05/03/2013  1:55 PM  PATIENT:  [name] [name]  57 y.o. male  PRE-OPERATIVE DIAGNOSIS:  1.Left lower lobe mass  2. History of mixed NSCC and SCC LUL 12' (s/p chemotherapy and wedge resection of LUL)  POST-OPERATIVE DIAGNOSIS:  Non small cell carcinoma- Stage II   PROCEDURE:  REDO LEFT VATS, LYSIS OF ADHESIONS, LEFT MINI THORACOTOMY, WEDGE RESECTION OF LLL, THORACOSCOPIC LEFT LOWER LOBECTOMY    SURGEON:  Surgeon(s) and Role:    * Loreli Slot, MD - Primary  PHYSICIAN ASSISTANT: Doree Fudge PA-C   ANESTHESIA:   general  EBL:  Total I/O In: 2500 [I.V.:2000; IV Piggyback:500] Out: 950 [Urine:350; Blood:600]  BLOOD ADMINISTERED:none  DRAINS: 28 straigth  and 36 right angle Chest Tube(s) in the left pleural space   SPECIMEN:  Source of Specimen:  wedge LLL, completion LLL  DISPOSITION OF SPECIMEN:  PATHOLOGY  COUNTS CORRECT:  YES  PLAN OF CARE: Admit to inpatient   PATIENT DISPOSITION:  PACU - hemodynamically stable.   Delay start of Pharmacological VTE agent (>24hrs) due to surgical blood loss or risk of bleeding: yes  FINDINGS-  Severe adhesions. LLL mass + non small cell carcinoma Highly suspicious left hilar lymph nodes.  Lobectomy margins grossly negative

## 2013-05-03 NOTE — Transfer of Care (Signed)
Immediate Anesthesia Transfer of Care Note  Patient: [name]  Procedure(s) Performed: Procedure(s) with comments: REDO VIDEO ASSISTED THORACOSCOPY (Left) - REDO LEFT VATS THORACOTOMY MAJOR (Left) LOBECTOMY (Left) WEDGE RESECTION (Left)  Patient Location: PACU  Anesthesia Type:General  Level of Consciousness: awake  Airway & Oxygen Therapy: Patient Spontanous Breathing and Patient connected to face mask oxygen  Post-op Assessment: Report given to PACU RN, Post -op Vital signs reviewed and stable and Patient moving all extremities  Post vital signs: Reviewed and stable  Complications: No apparent anesthesia complications

## 2013-05-03 NOTE — Progress Notes (Signed)
Patient ID: [name] [name], male   DOB: May 11, 1956, 57 y.o.   MRN: [id number] EVENING ROUNDS NOTE :     [address]       [place] [address]             [phone]                 Day of Surgery Procedure(s) (LRB): REDO VIDEO ASSISTED THORACOSCOPY (Left) THORACOTOMY MAJOR (Left) LOBECTOMY (Left) WEDGE RESECTION (Left)  Total Length of Stay:  LOS: 0 days  BP 137/82  Pulse 85  Temp(Src) 98 F (36.7 C) (Oral)  Resp 13  SpO2 95%  .Intake/Output     06/22 0701 - 06/23 0700 06/23 0701 - 06/24 0700   P.O.  120   I.V.  2404.6   IV Piggyback  500   Total Intake   3024.6   Urine  695   Blood  600   Chest Tube  250   Total Output   1545   Net   +1479.6          . dextrose 5 % and 0.9 % NaCl with KCl 20 mEq/L 125 mL/hr at 05/03/13 1600     Lab Results  Component Value Date   WBC 8.9 04/29/2013   HGB 15.3 04/29/2013   HCT 43.8 04/29/2013   PLT 262 04/29/2013   GLUCOSE 94 04/29/2013   ALT 30 04/29/2013   AST 22 04/29/2013   NA 134* 04/29/2013   K 3.8 04/29/2013   CL 101 04/29/2013   CREATININE 0.86 04/29/2013   BUN 9 04/29/2013   CO2 22 04/29/2013   INR 0.93 04/29/2013   Extubated now, 300 from ct total Good resp  effort Delight Ovens MD  Beeper [phone] Office [phone] 05/03/2013 6:05 PM

## 2013-05-03 NOTE — Anesthesia Procedure Notes (Addendum)
Procedure Name: Intubation Date/Time: 05/03/2013 7:53 AM Performed by: Luster Landsberg Pre-anesthesia Checklist: Patient identified, Emergency Drugs available, Suction available and Patient being monitored Patient Re-evaluated:Patient Re-evaluated prior to inductionOxygen Delivery Method: Circle system utilized Preoxygenation: Pre-oxygenation with 100% oxygen Intubation Type: IV induction Ventilation: Mask ventilation without difficulty and Oral airway inserted - appropriate to patient size Laryngoscope Size: Mac and 3 Grade View: Grade II Tube type: Oral Endobronchial tube: EBT position confirmed by fiberoptic bronchoscope, Left and Double lumen EBT and 39 Fr Number of attempts: 1 Airway Equipment and Method: Stylet Placement Confirmation: ETT inserted through vocal cords under direct vision and breath sounds checked- equal and bilateral Tube secured with: Tape Dental Injury: Teeth and Oropharynx as per pre-operative assessment  Comments: Intubation by Tincher SRNA

## 2013-05-03 NOTE — Anesthesia Preprocedure Evaluation (Addendum)
Anesthesia Evaluation  Patient identified by MRN, date of birth, ID band Patient awake    Reviewed: Allergy & Precautions, H&P , NPO status , Patient's Chart, lab work & pertinent test results  Airway Mallampati: II TM Distance: >3 FB Neck ROM: Full    Dental   Pulmonary shortness of breath and with exertion, COPD COPD inhaler, Current Smoker,  Lung ca + rhonchi         Cardiovascular hypertension, Pt. on medications Rhythm:Regular Rate:Normal     Neuro/Psych    GI/Hepatic   Endo/Other    Renal/GU      Musculoskeletal   Abdominal (+) + obese,   Peds  Hematology   Anesthesia Other Findings   Reproductive/Obstetrics                          Anesthesia Physical Anesthesia Plan  ASA: III  Anesthesia Plan: General   Post-op Pain Management:    Induction: Intravenous  Airway Management Planned: Double Lumen EBT  Additional Equipment: Arterial line and CVP  Intra-op Plan:   Post-operative Plan: Extubation in OR  Informed Consent: I have reviewed the patients History and Physical, chart, labs and discussed the procedure including the risks, benefits and alternatives for the proposed anesthesia with the patient or authorized representative who has indicated his/her understanding and acceptance.     Plan Discussed with: CRNA and Surgeon  Anesthesia Plan Comments:         Anesthesia Quick Evaluation

## 2013-05-03 NOTE — Anesthesia Postprocedure Evaluation (Signed)
  Anesthesia Post-op Note  Patient: [name] [name]  Procedure(s) Performed: Procedure(s) with comments: REDO VIDEO ASSISTED THORACOSCOPY (Left) - REDO LEFT VATS THORACOTOMY MAJOR (Left) LOBECTOMY (Left) WEDGE RESECTION (Left)  Patient Location: PACU  Anesthesia Type:General  Level of Consciousness: awake  Airway and Oxygen Therapy: Patient Spontanous Breathing  Post-op Pain: mild  Post-op Assessment: Post-op Vital signs reviewed, Patient's Cardiovascular Status Stable, Respiratory Function Stable, Patent Airway, No signs of Nausea or vomiting and Pain level controlled  Post-op Vital Signs: stable  Complications: No apparent anesthesia complications

## 2013-05-04 ENCOUNTER — Inpatient Hospital Stay (HOSPITAL_COMMUNITY): Payer: BC Managed Care – PPO

## 2013-05-04 LAB — POCT I-STAT 3, ART BLOOD GAS (G3+)
Bicarbonate: 24.5 mEq/L — ABNORMAL HIGH (ref 20.0–24.0)
pCO2 arterial: 41.3 mmHg (ref 35.0–45.0)
pH, Arterial: 7.378 (ref 7.350–7.450)
pO2, Arterial: 81 mmHg (ref 80.0–100.0)

## 2013-05-04 LAB — BASIC METABOLIC PANEL
Chloride: 104 mEq/L (ref 96–112)
GFR calc Af Amer: >90 mL/min (ref >90)
GFR calc non Af Amer: >90 mL/min (ref >90)
Potassium: 4 mEq/L (ref 3.5–5.1)
Sodium: 136 mEq/L (ref 135–145)

## 2013-05-04 LAB — CBC
HCT: 38.3 % — ABNORMAL LOW (ref 39.0–52.0)
Platelets: 216 10*3/uL (ref 150–400)
RDW: 14 % (ref 11.5–15.5)
WBC: 13.9 10*3/uL — ABNORMAL HIGH (ref 4.0–10.5)

## 2013-05-04 MED ORDER — ALPRAZOLAM 0.25 MG PO TABS
0.2500 mg | ORAL_TABLET | Freq: Every evening | ORAL | Status: DC | PRN
  Administered 2013-05-04 – 2013-05-06 (×3): 0.25 mg via ORAL
  Filled 2013-05-04 (×3): qty 1

## 2013-05-04 MED ORDER — ENOXAPARIN SODIUM 40 MG/0.4ML ~~LOC~~ SOLN
40.0000 mg | SUBCUTANEOUS | Status: DC
  Administered 2013-05-04 – 2013-05-07 (×4): 40 mg via SUBCUTANEOUS
  Filled 2013-05-04 (×4): qty 0.4

## 2013-05-04 NOTE — Progress Notes (Signed)
1 Day Post-Op Procedure(s) (LRB): REDO VIDEO ASSISTED THORACOSCOPY (Left) THORACOTOMY MAJOR (Left) LOBECTOMY (Left) WEDGE RESECTION (Left) Subjective: Some incisional pain Transient nausea earlier- resolved  Objective: Vital signs in last 24 hours: Temp:  [97.4 F (36.3 C)-98.5 F (36.9 C)] 97.6 F (36.4 C) (06/24 0740) Pulse Rate:  [73-98] 98 (06/24 0700) Cardiac Rhythm:  [-] Normal sinus rhythm (06/23 2000) Resp:  [11-18] 18 (06/24 0700) BP: (101-161)/(56-103) 112/57 mmHg (06/24 0600) SpO2:  [94 %-99 %] 95 % (06/24 0700) Arterial Line BP: (69-165)/(57-90) 105/62 mmHg (06/24 0700)  Hemodynamic parameters for last 24 hours:    Intake/Output from previous day: 06/23 0701 - 06/24 0700 In: 5184.6 [P.O.:240; I.V.:4344.6; IV Piggyback:600] Out: 3500 [Urine:1800; Blood:600; Chest Tube:1100] Intake/Output this shift:    General appearance: alert and no distress Neurologic: intact Heart: regular rate and rhythm Lungs: diminished breath sounds left base Abdomen: normal findings: soft, non-tender minimal air leak, serosanguinous drainage  Lab Results:  Recent Labs  05/04/13 0435  WBC 13.9*  HGB 12.9*  HCT 38.3*  PLT 216   BMET:  Recent Labs  05/04/13 0435  NA 136  K 4.0  CL 104  CO2 24  GLUCOSE 149*  BUN 7  CREATININE 0.71  CALCIUM 7.5*    PT/INR: No results found for this basename: LABPROT, INR,  in the last 72 hours ABG    Component Value Date/Time   PHART 7.378 05/04/2013 0436   HCO3 24.5* 05/04/2013 0436   TCO2 26 05/04/2013 0436   ACIDBASEDEF 1.0 05/04/2013 0436   O2SAT 96.0 05/04/2013 0436   CBG (last 3)  No results found for this basename: GLUCAP,  in the last 72 hours  Assessment/Plan: S/P Procedure(s) (LRB): REDO VIDEO ASSISTED THORACOSCOPY (Left) THORACOTOMY MAJOR (Left) LOBECTOMY (Left) WEDGE RESECTION (Left) Plan for transfer to step-down: see transfer orders POD # 1 LLL  CV- stable- dc a line  RESP- pulmonary hygiene, nebs  Minimal  air leak but relatively high amount of serosanguinous drainage- keep CT to suction  RENAL- lytes, creatinine OK  DVT prophylaxis- SCD, add lovenox  Transfer to 3300 when bed available  LOS: 1 day    Maryellen Dowdle C 05/04/2013

## 2013-05-04 NOTE — Progress Notes (Signed)
Pt arrived from 2300, VSS, A&O, no air leak; will continue to monitor.

## 2013-05-05 ENCOUNTER — Encounter (HOSPITAL_COMMUNITY): Payer: Self-pay | Admitting: Thoracic Surgery (Cardiothoracic Vascular Surgery)

## 2013-05-05 ENCOUNTER — Inpatient Hospital Stay (HOSPITAL_COMMUNITY): Payer: BC Managed Care – PPO

## 2013-05-05 LAB — COMPREHENSIVE METABOLIC PANEL
ALT: 28 U/L (ref 0–53)
Albumin: 3 g/dL — ABNORMAL LOW (ref 3.5–5.2)
Alkaline Phosphatase: 74 U/L (ref 39–117)
BUN: 5 mg/dL — ABNORMAL LOW (ref 6–23)
Chloride: 100 mEq/L (ref 96–112)
Glucose, Bld: 103 mg/dL — ABNORMAL HIGH (ref 70–99)
Potassium: 3.6 mEq/L (ref 3.5–5.1)
Sodium: 135 mEq/L (ref 135–145)
Total Bilirubin: 0.5 mg/dL (ref 0.3–1.2)

## 2013-05-05 LAB — CBC
HCT: 39 % (ref 39.0–52.0)
Hemoglobin: 13 g/dL (ref 13.0–17.0)
RDW: 14.3 % (ref 11.5–15.5)
WBC: 14.5 10*3/uL — ABNORMAL HIGH (ref 4.0–10.5)

## 2013-05-05 MED ORDER — GUAIFENESIN ER 600 MG PO TB12
1200.0000 mg | ORAL_TABLET | Freq: Two times a day (BID) | ORAL | Status: DC
  Administered 2013-05-05 – 2013-05-07 (×5): 1200 mg via ORAL
  Filled 2013-05-05 (×6): qty 2

## 2013-05-05 MED ORDER — LISINOPRIL 20 MG PO TABS
20.0000 mg | ORAL_TABLET | Freq: Every day | ORAL | Status: DC
  Administered 2013-05-05 – 2013-05-07 (×3): 20 mg via ORAL
  Filled 2013-05-05 (×3): qty 1

## 2013-05-05 MED ORDER — LEVALBUTEROL HCL 0.63 MG/3ML IN NEBU
0.6300 mg | INHALATION_SOLUTION | Freq: Three times a day (TID) | RESPIRATORY_TRACT | Status: DC
  Administered 2013-05-05 – 2013-05-07 (×7): 0.63 mg via RESPIRATORY_TRACT
  Filled 2013-05-05 (×11): qty 3

## 2013-05-05 NOTE — Progress Notes (Addendum)
      [address]       [place] [address]             [phone]      2 Days Post-Op Procedure(s) (LRB): REDO VIDEO ASSISTED THORACOSCOPY (Left) THORACOTOMY MAJOR (Left) LOBECTOMY (Left) WEDGE RESECTION (Left)  Subjective:  Mr. Holtsclaw states he had a good night.  He states he slept pretty good.  He does have some pain which is relieved with pain medications.  He does states he is a little bit short of breath this morning.  He is ambulating  Objective: Vital signs in last 24 hours: Temp:  [97.6 F (36.4 C)-98.8 F (37.1 C)] 97.6 F (36.4 C) (06/25 0328) Pulse Rate:  [78-116] 112 (06/25 0328) Cardiac Rhythm:  [-] Sinus tachycardia (06/25 0328) Resp:  [16-20] 19 (06/25 0328) BP: (108-122)/(60-76) 116/72 mmHg (06/25 0328) SpO2:  [96 %-99 %] 99 % (06/25 0328)  Intake/Output from previous day: 06/24 0701 - 06/25 0700 In: 1578 [P.O.:640; I.V.:888; IV Piggyback:50] Out: 1730 [Urine:1150; Chest Tube:580] Intake/Output this shift: Total I/O In: -  Out: 500 [Urine:500]  General appearance: alert, cooperative and no distress Heart: regular rate and rhythm Lungs: diminished breath sounds on left Abdomen: soft, non-tender; bowel sounds normal; no masses,  no organomegaly Extremities: extremities normal, atraumatic, no cyanosis or edema Wound: clean and dry  Lab Results:  Recent Labs  05/04/13 0435 05/05/13 0345  WBC 13.9* 14.5*  HGB 12.9* 13.0  HCT 38.3* 39.0  PLT 216 236   BMET:  Recent Labs  05/04/13 0435 05/05/13 0345  NA 136 135  K 4.0 3.6  CL 104 100  CO2 24 26  GLUCOSE 149* 103*  BUN 7 5*  CREATININE 0.71 0.83  CALCIUM 7.5* 7.9*    PT/INR: No results found for this basename: LABPROT, INR,  in the last 72 hours ABG    Component Value Date/Time   PHART 7.378 05/04/2013 0436   HCO3 24.5* 05/04/2013 0436   TCO2 26 05/04/2013 0436   ACIDBASEDEF 1.0 05/04/2013 0436   O2SAT 96.0 05/04/2013 0436   CBG (last 3)  No results found for this  basename: GLUCAP,  in the last 72 hours  Assessment/Plan: S/P Procedure(s) (LRB): REDO VIDEO ASSISTED THORACOSCOPY (Left) THORACOTOMY MAJOR (Left) LOBECTOMY (Left) WEDGE RESECTION (Left)  1. Chest tube- no air leak, 580cc- leave on suction 2. Pulm- CXR with worsening this morning- will schedule nebulizer treatments, flutter valve 3. CV- remains hypertensive will increase Lisinopril to 20 mg which is home dose 4. Dispo-patient with increase dyspnea this morning, continue current care   LOS: 2 days    BARRETT, ERIN 05/05/2013  CXR shows worsened aeration in LUL- will add flutter valve No air leak- dc anterior CT, keep posterior CT until drainage down

## 2013-05-06 ENCOUNTER — Inpatient Hospital Stay (HOSPITAL_COMMUNITY): Payer: BC Managed Care – PPO

## 2013-05-06 NOTE — Progress Notes (Addendum)
      [address]       [place] [address]             [phone]    3 Days Post-Op Procedure(s) (LRB): REDO VIDEO ASSISTED THORACOSCOPY (Left) THORACOTOMY MAJOR (Left) LOBECTOMY (Left) WEDGE RESECTION (Left)  Subjective:  [name] [name] is without complaints this morning.  He is hoping to be discharged home later today or tomorrow.  He is ambulating without difficulty +Bm  Objective: Vital signs in last 24 hours: Temp:  [97.7 F (36.5 C)-98.5 F (36.9 C)] 98.4 F (36.9 C) (06/26 0753) Pulse Rate:  [76-114] 76 (06/26 0753) Cardiac Rhythm:  [-] Normal sinus rhythm (06/26 0335) Resp:  [14-28] 18 (06/26 0800) BP: (115-150)/(63-95) 131/77 mmHg (06/26 0753) SpO2:  [94 %-99 %] 95 % (06/26 0800)  Intake/Output from previous day: 06/25 0701 - 06/26 0700 In: 1493 [P.O.:160; I.V.:1333] Out: 2081 [Urine:1900; Stool:1; Chest Tube:180] Intake/Output this shift: Total I/O In: 50 [I.V.:50] Out: -   General appearance: alert, cooperative and no distress Heart: regular rate and rhythm Lungs: diminished breath sounds on left Abdomen: soft, non-tender; bowel sounds normal; no masses,  no organomegaly Wound: clean and dry  Lab Results:  Recent Labs  05/04/13 0435 05/05/13 0345  WBC 13.9* 14.5*  HGB 12.9* 13.0  HCT 38.3* 39.0  PLT 216 236   BMET:  Recent Labs  05/04/13 0435 05/05/13 0345  NA 136 135  K 4.0 3.6  CL 104 100  CO2 24 26  GLUCOSE 149* 103*  BUN 7 5*  CREATININE 0.71 0.83  CALCIUM 7.5* 7.9*    PT/INR: No results found for this basename: LABPROT, INR,  in the last 72 hours ABG    Component Value Date/Time   PHART 7.378 05/04/2013 0436   HCO3 24.5* 05/04/2013 0436   TCO2 26 05/04/2013 0436   ACIDBASEDEF 1.0 05/04/2013 0436   O2SAT 96.0 05/04/2013 0436   CBG (last 3)  No results found for this basename: GLUCAP,  in the last 72 hours  Assessment/Plan: S/P Procedure(s) (LRB): REDO VIDEO ASSISTED THORACOSCOPY (Left) THORACOTOMY MAJOR  (Left) LOBECTOMY (Left) WEDGE RESECTION (Left)  1. Chest tube- no air leak, 180 cc output yesterday- CXR stable no evidence of pneumothorax, worsening aeration unchanged 2. Pulm- off oxygen, will continue nebulizer treatments, encouraged use of flutter valve 3. CV- Hypertension improved, on Lisinopril 20 mg daily 4. Pathology pending 5. Dispo- patient doing well, continue current care, likely d/c final chest tube today   LOS: 3 days    [name], [name] 05/06/2013  Patient seen and examined. Agree with above Path still pending Will dc CT, dc PCA after CT out Home in Am if he continues to progress

## 2013-05-06 NOTE — Discharge Summary (Signed)
Physician Discharge Summary  Patient ID: [name] [name] MRN: [id number] DOB/AGE: 1956/09/03 57 y.o.  Admit date: 05/03/2013 Discharge date: 05/06/2013  Admission Diagnoses:  Patient Active Problem List   Diagnosis Date Noted  . Small-cell and non-small-cell cancer left     . HTN (hypertension)   . Hypercholesteremia    Discharge Diagnoses:   Patient Active Problem List   Diagnosis Date Noted  . Small-cell and non-small-cell cancer left     . HTN (hypertension)   . Hypercholesteremia    Discharged Condition: good  History of Present Illness:   [name] [name] is a 57 yo white male with history of lung cancer and tobacco abuse.  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  This was treated with chemotherapy and subsequent wedge resection.  At that time he also underwent wedge resection of the left lower lobe which was a benign lesion.  Since that time the patient has been routinely followed with serial CT scans.  In January of this year am 8 mm nodule was found in the left lung.  Follow up CT scan performed in April showed growth in the lesion up to 10 mm in size.  Further workup with PET CT scan showed this lesion to by hypermetabolic.  There was also evidence of hypermetabolic activity in the hilar nodes.  The patient was evaluated by Dr. Dorris Fetch approximately 3 weeks ago.  At that visit the patient educated on the importance of smoking cessation.  The patient has continued to smoke at least 1.5 pack per day.  It was felt patient would be a surgical candidate however it would not make sense to continue treatment if he continues to smoke.  Also at that time he was recovering from Bronchitis.  It was felt the patient should undergo PFTs, attempt to quit smoking, and follow up in 3 weeks once his Bronchitis resolved.  The patient was again evaluated by Dr. Dorris Fetch on 04/20/2013.  At that visit the patient had almost stopped  smoking.  His PFTs were acceptable to proceed with surgery.  The risks and benefits of the procedure were explained to the patient and he was agreeable to proceed.    Hospital Course:   [name] [name] presented to [facility] on May 03, 2013.  He was taken to the operating room and underwent Redo Left Video Assisted Thoracoscopy, Mini Thoracotomy, Lysis of Adhesions, Wedge Resection of Left Lower Lobe, and Thorascopic Left Lower Lobectomy.  The patient tolerated the procedure well, was extubated and taken to the PACU in stable condition.  The patient has done well post operatively.  His chest tubes have not exhibited any evidence of air leak.  His chest tubes were removed as chest tube output decreased.  Chest xray has not shown evidence of pneumothorax.  However, it does show worsening aeration in the LUL.  The patient has been placed on Xopenex Nebulizer treatments and encouraged aggressive use of flutter valve.  He is asymptomatic and does not require oxygen.  Should no further issues arise we anticipate discharge in the next 24-48 hours.  Final pathology revealed Metastatic Adenocarcinoma, report is posted below.   He will need to follow up with Dr. Dorris Fetch in 2 weeks with a CXR prior to his appointment.       Significant Diagnostic Studies:   CT Scan:  Postsurgical change in the left lung with continued progression of  a left lower lobe  pulmonary nodule, now measuring 10 mm in  diameter. Given the continued progression over multiple exams ( 4  mm on 07/24/2012 to 8 mm on 11/23/2012 and now to 10 mm on  02/22/2013), disease recurrence is a distinct concern. PET CT may  prove helpful to further evaluate.  PET Scan:   1. 11 mm hypermetabolic left lower lobe nodule with multiple small  left hilar lymph nodes which demonstrate mild hypermetabolism.  Findings are concerning for local recurrence of disease with left  hilar nodal spread. Clinical correlation is recommended.  2. Paranasal  sinus disease, as above, including a small air fluid  level the right maxillary sinus. Clinical correlation for signs  and symptoms of acute sinusitis is recommended.  3. Atherosclerosis, including left main and three-vessel coronary  artery disease. Assessment for potential risk factor  modification, dietary therapy or pharmacologic therapy may be  warranted, if clinically indicated.  4. Colonic diverticulosis without findings to suggest acute  diverticulitis at this time.   Pathology:   1. Lung, wedge biopsy/resection - ADENOCARCINOMA, 1.3 CM. 2. Lymph node, biopsy, 10 node - METASTATIC ADENOCARCINOMA. 3. Lung, resection (segmental or lobe), Left lower lobe - ADENOCARCINOMA, 1.1 CM. - MARGINS NOT INVOLVED. - PERIBRONCHIAL LYMPH NODE WITH METASTATIC CARCINOMA.  Treatments: surgery:   Redo left video-assisted thoracoscopy, Mini Thoracotomy, lysis of adhesions,  wedge resection of left lower lobe, thoracoscopic left lower lobectomy.   Disposition: 01-Home or Self Care   Discharge Medications:     Medication List    TAKE these medications       albuterol 108 (90 BASE) MCG/ACT inhaler  Commonly known as:  PROVENTIL HFA;VENTOLIN HFA  Inhale 1-2 puffs into the lungs every 4 (four) hours as needed for wheezing.     ALPRAZolam 0.25 MG tablet  Commonly known as:  XANAX  Take 1 tablet (0.25 mg total) by mouth at bedtime as needed for sleep.     aspirin 81 MG tablet  Take 81 mg by mouth daily.     guaiFENesin 600 MG 12 hr tablet  Commonly known as:  MUCINEX  Take 2 tablets (1,200 mg total) by mouth 2 (two) times daily.     hydrochlorothiazide 25 MG tablet  Commonly known as:  HYDRODIURIL  Take 25 mg by mouth daily.     lisinopril 20 MG tablet  Commonly known as:  PRINIVIL,ZESTRIL  Take 20 mg by mouth daily.     oxyCODONE-acetaminophen 5-325 MG per tablet  Commonly known as:  PERCOCET/ROXICET  Take 1-2 tablets by mouth every 4 (four) hours as needed.      pravastatin 40 MG tablet  Commonly known as:  PRAVACHOL  Take 40 mg by mouth daily.     traMADol 50 MG tablet  Commonly known as:  ULTRAM  Take 1-2 tablets (50-100 mg total) by mouth every 6 (six) hours as needed.            Follow-up Information   Follow up with Loreli Slot, MD.   Contact information:   [address] [place] [address] [phone]       Follow up with [facility].   Contact information:   [place]       Signed: Konstance Happel 05/06/2013, 8:48 AM

## 2013-05-07 ENCOUNTER — Inpatient Hospital Stay (HOSPITAL_COMMUNITY): Payer: BC Managed Care – PPO

## 2013-05-07 MED ORDER — OXYCODONE-ACETAMINOPHEN 5-325 MG PO TABS: 1.0000 | ORAL_TABLET | ORAL | Status: DC | PRN

## 2013-05-07 MED ORDER — GUAIFENESIN ER 600 MG PO TB12: 1200.0000 mg | ORAL_TABLET | Freq: Two times a day (BID) | ORAL | Status: DC

## 2013-05-07 MED ORDER — LUNG SURGERY BOOK
Freq: Once | Status: DC
  Filled 2013-05-07: qty 1

## 2013-05-07 MED ORDER — TRAMADOL HCL 50 MG PO TABS: 50.0000 mg | ORAL_TABLET | Freq: Four times a day (QID) | ORAL | Status: DC | PRN

## 2013-05-07 NOTE — Progress Notes (Signed)
4 Days Post-Op Procedure(s) (LRB): REDO VIDEO ASSISTED THORACOSCOPY (Left) THORACOTOMY MAJOR (Left) LOBECTOMY (Left) WEDGE RESECTION (Left) Subjective: No complaints, wants to go home  Objective: Vital signs in last 24 hours: Temp:  [98.4 F (36.9 C)-98.8 F (37.1 C)] 98.8 F (37.1 C) (06/27 0455) Pulse Rate:  [78-105] 105 (06/27 0455) Cardiac Rhythm:  [-] Normal sinus rhythm (06/27 0455) Resp:  [14-18] 18 (06/27 0455) BP: (120-137)/(71-87) 137/85 mmHg (06/27 0455) SpO2:  [97 %-100 %] 98 % (06/27 0455)  Hemodynamic parameters for last 24 hours:    Intake/Output from previous day: 06/26 0701 - 06/27 0700 In: 2110 [P.O.:960; I.V.:1150] Out: 1250 [Urine:1250] Intake/Output this shift:    General appearance: alert and no distress Neurologic: intact Heart: regular rate and rhythm Lungs: diminished breath sounds left side Wound: clean and dry  Lab Results:  Recent Labs  05/05/13 0345  WBC 14.5*  HGB 13.0  HCT 39.0  PLT 236   BMET:  Recent Labs  05/05/13 0345  NA 135  K 3.6  CL 100  CO2 26  GLUCOSE 103*  BUN 5*  CREATININE 0.83  CALCIUM 7.9*    PT/INR: No results found for this basename: LABPROT, INR,  in the last 72 hours ABG    Component Value Date/Time   PHART 7.378 05/04/2013 0436   HCO3 24.5* 05/04/2013 0436   TCO2 26 05/04/2013 0436   ACIDBASEDEF 1.0 05/04/2013 0436   O2SAT 96.0 05/04/2013 0436   CBG (last 3)  No results found for this basename: GLUCAP,  in the last 72 hours  Assessment/Plan: S/P Procedure(s) (LRB): REDO VIDEO ASSISTED THORACOSCOPY (Left) THORACOTOMY MAJOR (Left) LOBECTOMY (Left) WEDGE RESECTION (Left) Plan for discharge: see discharge orders  Path- 2 separate foci of adenocarcinoma 1.3 cm known and 1.1 cm not known preop  2 N1 nodes +  Will need chemotherapy  Pain well controlled  Will dc home today   LOS: 4 days    Ladiamond Gallina C 05/07/2013

## 2013-05-07 NOTE — Progress Notes (Signed)
Patient has been discharged home earlier today. Central line was D/C without any problems. Reviewed all discharge instructions with the patient as well as is wife, all questions and concerns were address.

## 2013-05-07 NOTE — Discharge Summary (Signed)
Path- 2 separate foci of adenocarcinoma- 1.3 cm and 1.1 cm  2 N1 nodes positive Will need chemo

## 2013-05-17 ENCOUNTER — Other Ambulatory Visit: Payer: Self-pay

## 2013-05-17 DIAGNOSIS — G8918 Other acute postprocedural pain: Secondary | ICD-10-CM

## 2013-05-17 MED ORDER — HYDROCODONE-ACETAMINOPHEN 10-325 MG PO TABS: 1.0000 | ORAL_TABLET | Freq: Four times a day (QID) | ORAL | Status: DC | PRN

## 2013-05-17 NOTE — Telephone Encounter (Signed)
RX for Norco 10/325 mg called to [facility].

## 2013-05-20 ENCOUNTER — Other Ambulatory Visit: Payer: Self-pay | Admitting: *Deleted

## 2013-05-20 DIAGNOSIS — C3490 Malignant neoplasm of unspecified part of unspecified bronchus or lung: Secondary | ICD-10-CM

## 2013-05-25 ENCOUNTER — Ambulatory Visit (INDEPENDENT_AMBULATORY_CARE_PROVIDER_SITE_OTHER): Payer: Self-pay | Admitting: Thoracic Surgery (Cardiothoracic Vascular Surgery)

## 2013-05-25 ENCOUNTER — Encounter: Payer: Self-pay | Admitting: Thoracic Surgery (Cardiothoracic Vascular Surgery)

## 2013-05-25 ENCOUNTER — Ambulatory Visit
Admission: RE | Admit: 2013-05-25 | Discharge: 2013-05-25 | Disposition: A | Discharge status: Home / Self Care | Payer: BC Managed Care – PPO | Source: Ambulatory Visit | Attending: Thoracic Surgery (Cardiothoracic Vascular Surgery) | Admitting: Thoracic Surgery (Cardiothoracic Vascular Surgery)

## 2013-05-25 ENCOUNTER — Other Ambulatory Visit: Payer: Self-pay | Admitting: *Deleted

## 2013-05-25 DIAGNOSIS — C3492 Malignant neoplasm of unspecified part of left bronchus or lung: Secondary | ICD-10-CM

## 2013-05-25 DIAGNOSIS — C3490 Malignant neoplasm of unspecified part of unspecified bronchus or lung: Secondary | ICD-10-CM

## 2013-05-25 DIAGNOSIS — G8918 Other acute postprocedural pain: Secondary | ICD-10-CM

## 2013-05-25 DIAGNOSIS — Z902 Acquired absence of lung [part of]: Secondary | ICD-10-CM

## 2013-05-25 DIAGNOSIS — Z9889 Other specified postprocedural states: Secondary | ICD-10-CM

## 2013-05-25 MED ORDER — HYDROCODONE-ACETAMINOPHEN 10-325 MG PO TABS: 1.0000 | ORAL_TABLET | Freq: Four times a day (QID) | ORAL | Status: DC | PRN

## 2013-05-25 NOTE — Patient Instructions (Signed)
You may drive on a limited basis as discussed  You should not drive while taking hydrocodone  Do not lift anything over 10 pounds for another 3 weeks

## 2013-05-25 NOTE — Progress Notes (Signed)
HPI:  [name] [name] returns today for a scheduled postoperative followup visit. 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 He was treated with chemotherapy and then wedge resection for that several years ago. He recently presented with a new left lower lobe lung nodule. We did a left lower lobectomy for what turned out to be a stage II a non-small cell lung cancer on June 23.  His postoperative course was unremarkable. Since discharge she's been doing well. He is still having a fair amount of pain. His pain control is much better with hydrocodone that had been with Ultram. He has not had any problems with shortness of breath or swelling in his legs. Overall he is happy with his progress.  Past Medical History  Diagnosis Date  . Hypercholesteremia     takes Pravastatin nightly  . Lung cancer   . HTN (hypertension)     takes Lisinopril and HCTZ daily  . Shortness of breath     with exertion;Albuterol inhaler prn  . Insomnia     takes Xanax prn      Current Outpatient Prescriptions  Medication Sig Dispense Refill  . albuterol (PROVENTIL HFA;VENTOLIN HFA) 108 (90 BASE) MCG/ACT inhaler Inhale 1-2 puffs into the lungs every 4 (four) hours as needed for wheezing.      Marland Kitchen ALPRAZolam (XANAX) 0.25 MG tablet Take 1 tablet (0.25 mg total) by mouth at bedtime as needed for sleep.  30 tablet  0  . HYDROcodone-acetaminophen (NORCO) 10-325 MG per tablet Take 1 tablet by mouth every 6 (six) hours as needed for pain.  40 tablet  0  . pravastatin (PRAVACHOL) 40 MG tablet Take 40 mg by mouth daily.      Marland Kitchen aspirin 81 MG tablet Take 81 mg by mouth daily.      Marland Kitchen lisinopril-hydrochlorothiazide (PRINZIDE,ZESTORETIC) 20-25 MG per tablet        No current facility-administered medications for this visit.    Physical Exam BP 127/84  Pulse 95  Resp 18  Ht 6\' 1"  (1.854 m)  Wt 242 lb (109.77 kg)  BMI 31.93 kg/m2  SpO21 94% 57 year old male  in no acute distress Cardiac regular rate and rhythm normal S1 and S2 Lungs diminished breath sounds left base otherwise clear Incisions some erythema from chest tube sutures, otherwise healing well No peripheral edema  Diagnostic Tests: Chest x-ray pending  Impression: Mr. is a 57 year old gentleman who underwent a redo thoracotomy for a left lower lobectomy for a new stage IIa non-small cell carcinoma. He does have a previous history of a mixed small cell/non-small cell tumor of the left upper lobe previously treated with chemotherapy and wedge resection.  From a surgical standpoint he's doing well at this point in time. He still having some pain which is to be expected given he had a full thoracotomy. That is being managed with hydrocodone. He's not had any difficulty with his breathing. His appetite has improved. Overall he feels well.  He did have a stage II disease and will need adjuvant chemotherapy. We will arrange an appointment with Dr. Arbutus Ped who treated him previously.  He was cautioned not to lift anything over 10 pounds for another 3 weeks. He may begin driving a limited basis but should not drive while taking hydrocodone.   Plan: Referral to Dr. Arbutus Ped  I will see [name] [name] back in 2 months with a PA and lateral chest x-ray to  check on his progress

## 2013-05-26 ENCOUNTER — Telehealth: Payer: Self-pay | Admitting: Internal Medicine

## 2013-05-26 ENCOUNTER — Other Ambulatory Visit: Payer: Self-pay | Admitting: *Deleted

## 2013-05-26 NOTE — Progress Notes (Signed)
Per Dr Donnald Garre, pt needs f/u in 2 weeks with lab work.  SLJ

## 2013-05-26 NOTE — Telephone Encounter (Signed)
s.w. pt and advised on 8.4.14 appt...pt ok and aware

## 2013-06-03 ENCOUNTER — Other Ambulatory Visit: Payer: Self-pay | Admitting: *Deleted

## 2013-06-03 ENCOUNTER — Encounter: Payer: Self-pay | Admitting: Thoracic Surgery (Cardiothoracic Vascular Surgery)

## 2013-06-03 ENCOUNTER — Other Ambulatory Visit: Payer: Self-pay | Admitting: Thoracic Surgery (Cardiothoracic Vascular Surgery)

## 2013-06-03 ENCOUNTER — Encounter: Payer: Self-pay | Admitting: *Deleted

## 2013-06-03 DIAGNOSIS — G8918 Other acute postprocedural pain: Secondary | ICD-10-CM

## 2013-06-03 MED ORDER — HYDROCODONE-ACETAMINOPHEN 10-325 MG PO TABS: 1.0000 | ORAL_TABLET | Freq: Four times a day (QID) | ORAL | Status: DC | PRN

## 2013-06-09 ENCOUNTER — Encounter: Payer: Self-pay | Admitting: Thoracic Surgery (Cardiothoracic Vascular Surgery)

## 2013-06-11 ENCOUNTER — Other Ambulatory Visit: Payer: Self-pay | Admitting: *Deleted

## 2013-06-11 DIAGNOSIS — G8918 Other acute postprocedural pain: Secondary | ICD-10-CM

## 2013-06-11 MED ORDER — HYDROCODONE-ACETAMINOPHEN 10-325 MG PO TABS: 1.0000 | ORAL_TABLET | Freq: Four times a day (QID) | ORAL | Status: DC | PRN

## 2013-06-14 ENCOUNTER — Ambulatory Visit (HOSPITAL_BASED_OUTPATIENT_CLINIC_OR_DEPARTMENT_OTHER): Payer: BC Managed Care – PPO | Admitting: Internal Medicine

## 2013-06-14 ENCOUNTER — Encounter: Payer: Self-pay | Admitting: Internal Medicine

## 2013-06-14 ENCOUNTER — Other Ambulatory Visit (HOSPITAL_BASED_OUTPATIENT_CLINIC_OR_DEPARTMENT_OTHER): Payer: BC Managed Care – PPO | Admitting: Lab

## 2013-06-14 VITALS — BP 144/71 | HR 79 | Temp 97.7°F | Resp 18 | Ht 73.0 in | Wt 244.2 lb

## 2013-06-14 DIAGNOSIS — C771 Secondary and unspecified malignant neoplasm of intrathoracic lymph nodes: Secondary | ICD-10-CM

## 2013-06-14 DIAGNOSIS — C3492 Malignant neoplasm of unspecified part of left bronchus or lung: Secondary | ICD-10-CM

## 2013-06-14 DIAGNOSIS — C3430 Malignant neoplasm of lower lobe, unspecified bronchus or lung: Secondary | ICD-10-CM

## 2013-06-14 LAB — COMPREHENSIVE METABOLIC PANEL (CC13)
ALT: 40 U/L (ref 0–55)
AST: 27 U/L (ref 5–34)
Alkaline Phosphatase: 91 U/L (ref 40–150)
CO2: 25 mEq/L (ref 22–29)
Creatinine: 0.9 mg/dL (ref 0.7–1.3)
Total Bilirubin: 0.23 mg/dL (ref 0.20–1.20)

## 2013-06-14 LAB — CBC WITH DIFFERENTIAL/PLATELET
BASO%: 0.3 % (ref 0.0–2.0)
EOS%: 5.6 % (ref 0.0–7.0)
HCT: 38.8 % (ref 38.4–49.9)
LYMPH%: 33 % (ref 14.0–49.0)
MCH: 26.9 pg — ABNORMAL LOW (ref 27.2–33.4)
MCHC: 33.4 g/dL (ref 32.0–36.0)
MCV: 80.7 fL (ref 79.3–98.0)
MONO%: 10.4 % (ref 0.0–14.0)
NEUT%: 50.7 % (ref 39.0–75.0)
Platelets: 337 10*3/uL (ref 140–400)
RBC: 4.81 10*6/uL (ref 4.20–5.82)

## 2013-06-14 MED ORDER — PROCHLORPERAZINE MALEATE 10 MG PO TABS: 10.0000 mg | ORAL_TABLET | Freq: Four times a day (QID) | ORAL | Status: DC | PRN

## 2013-06-14 MED ORDER — DEXAMETHASONE 4 MG PO TABS: ORAL_TABLET | ORAL | Status: DC

## 2013-06-14 MED ORDER — CYANOCOBALAMIN 1000 MCG/ML IJ SOLN
1000.0000 ug | Freq: Once | INTRAMUSCULAR | Status: AC
  Administered 2013-06-14: 1000 ug via INTRAMUSCULAR

## 2013-06-14 MED ORDER — FOLIC ACID 1 MG PO TABS: 1.0000 mg | ORAL_TABLET | Freq: Every day | ORAL | Status: DC

## 2013-06-14 NOTE — Patient Instructions (Signed)
CHEMOTHERAPY INTENT: Adjuvant/Curative  CURRENT # OF CHEMOTHERAPY CYCLES: 0  CURRENT ANTIEMETICS: N/A  CURRENT SMOKING STATUS: Quit smoking 05/02/13  ORAL CHEMOTHERAPY AND CONSENT: None  CURRENT BISPHOSPHONATES USE: None  PAIN MANAGEMENT: 2/10  NARCOTICS INDUCED CONSTIPATION: None  LIVING WILL AND CODE STATUS: Full Code

## 2013-06-14 NOTE — Progress Notes (Signed)
[facility] [phone]   [phone]  OFFICE PROGRESS NOTE  [name] Ravel, MD Dr. Sharman Crate [place] [address] [place] [address]  DIAGNOSIS:  1) Stage IB (T2a N0 M0) poorly differentiated carcinoma with some features of small cell carcinoma diagnosed in July 2012.  2) stage IIB (T3, N1, M0) non-small cell lung cancer, adenocarcinoma diagnosed in June of 2014.   PRIOR THERAPY:  1) Status post 3 cycles of systemic chemotherapy with carboplatin and etoposide. Last dose was given on 07/16/2011.  2) Left video-assisted thoracoscopy, mini thoracotomy; wedge, posterior segment of left upper lobe; wedge, anterior segment of left lower lobe with node dissection.  3) Redo left video-assisted thoracoscopy, lysis of adhesions, wedge resection of left lower lobe, thoracoscopic left lower lobectomy under the care of Dr. Dorris Fetch on 05/03/2013.   CURRENT THERAPY: None.  CHEMOTHERAPY INTENT: Adjuvant/Curative  CURRENT # OF CHEMOTHERAPY CYCLES: 0  CURRENT ANTIEMETICS: N/A  CURRENT SMOKING STATUS: Quit smoking 05/02/13  ORAL CHEMOTHERAPY AND CONSENT: None  CURRENT BISPHOSPHONATES USE: None  PAIN MANAGEMENT: 2/10  NARCOTICS INDUCED CONSTIPATION: None  LIVING WILL AND CODE STATUS: Full Code   INTERVAL HISTORY: TRASON [name] 57 y.o. male returns to the clinic today for follow up visit accompanied by his wife. The patient is feeling fine today with no specific complaints except for soreness in the left side of his chest. He is recovering fairly well from his recent surgery. He underwent redo left VATS with wedge resection of the left lower lobe lesion and lymph node dissection under the care of Dr. Dorris Fetch on 05/03/2013. The final pathology showed 2 foci of adenocarcinoma the first one measured 1.3 CM and the other one measured 1.1in addition to metastatic adenocarcinoma to L10 lymph node. The patient denied having any other significant  complaints today except for mild fatigue. He denied having any cough or hemoptysis but continues to have shortness breath with exertion. The patient denied  Having any fever or chills. He has no nausea or vomiting. He is here today for evaluation and discussion of his treatment options  MEDICAL HISTORY: Past Medical History  Diagnosis Date  . Hypercholesteremia     takes Pravastatin nightly  . Lung cancer   . HTN (hypertension)     takes Lisinopril and HCTZ daily  . Shortness of breath     with exertion;Albuterol inhaler prn  . Insomnia     takes Xanax prn    ALLERGIES:  has No Known Allergies.  MEDICATIONS:  Current Outpatient Prescriptions  Medication Sig Dispense Refill  . albuterol (PROVENTIL HFA;VENTOLIN HFA) 108 (90 BASE) MCG/ACT inhaler Inhale 1-2 puffs into the lungs every 4 (four) hours as needed for wheezing.      Marland Kitchen ALPRAZolam (XANAX) 0.25 MG tablet Take 1 tablet (0.25 mg total) by mouth at bedtime as needed for sleep.  30 tablet  0  . aspirin 81 MG tablet Take 81 mg by mouth daily.      Marland Kitchen HYDROcodone-acetaminophen (NORCO) 10-325 MG per tablet Take 1 tablet by mouth every 6 (six) hours as needed for pain.  40 tablet  0  . lisinopril-hydrochlorothiazide (PRINZIDE,ZESTORETIC) 20-25 MG per tablet       . pravastatin (PRAVACHOL) 40 MG tablet Take 40 mg by mouth daily.       No current facility-administered medications for this visit.    SURGICAL HISTORY:  Past Surgical History  Procedure Laterality Date  . Left video-assisted thoracoscopy, mini thoracotomy; wedge,  posterior segment of left upper lobe; wedge, anterior segment of left lower lobe with node dissection.  09/02/11    [facility]  . Video assisted thoracoscopy Left 05/03/2013    Procedure: REDO VIDEO ASSISTED THORACOSCOPY;  Surgeon: Loreli Slot, MD;  Location: [facility] OR;  Service: Thoracic;  Laterality: Left;  REDO LEFT VATS  . Thoracotomy Left 05/03/2013    Procedure: THORACOTOMY MAJOR;  Surgeon: Loreli Slot, MD;  Location: [facility] OR;  Service: Thoracic;  Laterality: Left;  . Lobectomy Left 05/03/2013    Procedure: LOBECTOMY;  Surgeon: Loreli Slot, MD;  Location: [facility] OR;  Service: Thoracic;  Laterality: Left;  . Wedge resection Left 05/03/2013    Procedure: WEDGE RESECTION;  Surgeon: Loreli Slot, MD;  Location: [facility] OR;  Service: Thoracic;  Laterality: Left;    REVIEW OF SYSTEMS:  A comprehensive review of systems was negative except for: Respiratory: positive for dyspnea on exertion and pleurisy/chest pain   PHYSICAL EXAMINATION: General appearance: alert, cooperative, fatigued and no distress Head: Normocephalic, without obvious abnormality, atraumatic Neck: no adenopathy Lymph nodes: Cervical, supraclavicular, and axillary nodes normal. Resp: clear to auscultation bilaterally Cardio: regular rate and rhythm, S1, S2 normal, no murmur, click, rub or gallop GI: soft, non-tender; bowel sounds normal; no masses,  no organomegaly Extremities: extremities normal, atraumatic, no cyanosis or edema Neurologic: Alert and oriented X 3, normal strength and tone. Normal symmetric reflexes. Normal coordination and gait  ECOG PERFORMANCE STATUS: 1 - Symptomatic but completely ambulatory  Blood pressure 144/71, pulse 79, temperature 97.7 F (36.5 C), temperature source Oral, resp. rate 18, height 6\' 1"  (1.854 m), weight 244 lb 3.2 oz (110.768 kg).  LABORATORY DATA: Lab Results  Component Value Date   WBC 8.4 06/14/2013   HGB 12.9* 06/14/2013   HCT 38.8 06/14/2013   MCV 80.7 06/14/2013   PLT 337 06/14/2013      Chemistry      Component Value Date/Time   NA 135 05/05/2013 0345   NA 139 03/03/2013 1002   NA 138 03/25/2012 1225   K 3.6 05/05/2013 0345   K 4.7 03/03/2013 1002   K 4.2 03/25/2012 1225   CL 100 05/05/2013 0345   CL 105 03/03/2013 1002   CL 98 03/25/2012 1225   CO2 26 05/05/2013 0345   CO2 25 03/03/2013 1002   CO2 26 03/25/2012 1225   BUN 5* 05/05/2013 0345   BUN 12.0 03/03/2013  1002   BUN 11 03/25/2012 1225   CREATININE 0.83 05/05/2013 0345   CREATININE 1.0 03/03/2013 1002   CREATININE 1.0 03/25/2012 1225      Component Value Date/Time   CALCIUM 7.9* 05/05/2013 0345   CALCIUM 9.3 03/03/2013 1002   CALCIUM 8.6 03/25/2012 1225   ALKPHOS 74 05/05/2013 0345   ALKPHOS 85 03/03/2013 1002   ALKPHOS 84 03/25/2012 1225   AST 37 05/05/2013 0345   AST 23 03/03/2013 1002   AST 33 03/25/2012 1225   ALT 28 05/05/2013 0345   ALT 34 03/03/2013 1002   ALT 43 03/25/2012 1225   BILITOT 0.5 05/05/2013 0345   BILITOT 0.36 03/03/2013 1002   BILITOT 0.80 03/25/2012 1225       RADIOGRAPHIC STUDIES: Dg Chest 2 View  05/25/2013   *RADIOLOGY REPORT*  Clinical Data: Left lower lung mass.  CHEST - 2 VIEW  Comparison: Chest x-ray 05/07/2013.  Findings: Postoperative changes of wedge resection in the left upper lobe are again noted.  Extensive architectural distortion and chronic pleural thickening  in the left hemithorax.  Overall, there is improved aeration compared to the prior study throughout the left lung.  Persistent elevation of the left hemidiaphragm and chronic left pleural effusion.  Right lung is clear.  Heart size is within normal limits.  Mild leftward shift of cardiomediastinal structures, similar to the prior study.  Previously noted right- sided subclavian catheter has been removed.  Atherosclerosis in the thoracic aorta.  IMPRESSION: Evolving postoperative changes of the left hemithorax, as above, with improved aeration throughout the left lung compared to the prior study.   Original Report Authenticated By: Trudie Reed, M.D.    ASSESSMENT AND PLAN: this is a very pleasant 57 years old white male with recurrent non-small cell lung cancer recently diagnosed with stage IIB non-small cell lung cancer, adenocarcinoma. Status post wedge resection of the left lower lobe. I have a lengthy discussion with the patient and his wife today about his current disease status and treatment options. I  recommended for the patient adjuvant chemotherapy with cisplatin-based regimen. I recommended for him regimen consisting of cisplatin 75 mg/M2 and Alimta 500 mg/M2 every 3 weeks for a total of 4 cycles. I discussed with the patient adverse effect of the chemotherapy including but not limited to alopecia, myelosuppression, nausea and vomiting, peripheral neuropathy, liver or renal dysfunction as well as hearing deficit. I expect the patient to start the first cycle of this treatment in 2 weeks. He will receive vitamin B12 injection today. I will call his pharmacy with prescription for Compazine 10 mg by mouth every 6 hours as needed for nausea, folic acid 1 mg by mouth daily in addition to Decadron 4 mg by mouth twice a day the day before, day of and day after the chemotherapy. He would come back for follow up visit with the start of the first cycle of his chemotherapy. The patient was advised to call immediately if he has any concerning symptoms in the interval.  The patient voices understanding of current disease status and treatment options and is in agreement with the current care plan.  All questions were answered. The patient knows to call the clinic with any problems, questions or concerns. We can certainly see the patient much sooner if necessary.  I spent 20 minutes counseling the patient face to face. The total time spent in the appointment was 30 minutes.

## 2013-06-15 ENCOUNTER — Encounter: Payer: Self-pay | Admitting: Internal Medicine

## 2013-06-15 ENCOUNTER — Other Ambulatory Visit: Payer: Self-pay | Admitting: Internal Medicine

## 2013-06-17 ENCOUNTER — Telehealth: Payer: Self-pay | Admitting: Internal Medicine

## 2013-06-17 NOTE — Telephone Encounter (Signed)
s.w pt wife and advised on 8.18.14 appt...ok and aware

## 2013-06-18 ENCOUNTER — Telehealth: Payer: Self-pay | Admitting: *Deleted

## 2013-06-18 NOTE — Telephone Encounter (Signed)
FISH study/ surgical path given to Dr Donnald Garre to review.   ALK not detected.  SLJ

## 2013-06-19 ENCOUNTER — Encounter: Payer: Self-pay | Admitting: Thoracic Surgery (Cardiothoracic Vascular Surgery)

## 2013-06-21 ENCOUNTER — Other Ambulatory Visit: Payer: Self-pay | Admitting: Thoracic Surgery (Cardiothoracic Vascular Surgery)

## 2013-06-21 ENCOUNTER — Encounter: Payer: Self-pay | Admitting: Medical Oncology

## 2013-06-21 ENCOUNTER — Other Ambulatory Visit (INDEPENDENT_AMBULATORY_CARE_PROVIDER_SITE_OTHER): Payer: Self-pay

## 2013-06-21 ENCOUNTER — Other Ambulatory Visit: Payer: Self-pay | Admitting: Internal Medicine

## 2013-06-21 DIAGNOSIS — G8918 Other acute postprocedural pain: Secondary | ICD-10-CM

## 2013-06-21 MED ORDER — ALPRAZOLAM 0.25 MG PO TABS: 0.2500 mg | ORAL_TABLET | Freq: Every evening | ORAL | Status: DC | PRN

## 2013-06-21 MED ORDER — HYDROCODONE-ACETAMINOPHEN 10-325 MG PO TABS: 1.0000 | ORAL_TABLET | Freq: Four times a day (QID) | ORAL | Status: DC | PRN

## 2013-06-21 NOTE — Progress Notes (Signed)
Left lower lobe lung tissue EGFR , TKI assay alteration not detected.

## 2013-06-21 NOTE — Telephone Encounter (Signed)
RX Refill called to pharm. Norco 10/325 mg

## 2013-06-23 ENCOUNTER — Telehealth: Payer: Self-pay | Admitting: *Deleted

## 2013-06-23 NOTE — Telephone Encounter (Signed)
Per staff message and POF I have scheduled appts.  JMW  

## 2013-06-28 ENCOUNTER — Ambulatory Visit (HOSPITAL_BASED_OUTPATIENT_CLINIC_OR_DEPARTMENT_OTHER): Payer: BC Managed Care – PPO

## 2013-06-28 ENCOUNTER — Ambulatory Visit: Payer: BC Managed Care – PPO | Admitting: Internal Medicine

## 2013-06-28 ENCOUNTER — Encounter: Payer: Self-pay | Admitting: Internal Medicine

## 2013-06-28 ENCOUNTER — Telehealth: Payer: Self-pay | Admitting: Internal Medicine

## 2013-06-28 ENCOUNTER — Other Ambulatory Visit: Payer: BC Managed Care – PPO | Admitting: Lab

## 2013-06-28 ENCOUNTER — Ambulatory Visit (HOSPITAL_BASED_OUTPATIENT_CLINIC_OR_DEPARTMENT_OTHER): Payer: BC Managed Care – PPO | Admitting: Internal Medicine

## 2013-06-28 ENCOUNTER — Other Ambulatory Visit (HOSPITAL_BASED_OUTPATIENT_CLINIC_OR_DEPARTMENT_OTHER): Payer: BC Managed Care – PPO | Admitting: Lab

## 2013-06-28 DIAGNOSIS — C3430 Malignant neoplasm of lower lobe, unspecified bronchus or lung: Secondary | ICD-10-CM

## 2013-06-28 DIAGNOSIS — Z5111 Encounter for antineoplastic chemotherapy: Secondary | ICD-10-CM

## 2013-06-28 DIAGNOSIS — R079 Chest pain, unspecified: Secondary | ICD-10-CM

## 2013-06-28 DIAGNOSIS — C3492 Malignant neoplasm of unspecified part of left bronchus or lung: Secondary | ICD-10-CM

## 2013-06-28 LAB — CBC WITH DIFFERENTIAL/PLATELET
BASO%: 0.5 % (ref 0.0–2.0)
HCT: 41.5 % (ref 38.4–49.9)
LYMPH%: 10.2 % — ABNORMAL LOW (ref 14.0–49.0)
MCHC: 33.3 g/dL (ref 32.0–36.0)
MCV: 81.7 fL (ref 79.3–98.0)
MONO#: 0.3 10*3/uL (ref 0.1–0.9)
NEUT%: 87.2 % — ABNORMAL HIGH (ref 39.0–75.0)
Platelets: 322 10*3/uL (ref 140–400)
WBC: 13 10*3/uL — ABNORMAL HIGH (ref 4.0–10.3)

## 2013-06-28 LAB — COMPREHENSIVE METABOLIC PANEL (CC13)
ALT: 37 U/L (ref 0–55)
CO2: 23 mEq/L (ref 22–29)
Creatinine: 1 mg/dL (ref 0.7–1.3)
Glucose: 214 mg/dl — ABNORMAL HIGH (ref 70–140)
Total Bilirubin: 0.43 mg/dL (ref 0.20–1.20)

## 2013-06-28 MED ORDER — SODIUM CHLORIDE 0.9 % IV SOLN
75.0000 mg/m2 | Freq: Once | INTRAVENOUS | Status: AC
  Administered 2013-06-28: 179 mg via INTRAVENOUS
  Filled 2013-06-28: qty 179

## 2013-06-28 MED ORDER — SODIUM CHLORIDE 0.9 % IV SOLN
150.0000 mg | Freq: Once | INTRAVENOUS | Status: AC
  Administered 2013-06-28: 150 mg via INTRAVENOUS
  Filled 2013-06-28: qty 5

## 2013-06-28 MED ORDER — POTASSIUM CHLORIDE 2 MEQ/ML IV SOLN
Freq: Once | INTRAVENOUS | Status: AC
  Administered 2013-06-28: 11:00:00 via INTRAVENOUS
  Filled 2013-06-28: qty 10

## 2013-06-28 MED ORDER — SODIUM CHLORIDE 0.9 % IV SOLN
500.0000 mg/m2 | Freq: Once | INTRAVENOUS | Status: AC
  Administered 2013-06-28: 1200 mg via INTRAVENOUS
  Filled 2013-06-28: qty 48

## 2013-06-28 MED ORDER — SODIUM CHLORIDE 0.9 % IV SOLN
Freq: Once | INTRAVENOUS | Status: AC
  Administered 2013-06-28: 11:00:00 via INTRAVENOUS

## 2013-06-28 MED ORDER — DEXAMETHASONE SODIUM PHOSPHATE 20 MG/5ML IJ SOLN
12.0000 mg | Freq: Once | INTRAMUSCULAR | Status: AC
  Administered 2013-06-28: 12 mg via INTRAVENOUS

## 2013-06-28 MED ORDER — PALONOSETRON HCL INJECTION 0.25 MG/5ML
0.2500 mg | Freq: Once | INTRAVENOUS | Status: AC
  Administered 2013-06-28: 0.25 mg via INTRAVENOUS

## 2013-06-28 MED ORDER — GABAPENTIN 100 MG PO CAPS: 100.0000 mg | ORAL_CAPSULE | Freq: Three times a day (TID) | ORAL | Status: DC

## 2013-06-28 NOTE — Patient Instructions (Addendum)
[facility] Discharge Instructions for Patients Receiving Chemotherapy  Today you received the following chemotherapy agents :  Alimta, Cisplatin.  To help prevent nausea and vomiting after your treatment, we encourage you to take your nausea medication as instructed by your physician.  Take Compazine 10 mg every 6 hrs as needed for nausea.  DO NOT Eat greasy nor spicy foods.  DO Drink lots of fluids.  DO NOT Drive after taking Compazine as it can cause drowsiness.   If you develop nausea and vomiting that is not controlled by your nausea medication, call the clinic.   BELOW ARE SYMPTOMS THAT SHOULD BE REPORTED IMMEDIATELY:  *FEVER GREATER THAN 100.5 F  *CHILLS WITH OR WITHOUT FEVER  NAUSEA AND VOMITING THAT IS NOT CONTROLLED WITH YOUR NAUSEA MEDICATION  *UNUSUAL SHORTNESS OF BREATH  *UNUSUAL BRUISING OR BLEEDING  TENDERNESS IN MOUTH AND THROAT WITH OR WITHOUT PRESENCE OF ULCERS  *URINARY PROBLEMS  *BOWEL PROBLEMS  UNUSUAL RASH Items with * indicate a potential emergency and should be followed up as soon as possible.  Feel free to call the clinic you have any questions or concerns. The clinic phone number is [phone].

## 2013-06-28 NOTE — Progress Notes (Signed)
Pt voided 600 prior to cisplatin. Total void for treatment 1550 cc

## 2013-06-28 NOTE — Progress Notes (Signed)
[facility] [phone]   [phone]  OFFICE PROGRESS NOTE  Ailene Ravel, MD Dr. Sharman Crate [place]rick [address] 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  2) stage IIB (T3, N1, M0) non-small cell lung cancer, adenocarcinoma diagnosed in June of 2014.   PRIOR THERAPY:  1) Status post 3 cycles of systemic chemotherapy with carboplatin and etoposide. Last dose was given on 07/16/2011.  2) Left video-assisted thoracoscopy, mini thoracotomy; wedge, posterior segment of left upper lobe; wedge, anterior segment of left lower lobe with node dissection.  3) Redo left video-assisted thoracoscopy, lysis of adhesions, wedge resection of left lower lobe, thoracoscopic left lower lobectomy under the care of Dr. Dorris Fetch on 05/03/2013.   CURRENT THERAPY: Adjuvant chemotherapy with cisplatin 75 mg/M2 and Alimta 500 mg/M2 every 3 weeks, first dose 06/28/2013.   CHEMOTHERAPY INTENT: Adjuvant/Curative.  CURRENT # OF CHEMOTHERAPY CYCLES: 1  CURRENT ANTIEMETICS: N/A  CURRENT SMOKING STATUS: Quit smoking 05/02/13  ORAL CHEMOTHERAPY AND CONSENT: None  CURRENT BISPHOSPHONATES USE: None  PAIN MANAGEMENT: 2/10  NARCOTICS INDUCED CONSTIPATION: None  LIVING WILL AND CODE STATUS: Full Code   INTERVAL HISTORY: [name] [name] 57 y.o. male returns to the clinic today for followup visit accompanied his wife. The patient is feeling fine today with no specific complaints except for the persistent pain at the left side of the chest secondary to his recent surgery. He is currently on Vicodin every 6 hours as needed with mild relief of his pain. He also continues to have significant numbness to the front of his chest on the left side. He denied having any significant shortness of breath, cough or hemoptysis. He is here today to start the first cycle of  adjuvant chemotherapy with cisplatin and Alimta. He has no fever or chills. No weight loss or night sweats.  MEDICAL HISTORY: Past Medical History  Diagnosis Date  . Hypercholesteremia     takes Pravastatin nightly  . Lung cancer   . HTN (hypertension)     takes Lisinopril and HCTZ daily  . Shortness of breath     with exertion;Albuterol inhaler prn  . Insomnia     takes Xanax prn    ALLERGIES:  has No Known Allergies.  MEDICATIONS:  Current Outpatient Prescriptions  Medication Sig Dispense Refill  . albuterol (PROVENTIL HFA;VENTOLIN HFA) 108 (90 BASE) MCG/ACT inhaler Inhale 1-2 puffs into the lungs every 4 (four) hours as needed for wheezing.      Marland Kitchen ALPRAZolam (XANAX) 0.25 MG tablet Take 1 tablet (0.25 mg total) by mouth at bedtime as needed for sleep.  30 tablet  0  . aspirin 81 MG tablet Take 81 mg by mouth daily.      . bisacodyl (DULCOLAX) 5 MG EC tablet Take 5 mg by mouth daily as needed for constipation.      Marland Kitchen dexamethasone (DECADRON) 4 MG tablet 4 Milligram by mouth twice a day the day before, day of and day after the chemotherapy every three-week  30 tablet  0  . folic acid (FOLVITE) 1 MG tablet Take 1 tablet (1 mg total) by mouth daily.  30 tablet  3  . HYDROcodone-acetaminophen (NORCO) 10-325 MG per tablet Take 1 tablet by mouth every 6 (six) hours as needed for pain.  40 tablet  0  . lisinopril-hydrochlorothiazide (PRINZIDE,ZESTORETIC) 20-25 MG per tablet       .  pravastatin (PRAVACHOL) 40 MG tablet Take 40 mg by mouth daily.      . prochlorperazine (COMPAZINE) 10 MG tablet Take 1 tablet (10 mg total) by mouth every 6 (six) hours as needed.  60 tablet  0   No current facility-administered medications for this visit.    SURGICAL HISTORY:  Past Surgical History  Procedure Laterality Date  . Left video-assisted thoracoscopy, mini thoracotomy; wedge, posterior segment of left upper lobe; wedge, anterior segment of left lower lobe with node dissection.  09/02/11     BURNEY  . Video assisted thoracoscopy Left 05/03/2013    Procedure: REDO VIDEO ASSISTED THORACOSCOPY;  Surgeon: Loreli Slot, MD;  Location: [facility] OR;  Service: Thoracic;  Laterality: Left;  REDO LEFT VATS  . Thoracotomy Left 05/03/2013    Procedure: THORACOTOMY MAJOR;  Surgeon: Loreli Slot, MD;  Location: [facility] OR;  Service: Thoracic;  Laterality: Left;  . Lobectomy Left 05/03/2013    Procedure: LOBECTOMY;  Surgeon: Loreli Slot, MD;  Location: [facility] OR;  Service: Thoracic;  Laterality: Left;  . Wedge resection Left 05/03/2013    Procedure: WEDGE RESECTION;  Surgeon: Loreli Slot, MD;  Location: [facility] OR;  Service: Thoracic;  Laterality: Left;    REVIEW OF SYSTEMS:  A comprehensive review of systems was negative except for: Constitutional: positive for fatigue Musculoskeletal: positive for Pain on the left side of the chest   PHYSICAL EXAMINATION: General appearance: alert, cooperative and no distress Head: Normocephalic, without obvious abnormality, atraumatic Neck: no adenopathy Lymph nodes: Cervical, supraclavicular, and axillary nodes normal. Resp: clear to auscultation bilaterally Cardio: regular rate and rhythm, S1, S2 normal, no murmur, click, rub or gallop GI: soft, non-tender; bowel sounds normal; no masses,  no organomegaly Extremities: extremities normal, atraumatic, no cyanosis or edema Neurologic: Alert and oriented X 3, normal strength and tone. Normal symmetric reflexes. Normal coordination and gait  ECOG PERFORMANCE STATUS: 1 - Symptomatic but completely ambulatory  Blood pressure 134/78, pulse 82, temperature 97 F (36.1 C), temperature source Oral, resp. rate 20, height 6\' 1"  (1.854 m), weight 246 lb 4.8 oz (111.721 kg).  LABORATORY DATA: Lab Results  Component Value Date   WBC 13.0* 06/28/2013   HGB 13.8 06/28/2013   HCT 41.5 06/28/2013   MCV 81.7 06/28/2013   PLT 322 06/28/2013      Chemistry      Component Value Date/Time   NA 139 06/28/2013  0827   NA 135 05/05/2013 0345   NA 138 03/25/2012 1225   K 4.3 06/28/2013 0827   K 3.6 05/05/2013 0345   K 4.2 03/25/2012 1225   CL 100 05/05/2013 0345   CL 105 03/03/2013 1002   CL 98 03/25/2012 1225   CO2 23 06/28/2013 0827   CO2 26 05/05/2013 0345   CO2 26 03/25/2012 1225   BUN 13.1 06/28/2013 0827   BUN 5* 05/05/2013 0345   BUN 11 03/25/2012 1225   CREATININE 1.0 06/28/2013 0827   CREATININE 0.83 05/05/2013 0345   CREATININE 1.0 03/25/2012 1225      Component Value Date/Time   CALCIUM 9.5 06/28/2013 0827   CALCIUM 7.9* 05/05/2013 0345   CALCIUM 8.6 03/25/2012 1225   ALKPHOS 79 06/28/2013 0827   ALKPHOS 74 05/05/2013 0345   ALKPHOS 84 03/25/2012 1225   AST 22 06/28/2013 0827   AST 37 05/05/2013 0345   AST 33 03/25/2012 1225   ALT 37 06/28/2013 0827   ALT 28 05/05/2013 0345   ALT 43 03/25/2012 1225  BILITOT 0.43 06/28/2013 0827   BILITOT 0.5 05/05/2013 0345   BILITOT 0.80 03/25/2012 1225       RADIOGRAPHIC STUDIES: No results found.  ASSESSMENT AND PLAN: This is a very pleasant 57 years old white male recently diagnosed with a stage IIb non-small cell lung cancer, adenocarcinoma status post left lower lobectomy. The patient is here today to start the first cycle of adjuvant chemotherapy with cisplatin and Alimta. I reviewed with the patient adverse effect of this treatment again including but not limited to mild alopecia, myelosuppression, nausea and vomiting, peripheral neuropathy, liver or in dysfunction as well as hearing deficit. For the chest pain and neuropathic pain on the left side of the chest, the patient will continue on treatment with Vicodin every 6 hours in addition, I will start the patient on Neurontin 100 mg by mouth 3 times a day. He would come back for followup visit in 3 weeks with the start of cycle #2 He was advised to call immediately if he has any concerning symptoms in the interval. The patient voices understanding of current disease status and treatment options and is in  agreement with the current care plan.  All questions were answered. The patient knows to call the clinic with any problems, questions or concerns. We can certainly see the patient much sooner if necessary.  I spent 15 minutes counseling the patient face to face. The total time spent in the appointment was 25 minutes.

## 2013-06-28 NOTE — Telephone Encounter (Signed)
Gave pt appt for lab, Md and chemo for August and Septe, emailed Marcelino Duster to adjust chemo on 9/8

## 2013-06-29 ENCOUNTER — Telehealth: Payer: Self-pay | Admitting: *Deleted

## 2013-06-29 ENCOUNTER — Encounter: Payer: Self-pay | Admitting: Thoracic Surgery (Cardiothoracic Vascular Surgery)

## 2013-06-29 NOTE — Telephone Encounter (Signed)
Message copied by Augusto Garbe on Tue Jun 29, 2013  4:38 PM ------      Message from: Normajean Baxter LE      Created: Tue Jun 29, 2013  7:15 AM      Regarding: Chemo f/u call       First time Alimta, Cisplatin, Dr, Donnald Garre, TB, RN ------

## 2013-06-29 NOTE — Telephone Encounter (Signed)
Called [name] for chemotherapy F/U.  Patient is doing well.  Reports this treatment t ook longer than what he received back in 2012.  Reports he ate late today but denies n/v.  Denies any new side effects or symptoms.  Bowel and bladder is functioning well.  Eating and drinking well and I instructed to drink 64 oz minimum daily or at least the day before, of and after treatment.  Denies questions at this time and encouraged to call if needed.  Reviewed how to call after hours in the case of an emergency.

## 2013-06-29 NOTE — Telephone Encounter (Signed)
Per staff message and POF I have scheduled appts.  JMW  

## 2013-06-30 ENCOUNTER — Telehealth: Payer: Self-pay | Admitting: Internal Medicine

## 2013-06-30 NOTE — Telephone Encounter (Signed)
Talked to pt and gave her appt for August and September 2014

## 2013-06-30 NOTE — Telephone Encounter (Signed)
Called pt,left message regarding appt for August and September 2014

## 2013-07-01 ENCOUNTER — Inpatient Hospital Stay (HOSPITAL_COMMUNITY)
Admission: EM | Admit: 2013-07-01 | Discharge: 2013-07-05 | DRG: 218 | Disposition: A | Discharge status: Home Health | Payer: BC Managed Care – PPO | Attending: Internal Medicine | Admitting: Internal Medicine

## 2013-07-01 ENCOUNTER — Inpatient Hospital Stay (HOSPITAL_COMMUNITY): Payer: BC Managed Care – PPO | Admitting: Anesthesiology

## 2013-07-01 ENCOUNTER — Encounter (HOSPITAL_COMMUNITY): Payer: Self-pay | Admitting: Anesthesiology

## 2013-07-01 ENCOUNTER — Telehealth: Payer: Self-pay | Admitting: *Deleted

## 2013-07-01 ENCOUNTER — Inpatient Hospital Stay (HOSPITAL_COMMUNITY): Payer: BC Managed Care – PPO

## 2013-07-01 ENCOUNTER — Encounter (HOSPITAL_COMMUNITY)
Admission: EM | Disposition: A | Discharge status: Home Health | Payer: Self-pay | Source: Home / Self Care | Attending: Internal Medicine

## 2013-07-01 ENCOUNTER — Encounter (HOSPITAL_COMMUNITY): Payer: Self-pay | Admitting: *Deleted

## 2013-07-01 ENCOUNTER — Emergency Department (HOSPITAL_COMMUNITY): Payer: BC Managed Care – PPO

## 2013-07-01 DIAGNOSIS — C3490 Malignant neoplasm of unspecified part of unspecified bronchus or lung: Secondary | ICD-10-CM | POA: Diagnosis present

## 2013-07-01 DIAGNOSIS — E7800 Pure hypercholesterolemia, unspecified: Secondary | ICD-10-CM | POA: Diagnosis present

## 2013-07-01 DIAGNOSIS — Z902 Acquired absence of lung [part of]: Secondary | ICD-10-CM

## 2013-07-01 DIAGNOSIS — D72829 Elevated white blood cell count, unspecified: Secondary | ICD-10-CM | POA: Diagnosis present

## 2013-07-01 DIAGNOSIS — S82401A Unspecified fracture of shaft of right fibula, initial encounter for closed fracture: Secondary | ICD-10-CM

## 2013-07-01 DIAGNOSIS — R55 Syncope and collapse: Secondary | ICD-10-CM

## 2013-07-01 DIAGNOSIS — Z87891 Personal history of nicotine dependence: Secondary | ICD-10-CM

## 2013-07-01 DIAGNOSIS — J449 Chronic obstructive pulmonary disease, unspecified: Secondary | ICD-10-CM | POA: Diagnosis present

## 2013-07-01 DIAGNOSIS — Z79899 Other long term (current) drug therapy: Secondary | ICD-10-CM

## 2013-07-01 DIAGNOSIS — J4489 Other specified chronic obstructive pulmonary disease: Secondary | ICD-10-CM | POA: Diagnosis present

## 2013-07-01 DIAGNOSIS — E871 Hypo-osmolality and hyponatremia: Secondary | ICD-10-CM | POA: Diagnosis present

## 2013-07-01 DIAGNOSIS — Z7982 Long term (current) use of aspirin: Secondary | ICD-10-CM

## 2013-07-01 DIAGNOSIS — S82899A Other fracture of unspecified lower leg, initial encounter for closed fracture: Principal | ICD-10-CM | POA: Diagnosis present

## 2013-07-01 DIAGNOSIS — S82209A Unspecified fracture of shaft of unspecified tibia, initial encounter for closed fracture: Secondary | ICD-10-CM

## 2013-07-01 DIAGNOSIS — S82201P Unspecified fracture of shaft of right tibia, subsequent encounter for closed fracture with malunion: Secondary | ICD-10-CM

## 2013-07-01 DIAGNOSIS — W010XXA Fall on same level from slipping, tripping and stumbling without subsequent striking against object, initial encounter: Secondary | ICD-10-CM | POA: Diagnosis present

## 2013-07-01 DIAGNOSIS — S82201A Unspecified fracture of shaft of right tibia, initial encounter for closed fracture: Secondary | ICD-10-CM

## 2013-07-01 DIAGNOSIS — I10 Essential (primary) hypertension: Secondary | ICD-10-CM | POA: Diagnosis present

## 2013-07-01 DIAGNOSIS — Y9289 Other specified places as the place of occurrence of the external cause: Secondary | ICD-10-CM

## 2013-07-01 DIAGNOSIS — G4700 Insomnia, unspecified: Secondary | ICD-10-CM | POA: Diagnosis present

## 2013-07-01 DIAGNOSIS — E785 Hyperlipidemia, unspecified: Secondary | ICD-10-CM | POA: Diagnosis present

## 2013-07-01 DIAGNOSIS — C3492 Malignant neoplasm of unspecified part of left bronchus or lung: Secondary | ICD-10-CM

## 2013-07-01 HISTORY — PX: OPEN REDUCTION INTERNAL FIXATION (ORIF) TIBIA/FIBULA FRACTURE: SHX5992

## 2013-07-01 LAB — COMPREHENSIVE METABOLIC PANEL
ALT: 37 U/L (ref 0–53)
Alkaline Phosphatase: 51 U/L (ref 39–117)
CO2: 28 mEq/L (ref 19–32)
Chloride: 94 mEq/L — ABNORMAL LOW (ref 96–112)
GFR calc Af Amer: >90 mL/min (ref >90)
GFR calc non Af Amer: 81 mL/min — ABNORMAL LOW (ref >90)
Glucose, Bld: 86 mg/dL (ref 70–99)
Potassium: 3.5 mEq/L (ref 3.5–5.1)
Sodium: 130 mEq/L — ABNORMAL LOW (ref 135–145)
Total Bilirubin: 0.5 mg/dL (ref 0.3–1.2)

## 2013-07-01 LAB — CBC WITH DIFFERENTIAL/PLATELET
HCT: 37.9 % — ABNORMAL LOW (ref 39.0–52.0)
Hemoglobin: 12.7 g/dL — ABNORMAL LOW (ref 13.0–17.0)
Lymphs Abs: 2.7 10*3/uL (ref 0.7–4.0)
MCH: 27.3 pg (ref 26.0–34.0)
Monocytes Absolute: 0.4 10*3/uL (ref 0.1–1.0)
Monocytes Relative: 3 % (ref 3–12)
Neutro Abs: 8.9 10*3/uL — ABNORMAL HIGH (ref 1.7–7.7)
Neutrophils Relative %: 75 % (ref 43–77)
RBC: 4.65 MIL/uL (ref 4.22–5.81)

## 2013-07-01 LAB — URINALYSIS, ROUTINE W REFLEX MICROSCOPIC
Bilirubin Urine: NEGATIVE
Glucose, UA: NEGATIVE mg/dL
Hgb urine dipstick: NEGATIVE
Specific Gravity, Urine: 1.021 (ref 1.005–1.030)

## 2013-07-01 LAB — TYPE AND SCREEN
ABO/RH(D): B POS
Antibody Screen: NEGATIVE

## 2013-07-01 LAB — APTT: aPTT: 21 seconds — ABNORMAL LOW (ref 24–37)

## 2013-07-01 SURGERY — OPEN REDUCTION INTERNAL FIXATION (ORIF) TIBIA/FIBULA FRACTURE
Anesthesia: Spinal | Site: Leg Lower | Laterality: Right | Wound class: Clean

## 2013-07-01 MED ORDER — DEXTROSE-NACL 5-0.45 % IV SOLN: INTRAVENOUS | Status: DC

## 2013-07-01 MED ORDER — HYDROMORPHONE HCL PF 1 MG/ML IJ SOLN: 0.2500 mg | INTRAMUSCULAR | Status: DC | PRN

## 2013-07-01 MED ORDER — HYDROCODONE-ACETAMINOPHEN 10-325 MG PO TABS: 1.0000 | ORAL_TABLET | Freq: Four times a day (QID) | ORAL | Status: DC | PRN

## 2013-07-01 MED ORDER — ONDANSETRON HCL 4 MG/2ML IJ SOLN
INTRAMUSCULAR | Status: DC | PRN
  Administered 2013-07-01 (×2): 2 mg via INTRAVENOUS

## 2013-07-01 MED ORDER — LACTATED RINGERS IV SOLN
INTRAVENOUS | Status: DC | PRN
  Administered 2013-07-01: 22:00:00 via INTRAVENOUS

## 2013-07-01 MED ORDER — MEPERIDINE HCL 50 MG/ML IJ SOLN: 6.2500 mg | INTRAMUSCULAR | Status: DC | PRN

## 2013-07-01 MED ORDER — BISACODYL 5 MG PO TBEC: 5.0000 mg | DELAYED_RELEASE_TABLET | Freq: Every day | ORAL | Status: DC | PRN

## 2013-07-01 MED ORDER — EPHEDRINE SULFATE 50 MG/ML IJ SOLN
INTRAMUSCULAR | Status: DC | PRN
  Administered 2013-07-01 (×3): 10 mg via INTRAVENOUS

## 2013-07-01 MED ORDER — PROPOFOL INFUSION 10 MG/ML OPTIME
INTRAVENOUS | Status: DC | PRN
  Administered 2013-07-01: 75 mL via INTRAVENOUS
  Administered 2013-07-01 (×2): 50 mL via INTRAVENOUS

## 2013-07-01 MED ORDER — METHOCARBAMOL 100 MG/ML IJ SOLN
500.0000 mg | Freq: Four times a day (QID) | INTRAVENOUS | Status: DC | PRN
  Filled 2013-07-01: qty 5

## 2013-07-01 MED ORDER — THIAMINE HCL 100 MG/ML IJ SOLN
100.0000 mg | Freq: Every day | INTRAMUSCULAR | Status: DC
  Filled 2013-07-01 (×4): qty 1

## 2013-07-01 MED ORDER — ONDANSETRON HCL 4 MG PO TABS: 4.0000 mg | ORAL_TABLET | Freq: Four times a day (QID) | ORAL | Status: DC | PRN

## 2013-07-01 MED ORDER — DEXTROSE 5 % IV SOLN
3.0000 g | INTRAVENOUS | Status: DC | PRN
  Administered 2013-07-01: 3 g via INTRAVENOUS

## 2013-07-01 MED ORDER — VITAMIN B-1 100 MG PO TABS
100.0000 mg | ORAL_TABLET | Freq: Every day | ORAL | Status: DC
  Administered 2013-07-02 – 2013-07-05 (×4): 100 mg via ORAL
  Filled 2013-07-01 (×4): qty 1

## 2013-07-01 MED ORDER — OXYCODONE-ACETAMINOPHEN 5-325 MG PO TABS: 1.0000 | ORAL_TABLET | ORAL | Status: DC | PRN

## 2013-07-01 MED ORDER — MORPHINE SULFATE 2 MG/ML IJ SOLN
2.0000 mg | INTRAMUSCULAR | Status: DC | PRN
  Administered 2013-07-02 (×2): 2 mg via INTRAVENOUS
  Filled 2013-07-01 (×2): qty 1

## 2013-07-01 MED ORDER — LORAZEPAM 2 MG/ML IJ SOLN: 1.0000 mg | Freq: Four times a day (QID) | INTRAMUSCULAR | Status: AC | PRN

## 2013-07-01 MED ORDER — FLEET ENEMA 7-19 GM/118ML RE ENEM: 1.0000 | ENEMA | Freq: Once | RECTAL | Status: AC | PRN

## 2013-07-01 MED ORDER — ALPRAZOLAM 0.25 MG PO TABS: 0.2500 mg | ORAL_TABLET | Freq: Every evening | ORAL | Status: DC | PRN

## 2013-07-01 MED ORDER — CEFAZOLIN SODIUM 1-5 GM-% IV SOLN
INTRAVENOUS | Status: AC
  Filled 2013-07-01: qty 50

## 2013-07-01 MED ORDER — DOCUSATE SODIUM 100 MG PO CAPS
100.0000 mg | ORAL_CAPSULE | Freq: Two times a day (BID) | ORAL | Status: DC
  Administered 2013-07-02 – 2013-07-05 (×7): 100 mg via ORAL
  Filled 2013-07-01 (×9): qty 1

## 2013-07-01 MED ORDER — HYDROCHLOROTHIAZIDE 25 MG PO TABS
25.0000 mg | ORAL_TABLET | Freq: Every day | ORAL | Status: DC
  Administered 2013-07-02 – 2013-07-05 (×4): 25 mg via ORAL
  Filled 2013-07-01 (×4): qty 1

## 2013-07-01 MED ORDER — POTASSIUM CHLORIDE IN NACL 20-0.9 MEQ/L-% IV SOLN
INTRAVENOUS | Status: DC
  Administered 2013-07-02 (×2): via INTRAVENOUS
  Filled 2013-07-01 (×3): qty 1000

## 2013-07-01 MED ORDER — MORPHINE SULFATE 4 MG/ML IJ SOLN
4.0000 mg | Freq: Once | INTRAMUSCULAR | Status: AC
  Administered 2013-07-01: 4 mg via INTRAVENOUS
  Filled 2013-07-01: qty 1

## 2013-07-01 MED ORDER — FOLIC ACID 1 MG PO TABS
1.0000 mg | ORAL_TABLET | Freq: Every day | ORAL | Status: DC
  Administered 2013-07-02: 1 mg via ORAL
  Filled 2013-07-01: qty 1

## 2013-07-01 MED ORDER — METOCLOPRAMIDE HCL 10 MG PO TABS: 5.0000 mg | ORAL_TABLET | Freq: Three times a day (TID) | ORAL | Status: DC | PRN

## 2013-07-01 MED ORDER — ONDANSETRON HCL 4 MG/2ML IJ SOLN: 4.0000 mg | Freq: Once | INTRAMUSCULAR | Status: DC | PRN

## 2013-07-01 MED ORDER — SODIUM CHLORIDE 0.9 % IJ SOLN
3.0000 mL | Freq: Two times a day (BID) | INTRAMUSCULAR | Status: DC
  Administered 2013-07-02 – 2013-07-04 (×5): 3 mL via INTRAVENOUS

## 2013-07-01 MED ORDER — METOCLOPRAMIDE HCL 5 MG/ML IJ SOLN: 5.0000 mg | Freq: Three times a day (TID) | INTRAMUSCULAR | Status: DC | PRN

## 2013-07-01 MED ORDER — ACETAMINOPHEN 325 MG PO TABS: 650.0000 mg | ORAL_TABLET | Freq: Four times a day (QID) | ORAL | Status: DC | PRN

## 2013-07-01 MED ORDER — SODIUM CHLORIDE 0.9 % IV SOLN
INTRAVENOUS | Status: DC | PRN
  Administered 2013-07-01: 20:00:00 via INTRAVENOUS

## 2013-07-01 MED ORDER — ONDANSETRON HCL 4 MG/2ML IJ SOLN: 4.0000 mg | Freq: Four times a day (QID) | INTRAMUSCULAR | Status: DC | PRN

## 2013-07-01 MED ORDER — LORAZEPAM 1 MG PO TABS: 1.0000 mg | ORAL_TABLET | Freq: Four times a day (QID) | ORAL | Status: AC | PRN

## 2013-07-01 MED ORDER — 0.9 % SODIUM CHLORIDE (POUR BTL) OPTIME
TOPICAL | Status: DC | PRN
  Administered 2013-07-01: 1000 mL

## 2013-07-01 MED ORDER — FOLIC ACID 1 MG PO TABS
1.0000 mg | ORAL_TABLET | Freq: Every morning | ORAL | Status: DC
  Administered 2013-07-03 – 2013-07-05 (×3): 1 mg via ORAL
  Filled 2013-07-01 (×4): qty 1

## 2013-07-01 MED ORDER — ENOXAPARIN SODIUM 40 MG/0.4ML ~~LOC~~ SOLN: 40.0000 mg | SUBCUTANEOUS | Status: DC

## 2013-07-01 MED ORDER — ADULT MULTIVITAMIN W/MINERALS CH
1.0000 | ORAL_TABLET | Freq: Every day | ORAL | Status: DC
  Administered 2013-07-02 – 2013-07-05 (×4): 1 via ORAL
  Filled 2013-07-01 (×4): qty 1

## 2013-07-01 MED ORDER — DIPHENHYDRAMINE HCL 12.5 MG/5ML PO ELIX: 12.5000 mg | ORAL_SOLUTION | ORAL | Status: DC | PRN

## 2013-07-01 MED ORDER — BUPIVACAINE IN DEXTROSE 0.75-8.25 % IT SOLN
INTRATHECAL | Status: DC | PRN
  Administered 2013-07-01: 2 mL via INTRATHECAL

## 2013-07-01 MED ORDER — DEXAMETHASONE 4 MG PO TABS
4.0000 mg | ORAL_TABLET | Freq: Two times a day (BID) | ORAL | Status: DC
  Administered 2013-07-02 – 2013-07-05 (×8): 4 mg via ORAL
  Filled 2013-07-01 (×9): qty 1

## 2013-07-01 MED ORDER — FENTANYL CITRATE 0.05 MG/ML IJ SOLN
50.0000 ug | Freq: Once | INTRAMUSCULAR | Status: AC
  Administered 2013-07-01: 50 ug via INTRAVENOUS
  Filled 2013-07-01: qty 2

## 2013-07-01 MED ORDER — ENOXAPARIN SODIUM 40 MG/0.4ML ~~LOC~~ SOLN
40.0000 mg | Freq: Every day | SUBCUTANEOUS | Status: DC
  Administered 2013-07-02: 40 mg via SUBCUTANEOUS
  Filled 2013-07-01: qty 0.4

## 2013-07-01 MED ORDER — HYDROMORPHONE HCL PF 1 MG/ML IJ SOLN
0.5000 mg | INTRAMUSCULAR | Status: DC | PRN
  Administered 2013-07-02 (×4): 1 mg via INTRAVENOUS
  Filled 2013-07-01 (×4): qty 1

## 2013-07-01 MED ORDER — CEFAZOLIN SODIUM-DEXTROSE 2-3 GM-% IV SOLR
2.0000 g | Freq: Four times a day (QID) | INTRAVENOUS | Status: AC
  Administered 2013-07-02 (×2): 2 g via INTRAVENOUS
  Filled 2013-07-01 (×2): qty 50

## 2013-07-01 MED ORDER — ZOLPIDEM TARTRATE 5 MG PO TABS
5.0000 mg | ORAL_TABLET | Freq: Every evening | ORAL | Status: DC | PRN
  Filled 2013-07-01: qty 1

## 2013-07-01 MED ORDER — LISINOPRIL-HYDROCHLOROTHIAZIDE 20-25 MG PO TABS: 1.0000 | ORAL_TABLET | Freq: Every morning | ORAL | Status: DC

## 2013-07-01 MED ORDER — SENNOSIDES-DOCUSATE SODIUM 8.6-50 MG PO TABS
1.0000 | ORAL_TABLET | Freq: Every evening | ORAL | Status: DC | PRN
  Filled 2013-07-01: qty 1

## 2013-07-01 MED ORDER — CLINDAMYCIN PHOSPHATE 900 MG/50ML IV SOLN
900.0000 mg | Freq: Once | INTRAVENOUS | Status: AC
  Administered 2013-07-01: 900 mg via INTRAVENOUS

## 2013-07-01 MED ORDER — KETAMINE HCL 10 MG/ML IJ SOLN
INTRAMUSCULAR | Status: DC | PRN
  Administered 2013-07-01 (×3): 10 mg via INTRAVENOUS

## 2013-07-01 MED ORDER — LISINOPRIL 20 MG PO TABS
20.0000 mg | ORAL_TABLET | Freq: Every day | ORAL | Status: DC
  Administered 2013-07-02 – 2013-07-05 (×4): 20 mg via ORAL
  Filled 2013-07-01 (×4): qty 1

## 2013-07-01 MED ORDER — SODIUM CHLORIDE 0.9 % IV SOLN
INTRAVENOUS | Status: DC
  Administered 2013-07-01: 19:00:00 via INTRAVENOUS

## 2013-07-01 MED ORDER — CEFAZOLIN SODIUM-DEXTROSE 2-3 GM-% IV SOLR
INTRAVENOUS | Status: AC
  Filled 2013-07-01: qty 50

## 2013-07-01 MED ORDER — SODIUM CHLORIDE 0.9 % IV BOLUS (SEPSIS)
1000.0000 mL | Freq: Once | INTRAVENOUS | Status: AC
  Administered 2013-07-01: 1000 mL via INTRAVENOUS

## 2013-07-01 MED ORDER — MIDAZOLAM HCL 5 MG/5ML IJ SOLN
INTRAMUSCULAR | Status: DC | PRN
  Administered 2013-07-01: 0.5 mg via INTRAVENOUS
  Administered 2013-07-01: 1 mg via INTRAVENOUS
  Administered 2013-07-01: 0.5 mg via INTRAVENOUS
  Administered 2013-07-01: 1 mg via INTRAVENOUS

## 2013-07-01 MED ORDER — LACTATED RINGERS IV SOLN
INTRAVENOUS | Status: DC | PRN
  Administered 2013-07-01: 20:00:00 via INTRAVENOUS

## 2013-07-01 MED ORDER — FENTANYL CITRATE 0.05 MG/ML IJ SOLN
INTRAMUSCULAR | Status: DC | PRN
  Administered 2013-07-01 (×3): 50 ug via INTRAVENOUS

## 2013-07-01 MED ORDER — CLINDAMYCIN PHOSPHATE 900 MG/50ML IV SOLN
INTRAVENOUS | Status: AC
  Filled 2013-07-01: qty 50

## 2013-07-01 MED ORDER — PROPOFOL INFUSION 10 MG/ML OPTIME
INTRAVENOUS | Status: DC | PRN
  Administered 2013-07-01: 75 ug/kg/min via INTRAVENOUS

## 2013-07-01 MED ORDER — GABAPENTIN 100 MG PO CAPS
100.0000 mg | ORAL_CAPSULE | Freq: Three times a day (TID) | ORAL | Status: DC
  Administered 2013-07-02 – 2013-07-05 (×12): 100 mg via ORAL
  Filled 2013-07-01 (×13): qty 1

## 2013-07-01 MED ORDER — METHOCARBAMOL 500 MG PO TABS: 500.0000 mg | ORAL_TABLET | Freq: Four times a day (QID) | ORAL | Status: DC | PRN

## 2013-07-01 MED ORDER — ALUM & MAG HYDROXIDE-SIMETH 200-200-20 MG/5ML PO SUSP
30.0000 mL | Freq: Four times a day (QID) | ORAL | Status: DC | PRN
  Administered 2013-07-03: 30 mL via ORAL
  Filled 2013-07-01: qty 30

## 2013-07-01 MED ORDER — HYDROCODONE-ACETAMINOPHEN 5-325 MG PO TABS: 1.0000 | ORAL_TABLET | ORAL | Status: DC | PRN

## 2013-07-01 MED ORDER — ACETAMINOPHEN 650 MG RE SUPP: 650.0000 mg | Freq: Four times a day (QID) | RECTAL | Status: DC | PRN

## 2013-07-01 SURGICAL SUPPLY — 62 items
BAG ZIPLOCK 12X15 (MISCELLANEOUS) IMPLANT
BANDAGE ELASTIC 4 VELCRO ST LF (GAUZE/BANDAGES/DRESSINGS) ×2 IMPLANT
BANDAGE ELASTIC 6 VELCRO ST LF (GAUZE/BANDAGES/DRESSINGS) ×2 IMPLANT
BANDAGE GAUZE ELAST BULKY 4 IN (GAUZE/BANDAGES/DRESSINGS) IMPLANT
BIT DRILL 3.8X6 NS (BIT) ×2 IMPLANT
BIT DRILL 4.4 NS (BIT) ×2 IMPLANT
CHLORAPREP W/TINT 26ML (MISCELLANEOUS) IMPLANT
CLOTH 2% CHLOROHEXIDINE 3PK (PERSONAL CARE ITEMS) ×4 IMPLANT
CLOTH BEACON ORANGE TIMEOUT ST (SAFETY) ×2 IMPLANT
CUFF TOURN SGL QUICK 34 (TOURNIQUET CUFF)
CUFF TRNQT CYL 34X4X40X1 (TOURNIQUET CUFF) IMPLANT
DRAPE C-ARM 42X120 X-RAY (DRAPES) IMPLANT
DRAPE C-ARMOR (DRAPES) ×2 IMPLANT
DRAPE EXTREMITY T 121X128X90 (DRAPE) ×2 IMPLANT
DRAPE POUCH INSTRU U-SHP 10X18 (DRAPES) ×2 IMPLANT
DRAPE U-SHAPE 47X51 STRL (DRAPES) ×2 IMPLANT
DRSG ADAPTIC 3X8 NADH LF (GAUZE/BANDAGES/DRESSINGS) ×2 IMPLANT
DRSG PAD ABDOMINAL 8X10 ST (GAUZE/BANDAGES/DRESSINGS) ×2 IMPLANT
DURAPREP 26ML APPLICATOR (WOUND CARE) ×4 IMPLANT
ELECT REM PT RETURN 9FT ADLT (ELECTROSURGICAL) ×2
ELECTRODE REM PT RTRN 9FT ADLT (ELECTROSURGICAL) ×1 IMPLANT
GLOVE BIOGEL PI IND STRL 7.0 (GLOVE) ×2 IMPLANT
GLOVE BIOGEL PI IND STRL 7.5 (GLOVE) ×1 IMPLANT
GLOVE BIOGEL PI IND STRL 8 (GLOVE) ×1 IMPLANT
GLOVE BIOGEL PI INDICATOR 7.0 (GLOVE) ×2
GLOVE BIOGEL PI INDICATOR 7.5 (GLOVE) ×1
GLOVE BIOGEL PI INDICATOR 8 (GLOVE) ×1
GLOVE SURG SS PI 6.5 STRL IVOR (GLOVE) ×4 IMPLANT
GLOVE SURG SS PI 7.5 STRL IVOR (GLOVE) ×2 IMPLANT
GLOVE SURG SS PI 8.0 STRL IVOR (GLOVE) ×4 IMPLANT
GOWN PREVENTION PLUS LG XLONG (DISPOSABLE) ×2 IMPLANT
GOWN STRL NON-REIN LRG LVL3 (GOWN DISPOSABLE) ×2 IMPLANT
GOWN STRL REIN XL XLG (GOWN DISPOSABLE) ×2 IMPLANT
GUIDEWIRE BALL NOSE 80CM (WIRE) ×2 IMPLANT
IMMOBILIZER KNEE 22 UNIV (SOFTGOODS) ×2 IMPLANT
IV CATH 14GX2 1/4 (CATHETERS) IMPLANT
KIT BASIN OR (CUSTOM PROCEDURE TRAY) ×4 IMPLANT
MANIFOLD NEPTUNE II (INSTRUMENTS) ×2 IMPLANT
NAIL TIBIAL 11MM X 37.5CM (Nail) ×1 IMPLANT
NEEDLE HYPO 22GX1.5 SAFETY (NEEDLE) IMPLANT
PACK LOWER EXTREMITY WL (CUSTOM PROCEDURE TRAY) IMPLANT
PACK TOTAL JOINT (CUSTOM PROCEDURE TRAY) ×2 IMPLANT
PAD CAST 4YDX4 CTTN HI CHSV (CAST SUPPLIES) IMPLANT
PADDING CAST COTTON 4X4 STRL (CAST SUPPLIES)
PADDING CAST COTTON 6X4 STRL (CAST SUPPLIES) ×2 IMPLANT
PIN GUIDE ACE (PIN) ×2 IMPLANT
POSITIONER SURGICAL ARM (MISCELLANEOUS) ×2 IMPLANT
SCREW ACECAP 42MM (Screw) ×2 IMPLANT
SCREW PROXIMAL DEPUY (Screw) ×1 IMPLANT
SCREW PROXIMAL75MMLX5.5MM (Screw) ×2 IMPLANT
SCREW PRXML FT 50X5.5XLCK NS (Screw) ×1 IMPLANT
SPONGE GAUZE 4X4 12PLY (GAUZE/BANDAGES/DRESSINGS) ×2 IMPLANT
SUCTION FRAZIER 12FR DISP (SUCTIONS) IMPLANT
SUT ETHILON 4 0 PS 2 18 (SUTURE) IMPLANT
SUT NURALON 4 0 TR CR/8 (SUTURE) IMPLANT
SUT VIC AB 0 CT1 36 (SUTURE) ×2 IMPLANT
SUT VIC AB 1 CT1 27 (SUTURE) ×2
SUT VIC AB 1 CT1 27XBRD ANTBC (SUTURE) ×2 IMPLANT
SUT VIC AB 2-0 CT1 27 (SUTURE) ×1
SUT VIC AB 2-0 CT1 TAPERPNT 27 (SUTURE) ×1 IMPLANT
SYR CONTROL 10ML LL (SYRINGE) IMPLANT
TIBIAL NAIL 11MM X 37.5CM (Nail) ×2 IMPLANT

## 2013-07-01 NOTE — H&P (Signed)
PCP:   Ailene Ravel, MD  Oncologists: Dr. Jerolyn Center Cardiothoracic surgeon: Dr. Dorris Fetch  Chief Complaint:  Fall  HPI: This is a 57 year old gentleman with a diagnosis of lung cancer recurrent. He had a pneumonectomy in June 2014 and has had his first round of chemotherapy Monday. Today he was at the pool by a friend, he states he was in the shade, he stood became lightheaded and fell. He was placed in a chair, later got up to get a drink, became lightheaded again and fell, this time he twisted his ankle and was unable to stand. He states he had no loss of consciousness, no palpitations, he did not hit his head. He denies any chest pains, no history of cardiac disease, no recent congestive heart failure episode. He does have some chronic shortness of breath related to his COPD and his 2 pneumonectomies, the first one was in 2012. The patient's stopped smoking 2 months ago. He's not on home oxygen, he uses a nebulizer occasionally. As stated he had a recent surgery, he tolerated anesthesia well. He does drink a sixpack of beer daily, he denies any withdrawal symptoms. History provided by the patient, his wife is present at bedside  Review of Systems:  The patient denies anorexia, fever, weight loss,, vision loss, decreased hearing, hoarseness, chest pain, syncope, dyspnea on exertion, peripheral edema, balance deficits, hemoptysis, abdominal pain, melena, hematochezia, severe indigestion/heartburn, hematuria, incontinence, genital sores, muscle weakness, suspicious skin lesions, transient blindness, difficulty walking, depression, unusual weight change, abnormal bleeding, enlarged lymph nodes, angioedema, and breast masses.  Past Medical History: Past Medical History  Diagnosis Date  . Hypercholesteremia     takes Pravastatin nightly  . Lung cancer   . HTN (hypertension)     takes Lisinopril and HCTZ daily  . Shortness of breath     with exertion;Albuterol inhaler prn  . Insomnia      takes Xanax prn   Past Surgical History  Procedure Laterality Date  . Left video-assisted thoracoscopy, mini thoracotomy; wedge, posterior segment of left upper lobe; wedge, anterior segment of left lower lobe with node dissection.  09/02/11    BURNEY  . Video assisted thoracoscopy Left 05/03/2013    Procedure: REDO VIDEO ASSISTED THORACOSCOPY;  Surgeon: Loreli Slot, MD;  Location: [facility] OR;  Service: Thoracic;  Laterality: Left;  REDO LEFT VATS  . Thoracotomy Left 05/03/2013    Procedure: THORACOTOMY MAJOR;  Surgeon: Loreli Slot, MD;  Location: [facility] OR;  Service: Thoracic;  Laterality: Left;  . Lobectomy Left 05/03/2013    Procedure: LOBECTOMY;  Surgeon: Loreli Slot, MD;  Location: [facility] OR;  Service: Thoracic;  Laterality: Left;  . Wedge resection Left 05/03/2013    Procedure: WEDGE RESECTION;  Surgeon: Loreli Slot, MD;  Location: [facility] OR;  Service: Thoracic;  Laterality: Left;    Medications: Prior to Admission medications   Medication Sig Start Date End Date Taking? Authorizing Provider  albuterol (PROVENTIL HFA;VENTOLIN HFA) 108 (90 BASE) MCG/ACT inhaler Inhale 1-2 puffs into the lungs every 4 (four) hours as needed for wheezing.   Yes Historical Provider, MD  ALPRAZolam (XANAX) 0.25 MG tablet Take 1 tablet (0.25 mg total) by mouth at bedtime as needed for sleep. 06/21/13  Yes Si Gaul, MD  aspirin EC 81 MG tablet Take 81 mg by mouth every morning.   Yes Historical Provider, MD  dexamethasone (DECADRON) 4 MG tablet 4 Milligram by mouth twice a day the day before, day of and day after the chemotherapy  every three-week 06/14/13  Yes Si Gaul, MD  folic acid (FOLVITE) 1 MG tablet Take 1 mg by mouth every morning.   Yes Historical Provider, MD  gabapentin (NEURONTIN) 100 MG capsule Take 1 capsule (100 mg total) by mouth 3 (three) times daily. 06/28/13  Yes Si Gaul, MD  HYDROcodone-acetaminophen [facility]) 10-325 MG per tablet Take 1 tablet by mouth every  6 (six) hours as needed for pain. 06/21/13  Yes Loreli Slot, MD  lisinopril-hydrochlorothiazide (PRINZIDE,ZESTORETIC) 20-25 MG per tablet Take 1 tablet by mouth every morning.  05/20/13  Yes Historical Provider, MD  pravastatin (PRAVACHOL) 40 MG tablet Take 40 mg by mouth at bedtime.    Yes Historical Provider, MD  PRESCRIPTION MEDICATION He receives his treatments at the [facility] and his oncologist is Dr. Arbutus Ped. He is currently receiving Alimta and Cisplatin every 3 weeks. He received his first dose on 06/28/13.   Yes Historical Provider, MD  prochlorperazine (COMPAZINE) 10 MG tablet Take 10 mg by mouth every 6 (six) hours as needed (For nausea.).   Yes Historical Provider, MD    Allergies:  No Known Allergies  Social History:  reports that he quit smoking about 1 months ago. His smoking use included Cigarettes. He smoked 0.00 packs per day. He has quit using smokeless tobacco. His smokeless tobacco use included Chew. He reports that  drinks alcohol. He reports that he does not use illicit drugs.  Family History: Family History  Problem Relation Age of Onset  . Cancer - Other Mother     Physical Exam: Filed Vitals:   07/01/13 1449 07/01/13 1454 07/01/13 1708 07/01/13 1818  BP: 103/63  134/63 172/81  Pulse: 64  55 55  Temp: 98.3 F (36.8 C)   98.7 F (37.1 C)  TempSrc:    Oral  Resp: 18   16  SpO2: 100% 100% 100% 96%    General:  Alert and oriented times three, well developed and nourished, no acute distress Eyes: PERRLA, pink conjunctiva, no scleral icterus ENT: Moist oral mucosa, neck supple, no thyromegaly Lungs: clear to ascultation, no wheeze, no crackles, no use of accessory muscles Cardiovascular: regular rate and rhythm, no regurgitation, no gallops, no murmurs. No carotid bruits, no JVD Abdomen: soft, positive BS, non-tender, non-distended, no organomegaly, not an acute abdomen GU: not examined Neuro: CN II - XII grossly intact, sensation  intact Musculoskeletal: strength 5/5 all extremities except right upper extremity strength not assessed, no clubbing, cyanosis or edema Skin: no rash, no subcutaneous crepitation, no decubitus Psych: appropriate patient   Labs on Admission:   Recent Labs  07/01/13 1610  NA 130*  K 3.5  CL 94*  CO2 28  GLUCOSE 86  BUN 26*  CREATININE 1.01  CALCIUM 8.5    Recent Labs  07/01/13 1610  AST 26  ALT 37  ALKPHOS 51  BILITOT 0.5  PROT 6.2  ALBUMIN 3.1*   No results found for this basename: LIPASE, AMYLASE,  in the last 72 hours  Recent Labs  07/01/13 1610  WBC 12.0*  NEUTROABS 8.9*  HGB 12.7*  HCT 37.9*  MCV 81.5  PLT 253   No results found for this basename: CKTOTAL, CKMB, CKMBINDEX, TROPONINI,  in the last 72 hours No components found with this basename: POCBNP,  No results found for this basename: DDIMER,  in the last 72 hours No results found for this basename: HGBA1C,  in the last 72 hours No results found for this basename: CHOL, HDL,  LDLCALC, TRIG, CHOLHDL, LDLDIRECT,  in the last 72 hours No results found for this basename: TSH, T4TOTAL, FREET3, T3FREE, THYROIDAB,  in the last 72 hours No results found for this basename: VITAMINB12, FOLATE, FERRITIN, TIBC, IRON, RETICCTPCT,  in the last 72 hours  Micro Results: Results for ALDYN, TOON (MRN [id number]) as of 07/01/2013 19:28  Ref. Range 07/01/2013 16:37  Color, Urine Latest Range: YELLOW  YELLOW  APPearance Latest Range: CLEAR  CLEAR  Specific Gravity, Urine Latest Range: 1.005-1.030  1.021  pH Latest Range: 5.0-8.0  5.0  Glucose Latest Range: NEGATIVE mg/dL NEGATIVE  Bilirubin Urine Latest Range: NEGATIVE  NEGATIVE  Ketones, ur Latest Range: NEGATIVE mg/dL NEGATIVE  Protein Latest Range: NEGATIVE mg/dL NEGATIVE  Urobilinogen, UA Latest Range: 0.0-1.0 mg/dL 0.2  Nitrite Latest Range: NEGATIVE  NEGATIVE  Leukocytes, UA Latest Range: NEGATIVE  NEGATIVE  Hgb urine dipstick Latest Range: NEGATIVE  NEGATIVE      Radiological Exams on Admission: Dg Chest 2 View  07/01/2013   *RADIOLOGY REPORT*  Clinical Data: Recent fall, history of lung carcinoma  CHEST - 2 VIEW  Comparison: Chest x-ray of 05/25/2013  Findings: Volume loss of the left hemithorax is stable.  Pleural parenchymal opacity is also are noted both at the left lung base and overlying the left suprahilar region and aortic knob most likely postsurgical. However, follow-up is recommended to exclude possible recurrence.  The right lung is clear.  Mild cardiomegaly is stable.  No right pleural effusion is seen.  IMPRESSION: Persistent pleural and parenchymal opacities throughout the left hemithorax with volume loss most likely postoperative in nature. There is some irregularity overlying the left suprahilar region most likely scarring but continued follow-up is recommended to exclude recurrence of tumor.   Original Report Authenticated By: Dwyane Dee, M.D.   Dg Tibia/fibula Right  07/01/2013   *RADIOLOGY REPORT*  Clinical Data: Fall.  Pain.  RIGHT TIBIA AND FIBULA - 2 VIEW  Comparison: Knee and ankle films same date.  Findings: Spiral fracture of the mid to distal tibia with separation of fracture fragments by one shaft width.  Fracture of the proximal fibula/fibular head with foreshortening of the fracture fragments and mild angulation.  Knee joint degenerative changes.  IMPRESSION: Spiral fracture of the mid to distal tibia with separation of fracture fragments by one shaft width.  Fracture of the proximal fibula/fibular head with foreshortening of the fracture fragments and mild angulation.   Original Report Authenticated By: Lacy Duverney, M.D.   Dg Ankle Complete Right  07/01/2013   *RADIOLOGY REPORT*  Clinical Data: Trauma.  Lung cancer.  Pain.  RIGHT ANKLE - COMPLETE 3+ VIEW  Comparison: Right tibia - fibula films same date.  Findings: Spiral fracture of the distal tibia with separation of fracture fragments.  No ankle fracture or dislocation.   Plantar spur.  IMPRESSION: Spiral fracture of the distal tibia with separation of fracture fragments.  No ankle fracture or dislocation.  Plantar spur.   Original Report Authenticated By: Lacy Duverney, M.D.   Dg Knee Complete 4 Views Right  07/01/2013   *RADIOLOGY REPORT*  Clinical Data: Right knee pain after fall  RIGHT KNEE - COMPLETE 4+ VIEW  Comparison: None.  Findings: Moderately angulated and displaced fracture is seen involving the proximal fibula. Moderate to severe narrowing of medial joint space is noted with associated osteophyte formation and tibial spurring.  No joint effusion is noted.  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  Moderately angulated and displaced proximal fibular fracture.  Original Report Authenticated By: Lupita Raider.,  M.D.    EKG: Normal sinus rhythm  Assessment/Plan Present on Admission:  Right tib-fib fracture: Admit med telemetry. Orthopedics Dr. Jillyn Hidden is aware, the patient may be taken to OR this evening. The patient is medically cleared, he had a recent surgery which he tolerated well. N.p.o., pain medications as needed. Leukocytosis: No evidence of infection. May be due to patient's Decadron, which will be continued Hyponatremia: May be due to patient's hydrochlorothiazide, this has not been discontinued. IV fluids ordered repeat BMP in a.m. COPD: DuoNeb's when necessary Lung cancer: On chemotherapy, consult Dr. Shirline Frees if needed in a.m. Alcohol use: Patient denies withdrawal symptoms, however, CIWA protocol ordered Hypertension Dyslipidemia: Stable home medications    CODE STATUS: Full code  DVT prophylaxis ordered   Enzio Buchler 07/01/2013, 7:03 PM

## 2013-07-01 NOTE — Telephone Encounter (Signed)
Pt's wife called stating that pt fell at home and appears to have broken his leg.  She wants to know if they need to do anything particular since he just had chemo the other day.  EMS in on the way to bring him to the ED.  Per Dr Donnald Garre, no further orders given, Called and left a msg on wife's vm.  SLJ

## 2013-07-01 NOTE — Consult Note (Signed)
Reason for Consult: Right Tib Fib fracture Referring Physician: EDP  [name] [name] is an 57 y.o. male.  HPI: Larey Seat this AM syncopal event. Workup negative  Past Medical History  Diagnosis Date  . Hypercholesteremia     takes Pravastatin nightly  . Lung cancer   . HTN (hypertension)     takes Lisinopril and HCTZ daily  . Shortness of breath     with exertion;Albuterol inhaler prn  . Insomnia     takes Xanax prn    Past Surgical History  Procedure Laterality Date  . Left video-assisted thoracoscopy, mini thoracotomy; wedge, posterior segment of left upper lobe; wedge, anterior segment of left lower lobe with node dissection.  09/02/11    BURNEY  . Video assisted thoracoscopy Left 05/03/2013    Procedure: REDO VIDEO ASSISTED THORACOSCOPY;  Surgeon: Loreli Slot, MD;  Location: [facility] OR;  Service: Thoracic;  Laterality: Left;  REDO LEFT VATS  . Thoracotomy Left 05/03/2013    Procedure: THORACOTOMY MAJOR;  Surgeon: Loreli Slot, MD;  Location: [facility] OR;  Service: Thoracic;  Laterality: Left;  . Lobectomy Left 05/03/2013    Procedure: LOBECTOMY;  Surgeon: Loreli Slot, MD;  Location: [facility] OR;  Service: Thoracic;  Laterality: Left;  . Wedge resection Left 05/03/2013    Procedure: WEDGE RESECTION;  Surgeon: Loreli Slot, MD;  Location: [facility] OR;  Service: Thoracic;  Laterality: Left;    History reviewed. No pertinent family history.  Social History:  reports that he quit smoking about 1 months ago. His smoking use included Cigarettes. He smoked 0.00 packs per day. He has quit using smokeless tobacco. His smokeless tobacco use included Chew. He reports that  drinks alcohol. He reports that he does not use illicit drugs.  Allergies: No Known Allergies  Medications: I have reviewed the patient's current medications.  Results for orders placed during the hospital encounter of 07/01/13 (from the past 48 hour(s))  CBC WITH DIFFERENTIAL     Status: Abnormal   Collection  Time    07/01/13  4:10 PM      Result Value Range   WBC 12.0 (*) 4.0 - 10.5 K/uL   RBC 4.65  4.22 - 5.81 MIL/uL   Hemoglobin 12.7 (*) 13.0 - 17.0 g/dL   HCT 16.1 (*) 09.6 - 04.5 %   MCV 81.5  78.0 - 100.0 fL   MCH 27.3  26.0 - 34.0 pg   MCHC 33.5  30.0 - 36.0 g/dL   RDW 40.9  81.1 - 91.4 %   Platelets 253  150 - 400 K/uL   Neutrophils Relative % 75  43 - 77 %   Neutro Abs 8.9 (*) 1.7 - 7.7 K/uL   Lymphocytes Relative 22  12 - 46 %   Lymphs Abs 2.7  0.7 - 4.0 K/uL   Monocytes Relative 3  3 - 12 %   Monocytes Absolute 0.4  0.1 - 1.0 K/uL   Eosinophils Relative 0  0 - 5 %   Eosinophils Absolute 0.0  0.0 - 0.7 K/uL   Basophils Relative 0  0 - 1 %   Basophils Absolute 0.0  0.0 - 0.1 K/uL  COMPREHENSIVE METABOLIC PANEL     Status: Abnormal   Collection Time    07/01/13  4:10 PM      Result Value Range   Sodium 130 (*) 135 - 145 mEq/L   Potassium 3.5  3.5 - 5.1 mEq/L   Chloride 94 (*) 96 - 112 mEq/L  CO2 28  19 - 32 mEq/L   Glucose, Bld 86  70 - 99 mg/dL   BUN 26 (*) 6 - 23 mg/dL   Creatinine, Ser 1.61  0.50 - 1.35 mg/dL   Calcium 8.5  8.4 - 09.6 mg/dL   Total Protein 6.2  6.0 - 8.3 g/dL   Albumin 3.1 (*) 3.5 - 5.2 g/dL   AST 26  0 - 37 U/L   ALT 37  0 - 53 U/L   Alkaline Phosphatase 51  39 - 117 U/L   Total Bilirubin 0.5  0.3 - 1.2 mg/dL   GFR calc non Af Amer 81 (*) >90 mL/min   GFR calc Af Amer >90  >90 mL/min   Comment: (NOTE)     The eGFR has been calculated using the CKD EPI equation.     This calculation has not been validated in all clinical situations.     eGFR's persistently <90 mL/min signify possible Chronic Kidney     Disease.  POCT I-STAT TROPONIN I     Status: None   Collection Time    07/01/13  4:17 PM      Result Value Range   Troponin i, poc 0.00  0.00 - 0.08 ng/mL   Comment 3            Comment: Due to the release kinetics of cTnI,     a negative result within the first hours     of the onset of symptoms does not rule out     myocardial infarction  with certainty.     If myocardial infarction is still suspected,     repeat the test at appropriate intervals.  GLUCOSE, CAPILLARY     Status: None   Collection Time    07/01/13  4:21 PM      Result Value Range   Glucose-Capillary 84  70 - 99 mg/dL  URINALYSIS, ROUTINE W REFLEX MICROSCOPIC     Status: None   Collection Time    07/01/13  4:37 PM      Result Value Range   Color, Urine YELLOW  YELLOW   APPearance CLEAR  CLEAR   Specific Gravity, Urine 1.021  1.005 - 1.030   pH 5.0  5.0 - 8.0   Glucose, UA NEGATIVE  NEGATIVE mg/dL   Hgb urine dipstick NEGATIVE  NEGATIVE   Bilirubin Urine NEGATIVE  NEGATIVE   Ketones, ur NEGATIVE  NEGATIVE mg/dL   Protein, ur NEGATIVE  NEGATIVE mg/dL   Urobilinogen, UA 0.2  0.0 - 1.0 mg/dL   Nitrite NEGATIVE  NEGATIVE   Leukocytes, UA NEGATIVE  NEGATIVE   Comment: MICROSCOPIC NOT DONE ON URINES WITH NEGATIVE PROTEIN, BLOOD, LEUKOCYTES, NITRITE, OR GLUCOSE <1000 mg/dL.    Dg Chest 2 View  07/01/2013   *RADIOLOGY REPORT*  Clinical Data: Recent fall, history of lung carcinoma  CHEST - 2 VIEW  Comparison: Chest x-ray of 05/25/2013  Findings: Volume loss of the left hemithorax is stable.  Pleural parenchymal opacity is also are noted both at the left lung base and overlying the left suprahilar region and aortic knob most likely postsurgical. However, follow-up is recommended to exclude possible recurrence.  The right lung is clear.  Mild cardiomegaly is stable.  No right pleural effusion is seen.  IMPRESSION: Persistent pleural and parenchymal opacities throughout the left hemithorax with volume loss most likely postoperative in nature. There is some irregularity overlying the left suprahilar region most likely scarring but continued follow-up is recommended to  exclude recurrence of tumor.   Original Report Authenticated By: Dwyane Dee, M.D.   Dg Tibia/fibula Right  07/01/2013   *RADIOLOGY REPORT*  Clinical Data: Fall.  Pain.  RIGHT TIBIA AND FIBULA - 2 VIEW   Comparison: Knee and ankle films same date.  Findings: Spiral fracture of the mid to distal tibia with separation of fracture fragments by one shaft width.  Fracture of the proximal fibula/fibular head with foreshortening of the fracture fragments and mild angulation.  Knee joint degenerative changes.  IMPRESSION: Spiral fracture of the mid to distal tibia with separation of fracture fragments by one shaft width.  Fracture of the proximal fibula/fibular head with foreshortening of the fracture fragments and mild angulation.   Original Report Authenticated By: Lacy Duverney, M.D.   Dg Ankle Complete Right  07/01/2013   *RADIOLOGY REPORT*  Clinical Data: Trauma.  Lung cancer.  Pain.  RIGHT ANKLE - COMPLETE 3+ VIEW  Comparison: Right tibia - fibula films same date.  Findings: Spiral fracture of the distal tibia with separation of fracture fragments.  No ankle fracture or dislocation.  Plantar spur.  IMPRESSION: Spiral fracture of the distal tibia with separation of fracture fragments.  No ankle fracture or dislocation.  Plantar spur.   Original Report Authenticated By: Lacy Duverney, M.D.   Dg Knee Complete 4 Views Right  07/01/2013   *RADIOLOGY REPORT*  Clinical Data: Right knee pain after fall  RIGHT KNEE - COMPLETE 4+ VIEW  Comparison: None.  Findings: Moderately angulated and displaced fracture is seen involving the proximal fibula. Moderate to severe narrowing of medial joint space is noted with associated osteophyte formation and tibial spurring.  No joint effusion is noted.  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  Moderately angulated and displaced proximal fibular fracture.   Original Report Authenticated By: Lupita Raider.,  M.D.    Review of Systems  Musculoskeletal: Positive for joint pain.  All other systems reviewed and are negative.   Blood pressure 172/81, pulse 55, temperature 98.7 F (37.1 C), temperature source Oral, resp. rate 16, SpO2 96.00%. Physical Exam  Vitals  reviewed. Constitutional: He is oriented to person, place, and time. He appears well-developed.  HENT:  Head: Normocephalic.  Eyes: Pupils are equal, round, and reactive to light.  Neck: Normal range of motion.  Cardiovascular: Normal rate.   Respiratory: Effort normal.  GI: Soft.  Musculoskeletal:  Right leg deformity mild. Skin intact. NVI. No DVT. Compartments soft.  Neurological: He is alert and oriented to person, place, and time.  Skin: Skin is warm and dry.  Psychiatric: He has a normal mood and affect.    Assessment/Plan: Closed right Tib-Fib fracture spiral. Plan IM nailing. Risks discussed  Domonique Brouillard C 07/01/2013, 6:38 PM

## 2013-07-01 NOTE — Brief Op Note (Signed)
07/01/2013  9:51 PM  PATIENT:  [name] [name]  57 y.o. male  PRE-OPERATIVE DIAGNOSIS:  Right Fib/Tib Fracture  POST-OPERATIVE DIAGNOSIS:  right tibial fracture  PROCEDURE:  Procedure(s): OPEN REDUCTION INTERNAL FIXATION TIBIAL  FRACTURE (Right)  SURGEON:  Surgeon(s) and Role:    * Javier Docker, MD - Primary  PHYSICIAN ASSISTANT:   ASSISTANTS: Bissell   ANESTHESIA:   general  EBL:  Total I/O In: 1000 [I.V.:1000] Out: -   BLOOD ADMINISTERED:none  DRAINS: none   LOCAL MEDICATIONS USED:  NONE  SPECIMEN:  No Specimen  DISPOSITION OF SPECIMEN:  N/A  COUNTS:  YES  TOURNIQUET:  * No tourniquets in log *  DICTATION: .Other Dictation: Dictation Number [id number]  PLAN OF CARE: Admit to inpatient   PATIENT DISPOSITION:  PACU - hemodynamically stable.   Delay start of Pharmacological VTE agent (>24hrs) due to surgical blood loss or risk of bleeding: no

## 2013-07-01 NOTE — ED Notes (Signed)
Per pt he was at home sitting outside with a friend. He attempted to stand up when he did he became dizziness, lost his balance and proceeded to fall to the ground. He denies any LOC at this time. He does state that he hurt his RLE. Pt has a hx of lung CA and recently had his LLL removed  05/03/2013 and had his very first round of chemo on Monday.  Dr. Jerolyn Center is his oncologist.

## 2013-07-01 NOTE — Anesthesia Procedure Notes (Signed)
Spinal  Patient location during procedure: OR Start time: 07/01/2013 8:12 PM End time: 07/01/2013 8:17 PM Staffing Anesthesiologist: Lucille Passy F Performed by: anesthesiologist  Preanesthetic Checklist Completed: patient identified, site marked, surgical consent, pre-op evaluation, timeout performed, IV checked, risks and benefits discussed and monitors and equipment checked Spinal Block Patient position: sitting Prep: Betadine Patient monitoring: heart rate, continuous pulse ox and blood pressure Approach: midline Location: L2-3 Injection technique: single-shot Needle Needle type: Spinocan  Needle gauge: 22 G Needle length: 9 cm Additional Notes Expiration date of kit checked and confirmed. Patient tolerated procedure well, without complications. Negative heme/paresthesia [name] [name] [id number] DOE 04/2014

## 2013-07-01 NOTE — Anesthesia Preprocedure Evaluation (Signed)
Anesthesia Evaluation  Patient identified by MRN, date of birth, ID band Patient awake    Reviewed: Allergy & Precautions, H&P , NPO status , Patient's Chart, lab work & pertinent test results  Airway Mallampati: II TM Distance: >3 FB Neck ROM: Full    Dental  (+) Edentulous Upper and Edentulous Lower   Pulmonary shortness of breath and with exertion, former smoker,  Hx of lung CA; chemotherapy and wedge resection of lung.   Pulmonary exam normal + decreased breath sounds      Cardiovascular hypertension, Pt. on medications Rhythm:Regular Rate:Normal     Neuro/Psych negative neurological ROS  negative psych ROS   GI/Hepatic negative GI ROS, Neg liver ROS,   Endo/Other  negative endocrine ROS  Renal/GU negative Renal ROS  negative genitourinary   Musculoskeletal negative musculoskeletal ROS (+)   Abdominal   Peds negative pediatric ROS (+)  Hematology negative hematology ROS (+)   Anesthesia Other Findings   Reproductive/Obstetrics negative OB ROS                           Anesthesia Physical Anesthesia Plan  ASA: III and emergent  Anesthesia Plan: Spinal   Post-op Pain Management:    Induction: Intravenous  Airway Management Planned: Simple Face Mask  Additional Equipment:   Intra-op Plan:   Post-operative Plan:   Informed Consent: I have reviewed the patients History and Physical, chart, labs and discussed the procedure including the risks, benefits and alternatives for the proposed anesthesia with the patient or authorized representative who has indicated his/her understanding and acceptance.   Dental advisory given  Plan Discussed with: CRNA  Anesthesia Plan Comments:         Anesthesia Quick Evaluation

## 2013-07-01 NOTE — Interval H&P Note (Signed)
History and Physical Interval Note:  07/01/2013 7:51 PM  [name] [name]  has presented today for surgery, with the diagnosis of Right Fib/Tib Fracture  The various methods of treatment have been discussed with the patient and family. After consideration of risks, benefits and other options for treatment, the patient has consented to  Procedure(s): OPEN REDUCTION INTERNAL FIXATION (ORIF) TIBIA/FIBULA FRACTURE (Right) as a surgical intervention .  The patient's history has been reviewed, patient examined, no change in status, stable for surgery.  I have reviewed the patient's chart and labs.  Questions were answered to the patient's satisfaction.     Nielle Duford C

## 2013-07-01 NOTE — ED Provider Notes (Signed)
TIME SEEN: 2:53 PM  CHIEF COMPLAINT: Near syncope, right lower extremity injury  HPI:  Patient is a 57 year old male with a history of lung cancer diagnosed in 2012 status post 2 resections who recently had chemotherapy on Monday, 3 days ago, who presents emergency department after 2 near syncopal event with standing today that resulted in a right lower extremity injury. Patient reports that he been on the sun with his friend he was cleaning his pool today and only drank approximately half a bottle of Gatorade. He reports he stood up quickly and felt lightheaded and fell to the ground. He got back up to get his care aide and with standing again he fell down. Denies a syncopal event. Denies hitting his head. On the second fall he injured his right lower extremity and has been unable to angulate since. Denies any chest pain, shortness of breath, palpitations, neurologic deficits receiving her after the fall. No recent vomiting or diarrhea. No bloody stools or melena. No fever or cough. No dysuria or hematuria. Patient states he did take his blood pressure medication this morning. He has been on blood pressure medicine for the past 2 months.  ROS: See HPI Constitutional: no fever  Eyes: no drainage  ENT: no runny nose   Cardiovascular:  no chest pain  Resp: no SOB  GI: no vomiting GU: no dysuria Integumentary: no rash  Allergy: no hives  Musculoskeletal: no leg swelling  Neurological: no slurred speech ROS otherwise negative  PAST MEDICAL HISTORY/PAST SURGICAL HISTORY:  Past Medical History  Diagnosis Date  . Hypercholesteremia     takes Pravastatin nightly  . Lung cancer   . HTN (hypertension)     takes Lisinopril and HCTZ daily  . Shortness of breath     with exertion;Albuterol inhaler prn  . Insomnia     takes Xanax prn    MEDICATIONS:  Prior to Admission medications   Medication Sig Start Date End Date Taking? Authorizing Provider  albuterol (PROVENTIL HFA;VENTOLIN HFA) 108 (90  BASE) MCG/ACT inhaler Inhale 1-2 puffs into the lungs every 4 (four) hours as needed for wheezing.    Historical Provider, MD  ALPRAZolam Prudy Feeler) 0.25 MG tablet Take 1 tablet (0.25 mg total) by mouth at bedtime as needed for sleep. 06/21/13   Si Gaul, MD  dexamethasone (DECADRON) 4 MG tablet 4 Milligram by mouth twice a day the day before, day of and day after the chemotherapy every three-week 06/14/13   Si Gaul, MD  gabapentin (NEURONTIN) 100 MG capsule Take 1 capsule (100 mg total) by mouth 3 (three) times daily. 06/28/13   Si Gaul, MD  HYDROcodone-acetaminophen [facility]) 10-325 MG per tablet Take 1 tablet by mouth every 6 (six) hours as needed for pain. 06/21/13   Loreli Slot, MD  lisinopril-hydrochlorothiazide (PRINZIDE,ZESTORETIC) 20-25 MG per tablet  05/20/13   Historical Provider, MD  pravastatin (PRAVACHOL) 40 MG tablet Take 40 mg by mouth daily.    Historical Provider, MD  prochlorperazine (COMPAZINE) 10 MG tablet Take 1 tablet (10 mg total) by mouth every 6 (six) hours as needed. 06/14/13   Si Gaul, MD    ALLERGIES:  No Known Allergies  SOCIAL HISTORY:  History  Substance Use Topics  . Smoking status: Former Smoker    Types: Cigarettes    Quit date: 05/02/2013  . Smokeless tobacco: Not on file     Comment: has gone from 2pks a day down to 7-8 cigs in a 2wk period  . Alcohol Use: 0.0  oz/week     Comment: daily beer    FAMILY HISTORY: History reviewed. No pertinent family history.  EXAM: BP 103/63  Pulse 64  Temp(Src) 98.3 F (36.8 C)  Resp 18  SpO2 100% CONSTITUTIONAL: Alert and oriented and responds appropriately to questions. Well-appearing; well-nourished HEAD: Normocephalic EYES: Conjunctivae clear, PERRL ENT: normal nose; no rhinorrhea; dry mucous membranes; pharynx without lesions noted NECK: Supple, no meningismus, no LAD  CARD: RRR; S1 and S2 appreciated; no murmurs, no clicks, no rubs, no gallops RESP: Normal chest excursion  without splinting or tachypnea; breath sounds clear and equal bilaterally; no wheezes, no rhonchi, no rales,  ABD/GI: Normal bowel sounds; non-distended; soft, non-tender, no rebound, no guarding BACK:  The back appears normal and is non-tender to palpation, there is no CVA tenderness EXT: Deformity to the mid right lower extremity likely involving the tibia-fibula, tenderness to palpation over the fibular head, tenderness palpation over the lateral and medial malleolus on the right ankle, 2+ DP pulses bilaterally, sensation to light touch intact diffusely, otherwise Normal ROM in all joints; non-tender to palpation; no edema; normal capillary refill; no cyanosis; compartments soft    SKIN: Normal color for age and race; warm NEURO: Moves all extremities equally, cranial nerves II through XII intact, sensation to light touch intact diffusely PSYCH: The patient's mood and manner are appropriate. Grooming and personal hygiene are appropriate.  MEDICAL DECISION MAKING: Patient with near-syncopal episode with standing. No other preceding symptoms. No cardiac history. He has a history of cancer is currently started chemotherapy has had one dose 3 days ago. He states he has been feeling well recently. On his last near-syncopal event he injured his right lower extremity appears to have a deformity of his right fibula and tibia. Neurovascular intact distally. We'll obtain cardiac labs, basic labs, EKG, urinalysis. Will give IV fluids. Will x-ray his lower extremity. Suspect his near syncopal event or secondary to dehydration in the setting of decreased by mouth intake and being in the heat today. We'll give IV fluids.  ED PROGRESS:    Date: 07/01/2013 4:17 PM  Rate: 55  Rhythm: normal sinus rhythm  QRS Axis: normal  Intervals: normal  ST/T Wave abnormalities: normal  Conduction Disutrbances: none  Narrative Interpretation: unremarkable; no ischemic changes   6:22 PM  the pressure is improved with IV  fluids. He is hemodynamically stable. His medical workup is unremarkable. Patient does have mild leukocytosis but this may be due to be on dexamethasone versus an acute stress reaction. He does have a proximal right fibular head fracture with angulation and shortening as well as a spiral fracture to the midshaft tibia with displacement and shortening. Discussed with Dr. Jillyn Hidden with [facility] orthopedics who will see in the ED. They may need to take patient to surgery tonight. Will keep n.p.o. Will discuss with hospitalist for admission.  Layla Maw Delvecchio Madole, DO 07/01/13 [id number]

## 2013-07-01 NOTE — ED Notes (Signed)
Per EMS patient with Hx of cancer and chemotherapy this week was sitting outside drinking gatorade, then stood up, felt some dizziness, and fell down, felt a pop in his leg when he fell and c/o right lower leg pain. EMS states they saw no visible deformities and that patient has good pedal pulse. EMS administered 8 mg of morphine IV in route and applied foam leg splint. Per EMS patient denies hitting head, denies neck pain, back pain.

## 2013-07-01 NOTE — Transfer of Care (Signed)
Immediate Anesthesia Transfer of Care Note  Patient: [name] [name]  Procedure(s) Performed: Procedure(s): OPEN REDUCTION INTERNAL FIXATION TIBIAL  FRACTURE (Right)  Patient Location: PACU  Anesthesia Type:Regional  Level of Consciousness: awake, alert , oriented and patient cooperative  Airway & Oxygen Therapy: Patient Spontanous Breathing and Patient connected to face mask oxygen  Post-op Assessment: Report given to PACU RN and Post -op Vital signs reviewed and stable  Post vital signs: stable  Complications: No apparent anesthesia complications

## 2013-07-01 NOTE — ED Notes (Signed)
Bed: WA22 Expected date:  Expected time:  Means of arrival:  Comments: ems 

## 2013-07-02 ENCOUNTER — Ambulatory Visit (HOSPITAL_COMMUNITY): Payer: BC Managed Care – PPO

## 2013-07-02 ENCOUNTER — Encounter (HOSPITAL_COMMUNITY): Payer: Self-pay | Admitting: Radiology

## 2013-07-02 ENCOUNTER — Encounter: Payer: Self-pay | Admitting: Internal Medicine

## 2013-07-02 DIAGNOSIS — S82409A Unspecified fracture of shaft of unspecified fibula, initial encounter for closed fracture: Secondary | ICD-10-CM

## 2013-07-02 DIAGNOSIS — I10 Essential (primary) hypertension: Secondary | ICD-10-CM

## 2013-07-02 DIAGNOSIS — C3430 Malignant neoplasm of lower lobe, unspecified bronchus or lung: Secondary | ICD-10-CM

## 2013-07-02 LAB — COMPREHENSIVE METABOLIC PANEL
ALT: 34 U/L (ref 0–53)
Alkaline Phosphatase: 51 U/L (ref 39–117)
CO2: 26 mEq/L (ref 19–32)
GFR calc Af Amer: >90 mL/min (ref >90)
Glucose, Bld: 119 mg/dL — ABNORMAL HIGH (ref 70–99)
Potassium: 3.8 mEq/L (ref 3.5–5.1)
Sodium: 130 mEq/L — ABNORMAL LOW (ref 135–145)
Total Protein: 5.8 g/dL — ABNORMAL LOW (ref 6.0–8.3)

## 2013-07-02 LAB — CBC
MCH: 27 pg (ref 26.0–34.0)
MCHC: 33 g/dL (ref 30.0–36.0)
Platelets: 260 10*3/uL (ref 150–400)

## 2013-07-02 MED ORDER — ENOXAPARIN SODIUM 40 MG/0.4ML ~~LOC~~ SOLN: 40.0000 mg | SUBCUTANEOUS | Status: DC

## 2013-07-02 MED ORDER — ALBUTEROL SULFATE (5 MG/ML) 0.5% IN NEBU
5.0000 mg | INHALATION_SOLUTION | Freq: Four times a day (QID) | RESPIRATORY_TRACT | Status: DC | PRN
  Administered 2013-07-03 – 2013-07-04 (×2): 5 mg via RESPIRATORY_TRACT
  Filled 2013-07-02 (×2): qty 1

## 2013-07-02 MED ORDER — IOHEXOL 300 MG/ML  SOLN
100.0000 mL | Freq: Once | INTRAMUSCULAR | Status: AC | PRN
  Administered 2013-07-02: 100 mL via INTRAVENOUS

## 2013-07-02 MED ORDER — OXYCODONE-ACETAMINOPHEN 5-325 MG PO TABS
1.0000 | ORAL_TABLET | ORAL | Status: DC | PRN
  Administered 2013-07-02 – 2013-07-03 (×8): 2 via ORAL
  Administered 2013-07-03: 1 via ORAL
  Administered 2013-07-04 – 2013-07-05 (×6): 2 via ORAL
  Administered 2013-07-05: 1 via ORAL
  Administered 2013-07-05 (×2): 2 via ORAL
  Administered 2013-07-05: 1 via ORAL
  Filled 2013-07-02 (×18): qty 2

## 2013-07-02 MED ORDER — HYDROMORPHONE HCL PF 1 MG/ML IJ SOLN
1.0000 mg | INTRAMUSCULAR | Status: DC | PRN
  Administered 2013-07-02 – 2013-07-05 (×13): 2 mg via INTRAVENOUS
  Filled 2013-07-02 (×13): qty 2

## 2013-07-02 MED ORDER — ALBUTEROL SULFATE (5 MG/ML) 0.5% IN NEBU: 5.0000 mg | INHALATION_SOLUTION | Freq: Once | RESPIRATORY_TRACT | Status: DC

## 2013-07-02 NOTE — Progress Notes (Signed)
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  2) stage IIB (T3, N1, M0) non-small cell lung cancer, adenocarcinoma diagnosed in June of 2014.   PRIOR THERAPY:  1) Status post 3 cycles of systemic chemotherapy with carboplatin and etoposide. Last dose was given on 07/16/2011.  2) Left video-assisted thoracoscopy, mini thoracotomy; wedge, posterior segment of left upper lobe; wedge, anterior segment of left lower lobe with node dissection.  3) Redo left video-assisted thoracoscopy, lysis of adhesions, wedge resection of left lower lobe, thoracoscopic left lower lobectomy under the care of Dr. Dorris Fetch on 05/03/2013.   CURRENT THERAPY: Adjuvant chemotherapy with cisplatin 75 mg/M2 and Alimta 500 mg/M2 every 3 weeks, first dose 06/28/2013.   CHEMOTHERAPY INTENT: Adjuvant/Curative.  CURRENT # OF CHEMOTHERAPY CYCLES: 1  CURRENT ANTIEMETICS: N/A  CURRENT SMOKING STATUS: Quit smoking 05/02/13  ORAL CHEMOTHERAPY AND CONSENT: None  CURRENT BISPHOSPHONATES USE: None  PAIN MANAGEMENT: 2/10  NARCOTICS INDUCED CONSTIPATION: None  LIVING WILL AND CODE STATUS: Full Code   Subjective: The patient is seen and examined today. His wife was at the bedside. He was admitted yesterday with dizziness and fall. X-ray of the right tibial and fibular showed a spiral fracture of the mid to distal tibia with the patient and her fracture fragments by one shaft width. There was also fracture of the proximal fibula/fibular head with foreshortening of the fracture segments and made ambulation. He underwent Closed reduction, intramedullary nailing of the  right tibiam under the care of Dr. Shelle Iron. Stress radiographs under x-ray and anesthesia of the knee and of the ankle. The patient is feeling better today. He denied having any significant fever or chills. Has no nausea or vomiting. No significant chest pain or shortness of breath.     Objective: Vital signs in last 24 hours: Temp:  [97.6 F (36.4 C)-98.7 F (37.1 C)] 98.1 F (36.7 C) (08/22 0433) Pulse Rate:  [55-72] 70 (08/22 0433) Resp:  [11-19] 18 (08/22 0433) BP: (98-172)/(49-81) 132/78 mmHg (08/22 0433) SpO2:  [96 %-100 %] 99 % (08/22 0433) Weight:  [257 lb 15 oz (117 kg)] 257 lb 15 oz (117 kg) (08/22 0433)  Intake/Output from previous day: 08/21 0701 - 08/22 0700 In: 1850 [I.V.:1800; IV Piggyback:50] Out: 2425 [Urine:2275; Blood:150] Intake/Output this shift:    General appearance: alert, cooperative, fatigued and no distress Resp: clear to auscultation bilaterally Cardio: regular rate and rhythm, S1, S2 normal, no murmur, click, rub or gallop GI: soft, non-tender; bowel sounds normal; no masses,  no organomegaly Extremities: Cast on the left leg.  Lab Results:   Recent Labs  07/01/13 1610 07/02/13 0455  WBC 12.0* 12.0*  HGB 12.7* 11.6*  HCT 37.9* 35.1*  PLT 253 260   BMET  Recent Labs  07/01/13 1610 07/02/13 0455  NA 130* 130*  K 3.5 3.8  CL 94* 96  CO2 28 26  GLUCOSE 86 119*  BUN 26* 21  CREATININE 1.01 0.84  CALCIUM 8.5 8.1*    Studies/Results: Dg Chest 2 View  07/01/2013   *RADIOLOGY REPORT*  Clinical Data: Recent fall, history of lung carcinoma  CHEST - 2 VIEW  Comparison: Chest x-ray of 05/25/2013  Findings: Volume loss of the left hemithorax is stable.  Pleural parenchymal opacity is also are noted both at the left lung base and overlying the left suprahilar region and aortic knob most likely postsurgical. However, follow-up is recommended to exclude possible recurrence.  The right lung is clear.  Mild cardiomegaly is stable.  No right pleural effusion is seen.  IMPRESSION: Persistent pleural and parenchymal opacities throughout the left hemithorax with volume loss most likely postoperative in nature. There is some irregularity overlying the left suprahilar region most likely scarring but continued follow-up is recommended  to exclude recurrence of tumor.   Original Report Authenticated By: Dwyane Dee, M.D.   Dg Tibia/fibula Right  07/01/2013   *RADIOLOGY REPORT*  RIGHT TIBIA AND FIBULA - 2 VIEW  Comparison: Prior radiograph performed earlier on the same day at 16 45.  Findings: Six intraoperative spot fluoroscopic images of the right tibia are provided for interpretation.  Sequelae of reduction internal fixation of previously identified spiral fracture of the right tibia is visualized.  The intramedullary rod is well-centered within the intramedullary canal of the tibia.  Interlocking screws are in place and are intact. The identified fracture has been approximated, with the tibia no gross anatomic alignment.  No new fracture identified.  IMPRESSION: Fluoroscopic images of ORIF of spiral fracture of the right tibia without complication.   Original Report Authenticated By: Rise Mu, M.D.   Dg Tibia/fibula Right  07/01/2013   *RADIOLOGY REPORT*  Clinical Data: Fall.  Pain.  RIGHT TIBIA AND FIBULA - 2 VIEW  Comparison: Knee and ankle films same date.  Findings: Spiral fracture of the mid to distal tibia with separation of fracture fragments by one shaft width.  Fracture of the proximal fibula/fibular head with foreshortening of the fracture fragments and mild angulation.  Knee joint degenerative changes.  IMPRESSION: Spiral fracture of the mid to distal tibia with separation of fracture fragments by one shaft width.  Fracture of the proximal fibula/fibular head with foreshortening of the fracture fragments and mild angulation.   Original Report Authenticated By: Lacy Duverney, M.D.   Dg Ankle Complete Right  07/01/2013   *RADIOLOGY REPORT*  Clinical Data: Trauma.  Lung cancer.  Pain.  RIGHT ANKLE - COMPLETE 3+ VIEW  Comparison: Right tibia - fibula films same date.  Findings: Spiral fracture of the distal tibia with separation of fracture fragments.  No ankle fracture or dislocation.  Plantar spur.  IMPRESSION: Spiral  fracture of the distal tibia with separation of fracture fragments.  No ankle fracture or dislocation.  Plantar spur.   Original Report Authenticated By: Lacy Duverney, M.D.   Dg Knee Complete 4 Views Right  07/01/2013   *RADIOLOGY REPORT*  Clinical Data: Right knee pain after fall  RIGHT KNEE - COMPLETE 4+ VIEW  Comparison: None.  Findings: Moderately angulated and displaced fracture is seen involving the proximal fibula. Moderate to severe narrowing of medial joint space is noted with associated osteophyte formation and tibial spurring.  No joint effusion is noted.  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  Moderately angulated and displaced proximal fibular fracture.   Original Report Authenticated By: Lupita Raider.,  M.D.   Dg C-arm [id number] Min-no Report  07/01/2013   CLINICAL DATA: right tibia/fibula fracture   C-ARM 61-120 MINUTES  Fluoroscopy was utilized by the requesting physician.  No radiographic  interpretation.     Medications: I have reviewed the patient's current medications.  CODE STATUS: Full code  Assessment/Plan: This is a very pleasant 57 years old white male with recurrent non-small cell lung cancer status post resection and currently undergoing adjuvant chemotherapy with cisplatin and Alimta status post 1 cycle. The patient is tolerating his chemotherapy fairly well with no significant adverse effects. He has a  right tibia and fibula fracture after fall and dizzy spell. I would recommend for the patient to have CT of the head to rule out any brain metastasis. His last MRI of the brain was 2 years ago. If there is any suspicious brain metastases please call radiation oncology, Dr. Kathrynn Running. Continue current care for the recent fracture. The patient will have followup appointment with me after discharge as previously scheduled. Thank you for taking good care of Mr. Dugar. Please call if you have any questions.    LOS: 1 day    Titiana Severa  K. 07/02/2013

## 2013-07-02 NOTE — Op Note (Signed)
[name] [name], [name] [name]                 ACCOUNT NO.:  [id number]  MEDICAL RECORD NO.:  [id number]  LOCATION:  1406                         FACILITY:  [facility]  PHYSICIAN:  Jene Every, M.D.    DATE OF BIRTH:  08-24-1956  DATE OF PROCEDURE: DATE OF DISCHARGE:                              OPERATIVE REPORT   PREOPERATIVE DIAGNOSIS:  Closed, right tib-fib fracture.  POSTOPERATIVE DIAGNOSIS:  Closed, right tib-fib fracture.  PROCEDURE PERFORMED:  Closed reduction, intramedullary nailing of the right tibia.  Stress radiographs under x-ray and anesthesia of the knee and of the ankle.  ANESTHESIA:  General.  ASSISTANT:  Lanna Poche, PA.  BRIEF HISTORY:  This is a 58 year old, fell sustaining a closed fracture of the tibia, displaced, unstable indicated for intramedullary rodding for stabilization promoting healing and mobilization of the patient. Risk and benefits discussed including bleeding, infection, damage to neurovascular structures, DVT, PE, anesthetic complications, etc.  The patient had a history of lung CA.  No evidence of a pathologic lesion.  TECHNIQUE:  The patient in supine position, after induction of spinal anesthesia, 3 g Kefzol, the right lower extremity was prepped and draped in usual sterile fashion.  An x-ray was utilized to __________ turning point in the proximal tibia.  I made a small incision in the inferior pole of the patella.  Subcutaneous tissue was dissected, electrocautery utilized to achieve hemostasis.  I placed the tip of the ball over the center of the tibial canal which was just on the medial side of the patellar tendon.  Just below the articular surface approximately 2 cm, I entered the canal with an awl, medialized in it taking care not to protect posteriorly, entered the canal, reduced the fracture utilizing the triangle, advanced the ball guide down over the fracture site into the distal tibial physis.  I then measured in the AP and lateral  plane, was a 37.5.  We then sequentially reamed without any difficulty, a 12 mm diameter slight cortical contact.  This was then irrigated.  We selected a [employer] 11 x 37.5.  This was then advanced with the knee flexed, skin protected across the fracture site, but then reduced into the distal tibial physis.  The guidewire was then removed.  The external alignment guide was utilized and we placed 2 oblique screws proximally, right and left with x-ray at right angles to this making small incisions proximally drilling to the far cortex of the tibia carefully and then inserting the appropriate length screw at the depth gauge measurement. We used a 5.5 screws proximally, a 50 mm and a 75 mm.  Excellent purchase was noted in reduction of the fracture.  In addition, we placed 1 medial to lateral screw after using x-ray in the distal locking screw, we made a small incision medially, blunt dissection down to the tibia, centered on the x-ray, drilled medial to lateral, measured the screw 4.5 was utilized using 4.5 screw and 42 mm length bicortical excellent purchase was obtained in the AP and lateral plane, there was excellent anatomic reduction of the tibia.  Copiously irrigated all the wounds. We then examined under anesthesia the distal ankle and the syndesmosis, full range  without widening of the syndesmosis and under the knee, varus valgus strain at 0 and 30 degrees.  No instability.  No recurvatum.  No anterior drawer.  Copiously irrigated the wounds.  We repaired the patellar arthrotomy with 1 Vicryl, subcu with 2-0 Vicryl.  Skin was reapproximated with staples both proximally and distally.  Wound was dressed sterilely.  Placed a knee immobilizer.  Then secured with an Ace.  The patient was then transported to recovery room in satisfactory condition.  The patient tolerated the procedure well.  No complications.  Assistant, Lanna Poche, PA was utilized throughout the case  holding, assist in drilling, and reduction of the fracture while the rod was placed. Minimal blood loss, approximately 25 mL.     Jene Every, M.D.     Cordelia Pen  D:  07/01/2013  T:  07/02/2013  Job:  [id number]

## 2013-07-02 NOTE — Progress Notes (Signed)
On assessment, moderate amount of blood noted on pillow. Immobilizer opened and lower part of ACE wrap was saturated with blood. CN came into room and also looked at amount of blood. Good pulse and full sensation and movement. BP/VS stable. Will make MD aware on rounding. Will continue to monitor. Julio Sicks RN

## 2013-07-02 NOTE — Progress Notes (Signed)
Subjective: 1 Day Post-Op Procedure(s) (LRB): OPEN REDUCTION INTERNAL FIXATION TIBIAL  FRACTURE (Right) Patient reports pain as moderate.  C/o pain from knee through lower leg to ankle. Denies numbness or tingling. Pain meds wearing off after an hour is wondering if dose can be increased- receiving morphine and dilaudid.  Objective: Vital signs in last 24 hours: Temp:  [97.6 F (36.4 C)-98.7 F (37.1 C)] 98.1 F (36.7 C) (08/22 0433) Pulse Rate:  [55-72] 70 (08/22 0433) Resp:  [11-19] 18 (08/22 0433) BP: (98-172)/(49-81) 132/78 mmHg (08/22 0433) SpO2:  [96 %-100 %] 99 % (08/22 0433) Weight:  [117 kg (257 lb 15 oz)] 117 kg (257 lb 15 oz) (08/22 0433)  Intake/Output from previous day: 08/21 0701 - 08/22 0700 In: 1850 [I.V.:1800; IV Piggyback:50] Out: 2425 [Urine:2275; Blood:150] Intake/Output this shift:     Recent Labs  07/01/13 1610 07/02/13 0455  HGB 12.7* 11.6*    Recent Labs  07/01/13 1610 07/02/13 0455  WBC 12.0* 12.0*  RBC 4.65 4.29  HCT 37.9* 35.1*  PLT 253 260    Recent Labs  07/01/13 1610 07/02/13 0455  NA 130* 130*  K 3.5 3.8  CL 94* 96  CO2 28 26  BUN 26* 21  CREATININE 1.01 0.84  GLUCOSE 86 119*  CALCIUM 8.5 8.1*    Recent Labs  07/01/13 1900  INR 0.92    Neurologically intact ABD soft Neurovascular intact Sensation intact distally Intact pulses distally Dorsiflexion/Plantar flexion intact Incision: moderate bloody drainage on dressing distal aspect, no active bleeding No cellulitis present Compartment soft no calf pain  Assessment/Plan: 1 Day Post-Op Procedure(s) (LRB): OPEN REDUCTION INTERNAL FIXATION TIBIAL  FRACTURE (Right) Advance diet Up with therapy D/C IV fluids TTWB RLE Knee immobilizer RLE Will discuss splint/ASO for R ankle with Dr. Shelle Iron Changed distal dressing will recheck later today   BISSELL, JACLYN M. 07/02/2013, 8:45 AM

## 2013-07-02 NOTE — Progress Notes (Signed)
PT Cancellation Note  Patient Details Name: FENIX RORKE MRN: [id number] DOB: 1956/02/19   Cancelled Treatment:    Reason Eval/Treat Not Completed: Pain limiting ability to participate   Donnetta Hail 07/02/2013, 9:28 AM

## 2013-07-02 NOTE — Evaluation (Signed)
Occupational Therapy Evaluation Patient Details Name: [name] [name] MRN: [id number] DOB: 12-Nov-1955 Today's Date: 07/02/2013 Time: 4098-1191 OT Time Calculation (min): 24 min  OT Assessment / Plan / Recommendation History of present illness This is a 57 year old gentleman with a diagnosis of lung cancer recurrent. He had a pneumonectomy in June 2014 and has had his first round of chemotherapy Monday.  he was at the pool by a friend, he states he stood became lightheaded and fell. He was placed in a chair, later got up to get a drink, became lightheaded again and fell, this time he twisted his ankle and was unable to stand   Clinical Impression   Pt is s/p ORIF of R spiral fx of tib/fib.      OT Assessment  Patient needs continued OT Services    Follow Up Recommendations  No OT follow up;Supervision/Assistance - 24 hour    Barriers to Discharge      Equipment Recommendations  3 in 1 bedside comode    Recommendations for Other Services    Frequency  Min 2X/week    Precautions / Restrictions Precautions Precautions: Fall Restrictions Weight Bearing Restrictions: Yes RLE Weight Bearing: Touchdown weight bearing   Pertinent Vitals/Pain Premedicated:  Pain minimal in RLE with movement    ADL  Grooming: Set up Where Assessed - Grooming: Unsupported sitting Upper Body Bathing: Set up Where Assessed - Upper Body Bathing: Unsupported sitting Lower Body Bathing: +2 Total assistance Lower Body Bathing: Patient Percentage: 60% Where Assessed - Lower Body Bathing: Supported sit to stand Upper Body Dressing: Minimal assistance (lines) Where Assessed - Upper Body Dressing: Supported sit to stand Lower Body Dressing: +2 Total assistance Lower Body Dressing: Patient Percentage: 20% Toilet Transfer: Simulated;+2 Total assistance Toilet Transfer: Patient Percentage: 80% Toilet Transfer Method: Surveyor, minerals:  (bed to chair) Toileting - Architect and  Hygiene: +2 Total assistance Toileting - Clothing Manipulation and Hygiene: Patient Percentage: 50% Where Assessed - Toileting Clothing Manipulation and Hygiene: Sit to stand from 3-in-1 or toilet Equipment Used: Rolling walker Transfers/Ambulation Related to ADLs: spt to chair, shifting heel, toe, heel, toe L foot ADL Comments: educated on long sitting for adls.  He will have to clarify with Dr Shelle Iron if he can cover LE and get into shower (may have hardware)  Pt will have 24/7 help    OT Diagnosis: Generalized weakness  OT Problem List: Decreased strength;Decreased activity tolerance;Decreased knowledge of use of DME or AE;Pain OT Treatment Interventions: Self-care/ADL training;DME and/or AE instruction;Patient/family education   OT Goals(Current goals can be found in the care plan section) Acute Rehab OT Goals OT Goal Formulation: With patient Time For Goal Achievement: 07/09/13 Potential to Achieve Goals: Good ADL Goals Pt Will Perform Lower Body Bathing: with min assist;sit to/from stand Pt Will Perform Lower Body Dressing: with mod assist;sit to/from stand Pt Will Transfer to Toilet: with min guard assist;stand pivot transfer;bedside commode Pt Will Perform Toileting - Clothing Manipulation and hygiene: with min guard assist;sitting/lateral leans;sit to/from stand Pt Will Perform Tub/Shower Transfer: Shower transfer (verbalize technique to step in, using rw and 3;1)  Visit Information  Last OT Received On: 07/02/13 Assistance Needed: +2 (safety)        Prior Functioning     Home Living Family/patient expects to be discharged to:: Private residence Living Arrangements: Spouse/significant other Available Help at Discharge: Family Type of Home: Mobile home Home Access: Stairs to enter (anticipating getting ramp ) Entrance Stairs-Number of Steps: 4 Entrance Stairs-Rails:  None Home Layout: One level Home Equipment: Cane - single point Prior Function Level of Independence:  Independent Comments: "Doing fine!" Communication Communication: No difficulties         Vision/Perception     Cognition  Cognition Arousal/Alertness: Awake/alert Behavior During Therapy: WFL for tasks assessed/performed Overall Cognitive Status: Within Functional Limits for tasks assessed    Extremity/Trunk Assessment Upper Extremity Assessment Upper Extremity Assessment: Overall WFL for tasks assessed Lower Extremity Assessment Lower Extremity Assessment: RLE deficits/detail RLE Deficits / Details: Pt with ace wrap on lower leg with KI in place.  Pt able to wiggle toes and has good sensation in them with no edema     Mobility Bed Mobility Bed Mobility: Supine to Sit Supine to Sit: 4: Min assist;With rails;HOB elevated Details for Bed Mobility Assistance: assist for RLE Transfers Transfers: Sit to Stand Sit to Stand: 1: +2 Total assist Sit to Stand: Patient Percentage: 80% (for safety) Stand to Sit: 3: Mod assist (assist to lift leg to assure NWB) Details for Transfer Assistance: +2 to ensure TDWB.  Pt maintained NWB     Exercise     Balance     End of Session OT - End of Session Activity Tolerance: Patient tolerated treatment well Patient left: in chair;with call bell/phone within reach  GO     [facility] 07/02/2013, 3:30 PM Marica Otter, OTR/L [phone] 07/02/2013

## 2013-07-02 NOTE — Evaluation (Signed)
Physical Therapy Evaluation Patient Details Name: [name] [name] MRN: [id number] DOB: [birth year] Today's Date: 07/02/2013 Time: 4098-1191 PT Time Calculation (min): 22 min  PT Assessment / Plan / Recommendation History of Present Illness  This is a 57 year old gentleman with a diagnosis of lung cancer recurrent. He had a pneumonectomy in June 2014 and has had his first round of chemotherapy Monday. Today he was at the pool by a friend, he states he was in the shade, he stood became lightheaded and fell. He was placed in a chair, later got up to get a drink, became lightheaded again and fell, this time he twisted his ankle and was unable to stand  Clinical Impression  Pt much better this afternoon and willing to get out of bed. KI maintained on knee, but there is not brace or protection on foot or ankle so NWB maintained.  Anticipate pt will have difficulty entering home due to several steps with no handrail.Marland Kitchen He may need a wide tall RW for short distance ambulation in his home and a w/c for getting out the th house to MD appt    PT Assessment  Patient needs continued PT services    Follow Up Recommendations  Home health PT    Does the patient have the potential to tolerate intense rehabilitation      Barriers to Discharge Inaccessible home environment;Other (comment) (no rail on 4or 5 steps )      Equipment Recommendations  Rolling walker with 5" wheels;Wheelchair (measurements PT) (tall, wide)    Recommendations for Other Services     Frequency Min 3X/week    Precautions / Restrictions Precautions Precautions: Fall Restrictions Weight Bearing Restrictions: Yes RLE Weight Bearing: Touchdown weight bearing   Pertinent Vitals/Pain Pain in right ankle      Mobility  Bed Mobility Bed Mobility: Supine to Sit Supine to Sit: 4: Min assist;With rails;HOB elevated Details for Bed Mobility Assistance: assist for RLE Transfers Transfers: Sit to Stand;Stand to Sit;Stand Pivot  Transfers Sit to Stand: 1: +2 Total assist Sit to Stand: Patient Percentage: 80% (for safety) Stand to Sit: 3: Mod assist (assist to lift leg to assure NWB) Stand Pivot Transfers: 1: +2 Total assist Stand Pivot Transfers: Patient Percentage: 80% (safety) Details for Transfer Assistance: +2 to ensure TDWB.  Pt maintained NWB Ambulation/Gait Ambulation/Gait Assistance: Not tested (comment) Assistive device: Rolling walker    Exercises     PT Diagnosis: Difficulty walking;Generalized weakness;Acute pain  PT Problem List: Pain;Decreased mobility;Decreased activity tolerance PT Treatment Interventions: DME instruction;Gait training;Stair training;Functional mobility training     PT Goals(Current goals can be found in the care plan section) Acute Rehab PT Goals Patient Stated Goal: to go home PT Goal Formulation: With patient Time For Goal Achievement: 07/16/13 Potential to Achieve Goals: Good  Visit Information  Last PT Received On: 07/02/13 Assistance Needed: +2 (safety) History of Present Illness: This is a 57 year old gentleman with a diagnosis of lung cancer recurrent. He had a pneumonectomy in June 2014 and has had his first round of chemotherapy Monday. Today he was at the pool by a friend, he states he was in the shade, he stood became lightheaded and fell. He was placed in a chair, later got up to get a drink, became lightheaded again and fell, this time he twisted his ankle and was unable to stand       Prior Functioning  Home Living Family/patient expects to be discharged to:: Private residence Living Arrangements: Spouse/significant other Available Help at Discharge:  Family Type of Home: Mobile home Home Access: Stairs to enter (anticipating getting ramp ) Entrance Stairs-Number of Steps: 4 Entrance Stairs-Rails: None Home Layout: One level Home Equipment: Cane - single point Prior Function Level of Independence: Independent Comments: "Doing  fine!" Communication Communication: No difficulties    Cognition  Cognition Arousal/Alertness: Awake/alert Behavior During Therapy: WFL for tasks assessed/performed Overall Cognitive Status: Within Functional Limits for tasks assessed    Extremity/Trunk Assessment Upper Extremity Assessment Upper Extremity Assessment: Overall WFL for tasks assessed Lower Extremity Assessment Lower Extremity Assessment: RLE deficits/detail RLE Deficits / Details: Pt with ace wrap on lower leg with KI in place.  Pt able to wiggle toes and has good sensation in them with no edema   Balance Balance Balance Assessed: Yes Static Sitting Balance Static Sitting - Balance Support: No upper extremity supported;Bilateral upper extremity supported Static Sitting - Level of Assistance: 6: Modified independent (Device/Increase time);7: Independent Static Standing Balance Static Standing - Balance Support: Bilateral upper extremity supported Static Standing - Level of Assistance: 6: Modified independent (Device/Increase time) Static Standing - Comment/# of Minutes: no dizziness noted  End of Session PT - End of Session Equipment Utilized During Treatment: Right knee immobilizer Activity Tolerance: Patient tolerated treatment well Patient left: in chair Nurse Communication: Mobility status  GP    Bayard Hugger. Manson Passey, [place] [phone] 07/02/2013, 3:36 PM

## 2013-07-02 NOTE — Progress Notes (Addendum)
Patient ID: WRAY GOEHRING, male   DOB: 1955-12-16, 57 y.o.   MRN: [id number] [facility] PROGRESS NOTE  SHASHANK KWASNIK WUJ:[id number] DOB: 1955/12/06 DOA: 07/01/2013 PCP: Ailene Ravel, MD  Brief narrative: Pt is 57 year old gentleman with a diagnosis of recurrent lung cancer on chemotherapy with cisplatin and Alimta every 3 weeks, first dose 06/28/2013, follows with Dr. Arbutus Ped, status post pneumonectomy in June 2014, presented to [facility] emergency department after sustaining episode of fall at home and unable to bear weight after the event. Patient reports feeling lightheadedness prior to fall but denies loss of consciousness, denies chest pain or shortness of breath, did not hit his head, denies palpitations. Patient denies history of any significant cardiac disease. He explains his shortness of breath is chronic and related to COPD and to pneumonectomies one in 2012 and one in 2014. Patient stopped smoking 2 months ago. He is not on home oxygen but uses nebulizers occasionally. In the emergency department, x-ray of tibia and fibula showed right spiral fracture of the mid to distal tibia with separation of fracture fragments.  Active Problems:   Tibia/fibula fracture, right - patient is status post open reduction internal fixation of tibial fracture on the right side postop day 1 - Doing well clinically and hemodynamically stable  - continue analgesia as needed for symptom control - attempt physical therapy today or tomorrow if patient able to tolerate    Lung cancer - Appreciate oncologist input - Patient will followup in outpatient setting with Dr. Arbutus Ped   Fall - Likely mechanical but we need to rule out brain metastases - Will proceed with CT head for further evaluation - continue Decadron    Leukocytosis  - Mild and likely secondary to stress fracture and emargination  - No clear sign of an infectious etiology  - No need for antibiotics at this time, will obtain CBC in the  morning   Consultants:  Orthopedics  Oncology   Procedures/Studies: Dg Chest 2 View   07/01/2013  Persistent pleural, parenchymal opacities throughout L hemithorax, volume loss likely postop. Irregularity overlying left suprahilar region likely scarring but continued follow-up recommended to exclude recurrence of tumor.  Dg Tibia/fibula Right  07/01/2013   Fluoroscopic images of ORIF of spiral fracture of the right tibia without complication.    Dg Tibia/fibula Right 07/01/2013  Spiral fracture of the mid to distal tibia with separation of fracture fragments by one shaft width.  Fracture of the proximal fibula/fibular head with foreshortening of the fracture fragments and mild angulation.    Dg Ankle Complete Right  07/01/2013  Spiral fracture of the distal tibia with separation of fracture fragments.  No ankle fracture or dislocation.  Plantar spur.    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  Moderately angulated and displaced proximal fibular fracture.     Antibiotics:  None  Code Status: Full Family Communication: Pt and daughter at bedside Disposition Plan: Home when medically stable  HPI/Subjective: No events overnight.   Objective: Filed Vitals:   07/02/13 0205 07/02/13 0303 07/02/13 0433 07/02/13 1006  BP: 125/75 137/76 132/78 130/79  Pulse: 67 68 70 66  Temp: 97.9 F (36.6 C) 98.1 F (36.7 C) 98.1 F (36.7 C) 98.2 F (36.8 C)  TempSrc: Oral Oral Oral Oral  Resp: 18 19 18 16   Height:      Weight:   117 kg (257 lb 15 oz)   SpO2: 100% 100% 99% 99%  Intake/Output Summary (Last 24 hours) at 07/02/13 1034 Last data filed at 07/02/13 0845  Gross per 24 hour  Intake   2330 ml  Output   3025 ml  Net   -695 ml    Exam:   General:  Pt is alert, follows commands appropriately, not in acute distress  Cardiovascular: Regular rate and rhythm, S1/S2, no murmurs, no rubs, no gallops  Respiratory: Clear to auscultation  bilaterally, no wheezing, no crackles, no rhonchi  Abdomen: Soft, non tender, non distended, bowel sounds present, no guarding  Extremities: No edema, pulses DP and PT palpable bilaterally, right leg in cast   Neuro: Grossly nonfocal  Data Reviewed: Basic Metabolic Panel:  Recent Labs Lab 06/28/13 0827 07/01/13 1610 07/02/13 0455  NA 139 130* 130*  K 4.3 3.5 3.8  CL  --  94* 96  CO2 23 28 26   GLUCOSE 214* 86 119*  BUN 13.1 26* 21  CREATININE 1.0 1.01 0.84  CALCIUM 9.5 8.5 8.1*   Liver Function Tests:  Recent Labs Lab 06/28/13 0827 07/01/13 1610 07/02/13 0455  AST 22 26 23   ALT 37 37 34  ALKPHOS 79 51 51  BILITOT 0.43 0.5 0.5  PROT 7.8 6.2 5.8*  ALBUMIN 3.7 3.1* 2.9*   CBC:  Recent Labs Lab 06/28/13 0827 07/01/13 1610 07/02/13 0455  WBC 13.0* 12.0* 12.0*  NEUTROABS 11.3* 8.9*  --   HGB 13.8 12.7* 11.6*  HCT 41.5 37.9* 35.1*  MCV 81.7 81.5 81.8  PLT 322 253 260   CBG:  Recent Labs Lab 07/01/13 1621  GLUCAP 84   Scheduled Meds: . dexamethasone  4 mg Oral Q12H  . docusate sodium  100 mg Oral BID  . enoxaparin  injection  40 mg Subcutaneous Daily  . folic acid  1 mg Oral q morning - 10a  . gabapentin  100 mg Oral TID  . lisinopril  20 mg Oral Daily  . hydrochlorothiazide  25 mg Oral Daily  . multivitamin   1 tablet Oral Daily  . thiamine  100 mg Oral Daily   Continuous Infusions: . 0.9 % NaCl with KCl 20 mEq / L 100 mL/hr at 07/02/13 1610   Debbora Presto, MD  [facility] Pager [phone]  If 7PM-7AM, please contact night-coverage [email or web address] Password [facility] 07/02/2013, 10:34 AM   LOS: 1 day

## 2013-07-02 NOTE — Progress Notes (Signed)
Nutrition Brief Note  Patient identified on the Malnutrition Screening Tool (MST) Report  Wt Readings from Last 15 Encounters:  07/02/13 257 lb 15 oz (117 kg)  07/02/13 257 lb 15 oz (117 kg)  06/28/13 246 lb 4.8 oz (111.721 kg)  06/14/13 244 lb 3.2 oz (110.768 kg)  05/25/13 242 lb (109.77 kg)  05/07/13 254 lb (115.214 kg)  05/07/13 254 lb (115.214 kg)  04/29/13 254 lb 9.6 oz (115.486 kg)  04/20/13 256 lb (116.121 kg)  03/30/13 256 lb (116.121 kg)  03/22/13 256 lb 9.6 oz (116.393 kg)  02/25/13 257 lb 4.8 oz (116.711 kg)  11/25/12 249 lb 4.8 oz (113.082 kg)  07/28/12 245 lb 1.6 oz (111.177 kg)  04/01/12 245 lb (111.131 kg)    Body mass index is 34.04 kg/(m^2). Patient meets criteria for Obesity based on current BMI.   Current diet order is Regular, patient is consuming approximately 100% of meals at this time. Pt states that his appetite is good and he was eating well PTA. Pt states his usual body weight is 255 lbs; he lost 20 lbs back in June but, has already regained. Pt denies any complications from chemotherapy.  Labs and medications reviewed.   No nutrition interventions warranted at this time. If nutrition issues arise, please consult RD.   Ian Malkin RD, LDN Inpatient Clinical Dietitian Pager: [phone] After Hours Pager: [phone]

## 2013-07-02 NOTE — Care Management Note (Addendum)
    Page 1 of 2   07/05/2013     12:29:39 PM   CARE MANAGEMENT NOTE 07/05/2013  Patient:  [name], [name] [name]   Account Number:  [id number]  Date Initiated:  07/02/2013  Documentation initiated by:  [name]  Subjective/Objective Assessment:   ADMITTED W/FALL.R TIB/FIB FX.EA:VWUJ CA     Action/Plan:   FROM HOME.   Anticipated DC Date:  07/05/2013   Anticipated DC Plan:  HOME W HOME HEALTH SERVICES  In-house referral  NA      DC Planning Services  CM consult      [name] [name]  HOME HEALTH   [name] offered to / List presented to:  C-1 Patient   DME arranged  3-N-1  WALKER - ROLLING  WHEELCHAIR - MANUAL      DME agency  [name]     HH arranged  HH-2 PT  HH-1 RN      [name] agency  [name]   Status of service:  Completed, signed off Medicare Important Message given?   (If response is "NO", [name] following Medicare IM given date fields will be blank) Date Medicare IM given:   Date Additional Medicare IM given:    Discharge Disposition:  HOME W HOME HEALTH SERVICES  Per UR Regulation:  Reviewed for med. necessity/level of care/duration of stay  If discussed at Long Length of Stay Meetings, dates discussed:    Comments:  07/05/13 [name] Elzey RN,BSN NCM [phone] D/C  HOME W/HH,DME,[name] AWARE OF D/C & HH/DME ORDERS.NO FURTHER D/C NEEDS.  07/04/13 ATIKAHALLRNCM [phone] Received referral for [name] RN and PT services. Patient to discharge home tomorrow. Offered [name] to patient and wife-- chose [facility]. Made referral to [name] and noted patient lives in [name]. [facility] states they are able to accept.Faxed orders to [name] for PT/RN services. Patient also discharging on home lovenox. Call to [name] pharmacy to find out copay for sq lovenox and to confirm they have it in stock-- [name] at [phone]-- spoke with [name] pharmacy staff who reports they can not give copay amount unless they run [name] prescription. Therefore faxed prescription for  lovenox to pharmacy at fax [phone]. Called and spoke with pharmacist who reports PATIENT HAS NO COPAY FOR LOVENOX. Patient made aware of copay amount. ATIKAHALLRNCM [phone]   07/02/13 [name] Gebel RN,BSN NCM [phone] S/P ORIF R TIB/FIB.PT/OT EVAL.

## 2013-07-03 LAB — CBC
MCH: 27 pg (ref 26.0–34.0)
MCHC: 33.4 g/dL (ref 30.0–36.0)
MCV: 80.7 fL (ref 78.0–100.0)
Platelets: 251 10*3/uL (ref 150–400)
RDW: 15.2 % (ref 11.5–15.5)
WBC: 12 10*3/uL — ABNORMAL HIGH (ref 4.0–10.5)

## 2013-07-03 LAB — BASIC METABOLIC PANEL
CO2: 31 mEq/L (ref 19–32)
Calcium: 9 mg/dL (ref 8.4–10.5)
Creatinine, Ser: 0.83 mg/dL (ref 0.50–1.35)

## 2013-07-03 NOTE — Progress Notes (Signed)
Subjective: 2 Days Post-Op Procedure(s) (LRB): OPEN REDUCTION INTERNAL FIXATION TIBIAL  FRACTURE (Right) Patient reports pain as 2 on 0-10 scale. Doing Fine. Good Dorsiflexion of Foot and circulation intact.  Objective: Vital signs in last 24 hours: Temp:  [97.7 F (36.5 C)-98.2 F (36.8 C)] 98.1 F (36.7 C) (08/23 0439) Pulse Rate:  [61-69] 61 (08/23 0439) Resp:  [16] 16 (08/23 0439) BP: (114-130)/(67-87) 126/86 mmHg (08/23 0439) SpO2:  [99 %-100 %] 99 % (08/23 0439)  Intake/Output from previous day: 08/22 0701 - 08/23 0700 In: 2350 [P.O.:1800; I.V.:550] Out: 5600 [Urine:5600] Intake/Output this shift:     Recent Labs  07/01/13 1610 07/02/13 0455 07/03/13 0506  HGB 12.7* 11.6* 11.9*    Recent Labs  07/02/13 0455 07/03/13 0506  WBC 12.0* 12.0*  RBC 4.29 4.41  HCT 35.1* 35.6*  PLT 260 251    Recent Labs  07/02/13 0455 07/03/13 0506  NA 130* 132*  K 3.8 4.5  CL 96 96  CO2 26 31  BUN 21 20  CREATININE 0.84 0.83  GLUCOSE 119* 134*  CALCIUM 8.1* 9.0    Recent Labs  07/01/13 1900  INR 0.92    Dorsiflexion/Plantar flexion intact  Assessment/Plan: 2 Days Post-Op Procedure(s) (LRB): OPEN REDUCTION INTERNAL FIXATION TIBIAL  FRACTURE (Right) Up with therapy  Janine Reller A 07/03/2013, 10:01 AM

## 2013-07-03 NOTE — Progress Notes (Signed)
Occupational Therapy Treatment Patient Details Name: [name] [name] MRN: [id number] DOB: 1956/04/24 Today's Date: 07/03/2013 Time: 4098-1191 OT Time Calculation (min): 26 min  OT Assessment / Plan / Recommendation  History of present illness R tib/fib fx, s/p ORIF   OT comments  Making steady progress in mobility.  Educated in use of AE, DME and in shower transfer maintaining TDWB status on R LE.  Pt is awaiting a ramp to be built tomorrow prior to return home.  Pt will have wife or 57 yr old daughter available to assist at home as needed.  Follow Up Recommendations  No OT follow up;Supervision/Assistance - 24 hour    Barriers to Discharge       Equipment Recommendations  3 in 1 bedside comode, manual w/c with cushion and elevating leg rest    Recommendations for Other Services    Frequency Min 2X/week   Progress towards OT Goals Progress towards OT goals: Progressing toward goals  Plan Discharge plan remains appropriate    Precautions / Restrictions Precautions Precautions: Fall Required Braces or Orthoses: Knee Immobilizer - Right Restrictions Weight Bearing Restrictions: Yes RLE Weight Bearing: Touchdown weight bearing   Pertinent Vitals/Pain 3/10 R LE, premedicated, repositioned    ADL  Lower Body Bathing: Moderate assistance Where Assessed - Lower Body Bathing: Supported sit to stand Lower Body Dressing: Moderate assistance Where Assessed - Lower Body Dressing: Unsupported sitting;Supported sit to stand Toilet Transfer: Minimal assistance Toilet Transfer Method: Stand pivot Toilet Transfer Equipment: Bedside commode Equipment Used: Gait belt;Rolling walker;Reacher;Long-handled sponge;Long-handled shoe horn;Sock aid;Knee Immobilizer Transfers/Ambulation Related to ADLs: min assist with RW ADL Comments: Educated pt and wife on AE for LB ADL.  Pt will borrow or purchase a reacher.  Will rely on family to assist with shoes and socks when necessary, but plans to use slip  on shoes or go barefoot and wear elastic shorts at home.  Instructed pt and wife in multiple uses of 3 in1.  Verbal instruction in A-P transfer to shower.  Pt declined practicing shower transfer, will defer to HHPT.    OT Diagnosis:    OT Problem List:   OT Treatment Interventions:     OT Goals(current goals can now be found in the care plan section) Acute Rehab OT Goals Patient Stated Goal: to stay here until his ramp is built and he gets to talk with Dr. Shelle Iron  Visit Information  Last OT Received On: 07/03/13 Assistance Needed: +1 History of Present Illness: R tib/fib fx, s/p ORIF    Subjective Data      Prior Functioning       Cognition  Cognition Arousal/Alertness: Awake/alert Behavior During Therapy: WFL for tasks assessed/performed Overall Cognitive Status: Within Functional Limits for tasks assessed    Mobility  Bed Mobility Bed Mobility: Not assessed Transfers Transfers: Sit to Stand;Stand to Sit Sit to Stand: 4: Min assist Stand to Sit: 4: Min assist    Exercises     Balance Balance Balance Assessed: Yes Static Sitting Balance Static Sitting - Balance Support: No upper extremity supported;Bilateral upper extremity supported Static Sitting - Level of Assistance: 7: Independent Static Standing Balance Static Standing - Balance Support: Bilateral upper extremity supported Static Standing - Level of Assistance: 5: Stand by assistance   End of Session OT - End of Session Activity Tolerance: Patient tolerated treatment well Patient left: in chair;with call bell/phone within reach;with family/visitor present  GO     Evern Bio 07/03/2013, 3:51 PM [phone]

## 2013-07-03 NOTE — Progress Notes (Addendum)
Patient ID: YOSHITO GAZA, male   DOB: 05-04-1956, 57 y.o.   MRN: [id number] [facility] PROGRESS NOTE  EASTYN DATTILO YQM:[id number] DOB: 1956-01-12 DOA: 07/01/2013 PCP: Ailene Ravel, MD  Brief narrative:  Pt is 57 year old gentleman with a diagnosis of recurrent lung cancer on chemotherapy with cisplatin and Alimta every 3 weeks, first dose 06/28/2013, follows with Dr. Arbutus Ped, status post pneumonectomy in June 2014, presented to [facility] long emergency department after sustaining episode of fall at home and unable to bear weight after the event. Patient reports feeling lightheadedness prior to fall but denies loss of consciousness, denies chest pain or shortness of breath, did not hit his head, denies palpitations. Patient denies history of any significant cardiac disease. He explains his shortness of breath is chronic and related to COPD and to pneumonectomies one in 2012 and one in 2014. Patient stopped smoking 2 months ago. He is not on home oxygen but uses nebulizers occasionally. In the emergency department, x-ray of tibia and fibula showed right spiral fracture of the mid to distal tibia with separation of fracture fragments.   Active Problems:  Tibia/fibula fracture, right - patient is status post open reduction internal fixation of tibial fracture on the right side postop day 2 - Doing well clinically and hemodynamically stable  - continue analgesia as needed for symptom control  - attempt physical therapy today Lung cancer  - Appreciate oncologist input  - Patient will followup in outpatient setting with Dr. Arbutus Ped  Fall  - Likely mechanical but we need to rule out brain metastases  - Ct head unremarkable and no signs of metastatic disease  - continue Decadron  Leukocytosis  - Mild and likely secondary to stress fracture and emargination  - No clear sign of an infectious etiology  - No need for antibiotics at this time  Consultants:  Orthopedics  Oncology   Procedures/Studies:  Dg Chest 2 View 07/01/2013 Persistent pleural, parenchymal opacities throughout L hemithorax, volume loss likely postop. Irregularity overlying left suprahilar region likely scarring but continued follow-up recommended to exclude recurrence of tumor.  Dg Tibia/fibula Right 07/01/2013 Fluoroscopic images of ORIF of spiral fracture of the right tibia without complication.  Dg Tibia/fibula Right 07/01/2013 Spiral fracture of the mid to distal tibia with separation of fracture fragments by one shaft width. Fracture of the proximal fibula/fibular head with foreshortening of the fracture fragments and mild angulation.  Dg Ankle Complete Right 07/01/2013 Spiral fracture of the distal tibia with separation of fracture fragments. No ankle fracture or dislocation. Plantar spur.  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 Moderately angulated and displaced proximal fibular fracture.  Antibiotics:  None  Code Status: Full  Family Communication: Pt and daughter at bedside  Disposition Plan: Home in AM  HPI/Subjective: No events overnight.   Objective: Filed Vitals:   07/02/13 1006 07/02/13 1344 07/02/13 2027 07/03/13 0439  BP: 130/79 114/87 126/67 126/86  Pulse: 66 64 69 61  Temp: 98.2 F (36.8 C) 97.7 F (36.5 C) 98.2 F (36.8 C) 98.1 F (36.7 C)  TempSrc: Oral Oral Oral Oral  Resp: 16 16 16 16   Height:      Weight:      SpO2: 99% 99% 100% 99%    Intake/Output Summary (Last 24 hours) at 07/03/13 0614 Last data filed at 07/02/13 1745  Gross per 24 hour  Intake   1870 ml  Output   3400 ml  Net  -1530 ml  Exam:   General:  Pt is alert, follows commands appropriately, not in acute distress  Cardiovascular: Regular rate and rhythm, S1/S2, no murmurs, no rubs, no gallops  Respiratory: Clear to auscultation bilaterally, no wheezing, no crackles, no rhonchi  Abdomen: Soft, non tender, non distended, bowel sounds present,  no guarding  Neuro: Grossly nonfocal  Data Reviewed: Basic Metabolic Panel:  Recent Labs Lab 06/28/13 0827 07/01/13 1610 07/02/13 0455  NA 139 130* 130*  K 4.3 3.5 3.8  CL  --  94* 96  CO2 23 28 26   GLUCOSE 214* 86 119*  BUN 13.1 26* 21  CREATININE 1.0 1.01 0.84  CALCIUM 9.5 8.5 8.1*   Liver Function Tests:  Recent Labs Lab 06/28/13 0827 07/01/13 1610 07/02/13 0455  AST 22 26 23   ALT 37 37 34  ALKPHOS 79 51 51  BILITOT 0.43 0.5 0.5  PROT 7.8 6.2 5.8*  ALBUMIN 3.7 3.1* 2.9*   No results found for this basename: LIPASE, AMYLASE,  in the last 168 hours No results found for this basename: AMMONIA,  in the last 168 hours CBC:  Recent Labs Lab 06/28/13 0827 07/01/13 1610 07/02/13 0455 07/03/13 0506  WBC 13.0* 12.0* 12.0* 12.0*  NEUTROABS 11.3* 8.9*  --   --   HGB 13.8 12.7* 11.6* 11.9*  HCT 41.5 37.9* 35.1* 35.6*  MCV 81.7 81.5 81.8 80.7  PLT 322 253 260 251   CBG:  Recent Labs Lab 07/01/13 1621  GLUCAP 84     Scheduled Meds: . dexamethasone  4 mg Oral Q12H  . docusate sodium  100 mg Oral BID  . folic acid  1 mg Oral q morning - 10a  . gabapentin  100 mg Oral TID  . lisinopril  20 mg Oral Daily   And  . hydrochlorothiazide  25 mg Oral Daily  . multivitamin with minerals  1 tablet Oral Daily  . sodium chloride  3 mL Intravenous Q12H  . thiamine  100 mg Oral Daily   Or  . thiamine  100 mg Intravenous Daily   Continuous Infusions:   Debbora Presto, MD  [facility] Pager [phone]  If 7PM-7AM, please contact night-coverage [email or web address] Password [facility] 07/03/2013, 6:14 AM   LOS: 2 days

## 2013-07-03 NOTE — Progress Notes (Signed)
Physical Therapy Treatment Patient Details Name: [name] [name] MRN: [id number] DOB: 02/13/1956 Today's Date: 07/03/2013 Time: 1478-2956 PT Time Calculation (min): 33 min  PT Assessment / Plan / Recommendation  History of Present Illness Pt continues to have pain in leg   PT Comments   Pt is improving in mobility, but is limited by TDWB status and pain.  He is having a ramp built at home to allow him to enter. Pt will benefit from continued PT  Follow Up Recommendations  Home health PT     Does the patient have the potential to tolerate intense rehabilitation     Barriers to Discharge        Equipment Recommendations  Rolling walker with 5" wheels;Wheelchair (measurements PT);3in1 (PT) (tall, wide RW, wide3in1 Pt requests std w/c elevating legres)    Recommendations for Other Services    Frequency Min 3X/week   Progress towards PT Goals Progress towards PT goals: Progressing toward goals  Plan Current plan remains appropriate    Precautions / Restrictions Precautions Precautions: Fall Required Braces or Orthoses: Knee Immobilizer - Right Restrictions Weight Bearing Restrictions: Yes RLE Weight Bearing: Touchdown weight bearing   Pertinent Vitals/Pain Pain in RLE.  Pt needed to have pain med during treatment    Mobility  Bed Mobility Bed Mobility: Supine to Sit Supine to Sit: 4: Min assist;HOB elevated Details for Bed Mobility Assistance: assist for RLE Transfers Transfers: Sit to Stand;Stand to Sit;Stand Pivot Transfers Sit to Stand: 4: Min assist Sit to Stand: Patient Percentage:  (for safety) Stand to Sit: 4: Min assist (assist to lift leg to assure NWB) Stand Pivot Transfers: 4: Min assist Stand Pivot Transfers: Patient Percentage:  (safety) Details for Transfer Assistance: Pt wants to be independent and do everything himself.  He uses arms to push up and is able to achieve balance with RW with NWB on LLE Ambulation/Gait Ambulation/Gait Assistance: 4: Min  guard Ambulation Distance (Feet): 10 Feet Assistive device: Rolling walker Ambulation/Gait Assistance Details: assist for safety and for IV Gait Pattern: Step-through pattern Gait velocity: impulsive when pt decides to walk General Gait Details: Pt ambivalent about walking at first, but then pushed up with arms and took large steps to walk around in room Stairs: No Wheelchair Mobility Wheelchair Mobility: No    Exercises General Exercises - Lower Extremity Ankle Circles/Pumps: AROM;Both (limited active range in right ankle.  no pain noted) Hip ABduction/ADduction: AAROM;Both;Sidelying Hip Flexion/Marching: AROM;Left;10 reps Other Exercises Other Exercises: standing isometrics. Other Exercises: deep breathing Other Exercises: step forward and back for weight shift and balance   PT Diagnosis:    PT Problem List:   PT Treatment Interventions:     PT Goals (current goals can now be found in the care plan section) Acute Rehab PT Goals Patient Stated Goal: to stay here until his ramp is built and he gets to talk with Dr. Shelle Iron PT Goal Formulation: With patient Time For Goal Achievement: 07/16/13 Potential to Achieve Goals: Good  Visit Information  Last PT Received On: 07/03/13 Assistance Needed: +1 History of Present Illness: Pt continues to have pain in leg    Subjective Data  Patient Stated Goal: to stay here until his ramp is built and he gets to talk with Dr. Lysle Rubens  Cognition Arousal/Alertness: Awake/alert Behavior During Therapy: [name] for tasks assessed/performed Overall Cognitive Status: Within Functional Limits for tasks assessed    Balance  Balance Balance Assessed: Yes Static Sitting Balance Static Sitting - Balance Support: No  upper extremity supported;Bilateral upper extremity supported Static Sitting - Level of Assistance: 7: Independent Static Standing Balance Static Standing - Balance Support: Bilateral upper extremity supported Static Standing  - Level of Assistance: 6: Modified independent (Device/Increase time)  End of Session PT - End of Session Equipment Utilized During Treatment: Right knee immobilizer Activity Tolerance: Patient tolerated treatment well Patient left: in chair Nurse Communication: Mobility status   GP    Bayard Hugger. Manson Passey, [place] [phone] 07/03/2013, 11:57 AM

## 2013-07-04 LAB — BASIC METABOLIC PANEL
Calcium: 9.1 mg/dL (ref 8.4–10.5)
Chloride: 96 mEq/L (ref 96–112)
Creatinine, Ser: 0.82 mg/dL (ref 0.50–1.35)
GFR calc Af Amer: >90 mL/min (ref >90)
GFR calc non Af Amer: >90 mL/min (ref >90)

## 2013-07-04 LAB — CBC
Platelets: 244 10*3/uL (ref 150–400)
RDW: 15.5 % (ref 11.5–15.5)
WBC: 9.7 10*3/uL (ref 4.0–10.5)

## 2013-07-04 MED ORDER — OXYCODONE-ACETAMINOPHEN 5-325 MG PO TABS: 1.0000 | ORAL_TABLET | ORAL | Status: DC | PRN

## 2013-07-04 MED ORDER — ZOLPIDEM TARTRATE 5 MG PO TABS: 5.0000 mg | ORAL_TABLET | Freq: Every evening | ORAL | Status: DC | PRN

## 2013-07-04 MED ORDER — ALPRAZOLAM 0.25 MG PO TABS: 0.2500 mg | ORAL_TABLET | Freq: Every evening | ORAL | Status: DC | PRN

## 2013-07-04 MED ORDER — ENOXAPARIN SODIUM 40 MG/0.4ML ~~LOC~~ SOLN: 40.0000 mg | SUBCUTANEOUS | Status: DC

## 2013-07-04 NOTE — Progress Notes (Signed)
Subjective: 3 Days Post-Op Procedure(s) (LRB): OPEN REDUCTION INTERNAL FIXATION TIBIAL  FRACTURE (Right) Patient reports pain as mild.  Pt has lots of questions about surgery, post op regimen, WB, ROM, etc.  No PT yet today.  Pain well controlled.  No f/c/n/v/wt loss.  Objective: Vital signs in last 24 hours: Temp:  [98.5 F (36.9 C)-98.7 F (37.1 C)] 98.7 F (37.1 C) (08/24 0535) Pulse Rate:  [66-77] 66 (08/24 0535) Resp:  [15-16] 16 (08/24 0535) BP: (115-132)/(70) 115/70 mmHg (08/24 0535) SpO2:  [98 %-100 %] 100 % (08/24 1141)  Intake/Output from previous day: 08/23 0701 - 08/24 0700 In: 960 [P.O.:960] Out: 1725 [Urine:1725] Intake/Output this shift: Total I/O In: 480 [P.O.:480] Out: -    Recent Labs  07/01/13 1610 07/02/13 0455 07/03/13 0506 07/04/13 0455  HGB 12.7* 11.6* 11.9* 11.6*    Recent Labs  07/03/13 0506 07/04/13 0455  WBC 12.0* 9.7  RBC 4.41 4.30  HCT 35.6* 35.1*  PLT 251 244    Recent Labs  07/03/13 0506 07/04/13 0455  NA 132* 134*  K 4.5 4.2  CL 96 96  CO2 31 32  BUN 20 20  CREATININE 0.83 0.82  GLUCOSE 134* 121*  CALCIUM 9.0 9.1    Recent Labs  07/01/13 1900  INR 0.92    PE:  R LE dressed and dry.  NVI at R LE.  Assessment/Plan: 3 Days Post-Op Procedure(s) (LRB): OPEN REDUCTION INTERNAL FIXATION TIBIAL  FRACTURE (Right) Up with therapy  NWB on R LE.   OK to remove immobilizer on R LE for ROM of knee.  Pt will likely go home tomorrow.  Toni Arthurs 07/04/2013, 12:39 PM

## 2013-07-04 NOTE — Progress Notes (Signed)
Patient ID: DEOVION BATREZ, male   DOB: 08/17/1956, 57 y.o.   MRN: [id number]  [facility] PROGRESS NOTE  MARICO BUCKLE WUJ:[id number] DOB: 1955/12/17 DOA: 07/01/2013 PCP: Ailene Ravel, MD  Brief narrative:  Pt is 57 year old gentleman with a diagnosis of recurrent lung cancer on chemotherapy with cisplatin and Alimta every 3 weeks, first dose 06/28/2013, follows with Dr. Arbutus Ped, status post pneumonectomy in June 2014, presented to [facility] long emergency department after sustaining episode of fall at home and unable to bear weight after the event. Patient reports feeling lightheadedness prior to fall but denies loss of consciousness, denies chest pain or shortness of breath, did not hit his head, denies palpitations. Patient denies history of any significant cardiac disease. He explains his shortness of breath is chronic and related to COPD and to pneumonectomies one in 2012 and one in 2014. Patient stopped smoking 2 months ago. He is not on home oxygen but uses nebulizers occasionally. In the emergency department, x-ray of tibia and fibula showed right spiral fracture of the mid to distal tibia with separation of fracture fragments.   Active Problems:  Tibia/fibula fracture, right - patient is status post open reduction internal fixation of tibial fracture on the right side postop day 3 - Doing well clinically and hemodynamically stable  - continue analgesia as needed for symptom control  - ambulate if pt tolerating, OOB to chair - set up home health PT Lung cancer  - Appreciate oncologist input  - Patient will followup in outpatient setting with Dr. Calton Dach  - CT head unremarkable and no signs of metastatic disease  - continue Decadron  Leukocytosis  - Mild and likely secondary to stress fracture and emargination  - WBC within normal limits this AM  Consultants:  Orthopedics  Oncology  Procedures/Studies:  Dg Chest 2 View 07/01/2013 Persistent pleural, parenchymal opacities  throughout L hemithorax, volume loss likely postop. Irregularity overlying left suprahilar region likely scarring but continued follow-up recommended to exclude recurrence of tumor.  Dg Tibia/fibula Right 07/01/2013 Fluoroscopic images of ORIF of spiral fracture of the right tibia without complication.  Dg Tibia/fibula Right 07/01/2013 Spiral fracture of the mid to distal tibia with separation of fracture fragments by one shaft width. Fracture of the proximal fibula/fibular head with foreshortening of the fracture fragments and mild angulation.  Dg Ankle Complete Right 07/01/2013 Spiral fracture of the distal tibia with separation of fracture fragments. No ankle fracture or dislocation. Plantar spur.  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 Moderately angulated and displaced proximal fibular fracture.  Antibiotics:  None  Code Status: Full  Family Communication: Pt and daughter at bedside  Disposition Plan: Home in AM   HPI/Subjective: No events overnight.   Objective: Filed Vitals:   07/03/13 1302 07/03/13 2135 07/04/13 0535 07/04/13 1141  BP: 123/70 132/70 115/70   Pulse: 68 77 66   Temp: 98.5 F (36.9 C) 98.5 F (36.9 C) 98.7 F (37.1 C)   TempSrc: Oral Oral Oral   Resp: 15 16 16    Height:      Weight:      SpO2: 99% 100% 98% 100%    Intake/Output Summary (Last 24 hours) at 07/04/13 1307 Last data filed at 07/04/13 0913  Gross per 24 hour  Intake    960 ml  Output   1725 ml  Net   -765 ml    Exam:   General:  Pt is alert, follows commands appropriately, not  in acute distress  Cardiovascular: Regular rate and rhythm, S1/S2, no murmurs, no rubs, no gallops  Respiratory: Clear to auscultation bilaterally, no wheezing, no crackles, no rhonchi  Abdomen: Soft, non tender, non distended, bowel sounds present, no guarding  Extremities: No edema, pulses DP and PT palpable bilaterally, right leg in immobilizer   Neuro: Grossly  nonfocal  Data Reviewed: Basic Metabolic Panel:  Recent Labs Lab 06/28/13 0827 07/01/13 1610 07/02/13 0455 07/03/13 0506 07/04/13 0455  NA 139 130* 130* 132* 134*  K 4.3 3.5 3.8 4.5 4.2  CL  --  94* 96 96 96  CO2 23 28 26 31  32  GLUCOSE 214* 86 119* 134* 121*  BUN 13.1 26* 21 20 20   CREATININE 1.0 1.01 0.84 0.83 0.82  CALCIUM 9.5 8.5 8.1* 9.0 9.1   Liver Function Tests:  Recent Labs Lab 06/28/13 0827 07/01/13 1610 07/02/13 0455  AST 22 26 23   ALT 37 37 34  ALKPHOS 79 51 51  BILITOT 0.43 0.5 0.5  PROT 7.8 6.2 5.8*  ALBUMIN 3.7 3.1* 2.9*   CBC:  Recent Labs Lab 06/28/13 0827 07/01/13 1610 07/02/13 0455 07/03/13 0506 07/04/13 0455  WBC 13.0* 12.0* 12.0* 12.0* 9.7  NEUTROABS 11.3* 8.9*  --   --   --   HGB 13.8 12.7* 11.6* 11.9* 11.6*  HCT 41.5 37.9* 35.1* 35.6* 35.1*  MCV 81.7 81.5 81.8 80.7 81.6  PLT 322 253 260 251 244   CBG:  Recent Labs Lab 07/01/13 1621  GLUCAP 84    Scheduled Meds: . dexamethasone  4 mg Oral Q12H  . docusate sodium  100 mg Oral BID  . folic acid  1 mg Oral q morning - 10a  . gabapentin  100 mg Oral TID  . lisinopril  20 mg Oral Daily   And  . hydrochlorothiazide  25 mg Oral Daily  . multivitamin with minerals  1 tablet Oral Daily  . sodium chloride  3 mL Intravenous Q12H  . thiamine  100 mg Oral Daily   Or  . thiamine  100 mg Intravenous Daily   Continuous Infusions:  Debbora Presto, MD  [facility] Pager [phone]  If 7PM-7AM, please contact night-coverage [email or web address] Password [email or web address] 07/04/2013, 1:07 PM   LOS: 3 days

## 2013-07-04 NOTE — Care Management (Signed)
   CARE MANAGEMENT NOTE 07/04/2013  Patient:  [name] [name], [name] [name]   Account Number:  [id number]  Date Initiated:  07/02/2013  Documentation initiated by:  [facility]  Subjective/Objective Assessment:   ADMITTED W/FALL.R TIB/FIB FX.ZO:[id number] CA     Action/Plan:   FROM HOME.   Anticipated DC Date:  07/05/2013   Anticipated DC Plan:  HOME W HOME HEALTH SERVICES  In-house referral  NA      DC Planning Services  CM consult      [facility] Choice  HOME HEALTH   Choice offered to / List presented to:  C-1 Patient   DME arranged  3-N-1  WALKER - ROLLING  WHEELCHAIR - MANUAL      DME agency  [facility]     HH arranged  HH-2 PT  HH-1 RN      [facility] agency  [facility]   Status of service:  In process, will continue to follow Medicare Important Message given?   (If response is "NO", the following Medicare IM given date fields will be blank) Date Medicare IM given:   Date Additional Medicare IM given:    Discharge Disposition:  HOME W HOME HEALTH SERVICES  Per UR Regulation:  Reviewed for med. necessity/level of care/duration of stay  If discussed at Long Length of Stay Meetings, dates discussed:    Comments:  07/04/13 ATIKAHALLRNCM [phone] Received referral for [facility] RN and PT services. Patient to discharge home tomorrow. Offered choice to patient and wife-- chose [facility]. Made referral to [facility] and noted patient lives in [place]. [facility] states they are able to accept.Faxed orders to [facility] for PT/RN services. Patient also discharging on home lovenox. Call to [facility] pharmacy to find out copay for sq lovenox and to confirm they have it in stock-- [facility] at [phone]-- spoke with [facility] pharmacy staff who reports they can not give copay amount unless they run the prescription. Therefore faxed prescription for lovenox to pharmacy at fax [phone]. Called and spoke with pharmacist who reports PATIENT HAS NO COPAY FOR LOVENOX. Patient made aware of copay amount being  zero amount. ATIKAHALLRNCM [phone]   07/02/13 KATHY MAHABIR RN,BSN NCM [phone] S/P ORIF R TIB/FIB.PT/OT EVAL.

## 2013-07-05 ENCOUNTER — Other Ambulatory Visit: Payer: BC Managed Care – PPO

## 2013-07-05 ENCOUNTER — Encounter (HOSPITAL_COMMUNITY): Payer: Self-pay | Admitting: Specialist

## 2013-07-05 MED ORDER — OXYCODONE-ACETAMINOPHEN 5-325 MG PO TABS: 1.0000 | ORAL_TABLET | ORAL | Status: DC | PRN

## 2013-07-05 MED ORDER — ALPRAZOLAM 0.25 MG PO TABS: 0.2500 mg | ORAL_TABLET | Freq: Every evening | ORAL | Status: DC | PRN

## 2013-07-05 MED ORDER — ENOXAPARIN SODIUM 40 MG/0.4ML ~~LOC~~ SOLN
40.0000 mg | SUBCUTANEOUS | Status: DC
  Administered 2013-07-05: 40 mg via SUBCUTANEOUS
  Filled 2013-07-05: qty 0.4

## 2013-07-05 MED ORDER — ZOLPIDEM TARTRATE 5 MG PO TABS: 5.0000 mg | ORAL_TABLET | Freq: Every evening | ORAL | Status: DC | PRN

## 2013-07-05 MED ORDER — ENOXAPARIN SODIUM 40 MG/0.4ML ~~LOC~~ SOLN: 40.0000 mg | SUBCUTANEOUS | Status: DC

## 2013-07-05 NOTE — Discharge Summary (Signed)
Physician Discharge Summary  [name] [name] WJX:[id number] DOB: May 12, 1956 DOA: 07/01/2013  PCP: Ailene Ravel, MD  Admit date: 07/01/2013 Discharge date: 07/05/2013  Recommendations for Outpatient Follow-up:  1. Pt will need to follow up with PCP in 2-3 weeks post discharge 2. Please obtain BMP to evaluate electrolytes and kidney function 3. Please also check CBC to evaluate Hg and Hct levels 4. Please note that pt was discharged on Lovenox 40 mg daily for DVT prophylaxis for 14 days per ortho recommendations   Discharge Diagnoses:  Active Problems:   Tibia/fibula fracture  Discharge Condition: Stable  Diet recommendation: Heart healthy diet discussed in details   Brief narrative:  Pt is 57 year old gentleman with a diagnosis of recurrent lung cancer on chemotherapy with cisplatin and Alimta every 3 weeks, first dose 06/28/2013, follows with Dr. Arbutus Ped, status post pneumonectomy in June 2014, presented to [facility] long emergency department after sustaining episode of fall at home and unable to bear weight after the event. Patient reports feeling lightheadedness prior to fall but denies loss of consciousness, denies chest pain or shortness of breath, did not hit his head, denies palpitations. Patient denies history of any significant cardiac disease. He explains his shortness of breath is chronic and related to COPD and to pneumonectomies one in 2012 and one in 2014. Patient stopped smoking 2 months ago. He is not on home oxygen but uses nebulizers occasionally. In the emergency department, x-ray of tibia and fibula showed right spiral fracture of the mid to distal tibia with separation of fracture fragments.   Active Problems:  Tibia/fibula fracture, right  - patient is status post open reduction internal fixation of tibial fracture on the right side postop day 4  - Doing well clinically and hemodynamically stable  - continue analgesia as needed for symptom control  - HHPT set up Lung  cancer  - Appreciate oncologist input  - Patient will followup in outpatient setting with Dr. Calton Dach  - CT head unremarkable and no signs of metastatic disease  - continue Decadron  Leukocytosis  - Mild and likely secondary to stress fracture and emargination  - WBC within normal limits   Consultants:  Orthopedics  Oncology  Procedures/Studies:  Dg Chest 2 View 07/01/2013 Persistent pleural, parenchymal opacities throughout L hemithorax, volume loss likely postop. Irregularity overlying left suprahilar region likely scarring but continued follow-up recommended to exclude recurrence of tumor.  Dg Tibia/fibula Right 07/01/2013 Fluoroscopic images of ORIF of spiral fracture of the right tibia without complication.  Dg Tibia/fibula Right 07/01/2013 Spiral fracture of the mid to distal tibia with separation of fracture fragments by one shaft width. Fracture of the proximal fibula/fibular head with foreshortening of the fracture fragments and mild angulation.  Dg Ankle Complete Right 07/01/2013 Spiral fracture of the distal tibia with separation of fracture fragments. No ankle fracture or dislocation. Plantar spur.  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 Moderately angulated and displaced proximal fibular fracture.  Antibiotics:  None  Code Status: Full  Family Communication: Pt and daughter at bedside   Discharge Exam: Filed Vitals:   07/05/13 0517  BP: 133/65  Pulse: 71  Temp: 98.5 F (36.9 C)  Resp: 18   Filed Vitals:   07/04/13 1141 07/04/13 1318 07/04/13 2122 07/05/13 0517  BP:  102/59 129/76 133/65  Pulse:  122 58 71  Temp:  97.7 F (36.5 C) 98.6 F (37 C) 98.5 F (36.9 C)  TempSrc:  Oral Oral Oral  Resp:  18 18 18   Height:      Weight:      SpO2: 100% 98% 99% 100%    General: Pt is alert, follows commands appropriately, not in acute distress Cardiovascular: Regular rate and rhythm, S1/S2 +, no murmurs, no rubs, no  gallops Respiratory: Clear to auscultation bilaterally, no wheezing, no crackles, no rhonchi Abdominal: Soft, non tender, non distended, bowel sounds +, no guarding Extremities: no edema, no cyanosis, pulses palpable bilaterally DP and PT Neuro: Grossly nonfocal  Discharge Instructions  Discharge Orders   Future Appointments Provider Department Dept Phone   07/05/2013 11:00 AM [facility] [phone]   07/13/2013 11:45 AM [facility] Only [place] [facility] [phone]   07/19/2013 9:45 AM Beverely Pace [facility] [place] [facility] [phone]   07/19/2013 10:15 AM Conni Slipper, PA-C [place] [facility] [phone]   07/19/2013 11:00 AM [facility] [place] [facility] [phone]   07/27/2013 9:30 AM Delcie Roch [place] [facility] [phone]   07/27/2013 10:45 AM Loreli Slot, MD [facility] [phone]   08/09/2013 10:15 AM [facility] [phone]   Future Orders Complete By Expires   Diet - low sodium heart healthy  As directed    Increase activity slowly  As directed        Medication List    STOP taking these medications       HYDROcodone-acetaminophen 10-325 MG per tablet  Commonly known as:  NORCO      TAKE these medications       albuterol 108 (90 BASE) MCG/ACT inhaler  Commonly known as:  PROVENTIL HFA;VENTOLIN HFA  Inhale 1-2 puffs into the lungs every 4 (four) hours as needed for wheezing.     ALPRAZolam 0.25 MG tablet  Commonly known as:  XANAX  Take 1 tablet (0.25 mg total) by mouth at bedtime as needed for sleep.     aspirin EC 81 MG tablet  Take 81 mg by mouth every morning.     dexamethasone 4 MG tablet  Commonly known as:  DECADRON  4 Milligram by mouth twice a day the day before, day of and  day after the chemotherapy every three-week     enoxaparin 40 MG/0.4ML injection  Commonly known as:  LOVENOX  Inject 0.4 mLs (40 mg total) into the skin daily.     folic acid 1 MG tablet  Commonly known as:  FOLVITE  Take 1 mg by mouth every morning.     gabapentin 100 MG capsule  Commonly known as:  NEURONTIN  Take 1 capsule (100 mg total) by mouth 3 (three) times daily.     lisinopril-hydrochlorothiazide 20-25 MG per tablet  Commonly known as:  PRINZIDE,ZESTORETIC  Take 1 tablet by mouth every morning.     oxyCODONE-acetaminophen 5-325 MG per tablet  Commonly known as:  ROXICET  Take 1-2 tablets by mouth every 4 (four) hours as needed for pain.     pravastatin 40 MG tablet  Commonly known as:  PRAVACHOL  Take 40 mg by mouth at bedtime.     PRESCRIPTION MEDICATION  He receives his treatments at the [facility] and his oncologist is Dr. Arbutus Ped. He is currently receiving Alimta and Cisplatin every 3 weeks. He received his first dose on 06/28/13.     prochlorperazine 10  MG tablet  Commonly known as:  COMPAZINE  Take 10 mg by mouth every 6 (six) hours as needed (For nausea.).     zolpidem 5 MG tablet  Commonly known as:  AMBIEN  Take 1 tablet (5 mg total) by mouth at bedtime as needed and may repeat dose one time if needed for sleep.           Follow-up Information   Follow up with BEANE,JEFFREY C, MD In 2 weeks.   Specialty:  Orthopedic Surgery   Contact information:   [address] [place] [address] [phone]       Follow up with [facility], MD In 2 weeks.   Specialty:  Family Medicine   Contact information:   Dr. Burnell Blanks [address] [place] [address] [phone]       Follow up with Debbora Presto, MD. (As needed if symptoms worsen)    Specialty:  Internal Medicine   Contact information:   [address] [place] [address] [phone]       Follow up with [facility] PT and RN services)    Contact information:   [address] [place] [address] [phone]        The results of significant diagnostics from this hospitalization (including imaging, microbiology, ancillary and laboratory) are listed below for reference.     Microbiology: No results found for this or any previous visit (from the past 240 hour(s)).   Labs: Basic Metabolic Panel:  Recent Labs Lab 07/01/13 1610 07/02/13 0455 07/03/13 0506 07/04/13 0455  NA 130* 130* 132* 134*  K 3.5 3.8 4.5 4.2  CL 94* 96 96 96  CO2 28 26 31  32  GLUCOSE 86 119* 134* 121*  BUN 26* 21 20 20   CREATININE 1.01 0.84 0.83 0.82  CALCIUM 8.5 8.1* 9.0 9.1   Liver Function Tests:  Recent Labs Lab 07/01/13 1610 07/02/13 0455  AST 26 23  ALT 37 34  ALKPHOS 51 51  BILITOT 0.5 0.5  PROT 6.2 5.8*  ALBUMIN 3.1* 2.9*   CBC:  Recent Labs Lab 07/01/13 1610 07/02/13 0455 07/03/13 0506 07/04/13 0455  WBC 12.0* 12.0* 12.0* 9.7  NEUTROABS 8.9*  --   --   --   HGB 12.7* 11.6* 11.9* 11.6*  HCT 37.9* 35.1* 35.6* 35.1*  MCV 81.5 81.8 80.7 81.6  PLT 253 260 251 244   CBG:  Recent Labs Lab 07/01/13 1621  GLUCAP 84     SIGNED: Time coordinating discharge: Over 30 minutes  Debbora Presto, MD  [facility] 07/05/2013, 10:31 AM Pager [phone]  If 7PM-7AM, please contact night-coverage [email or web address] Password [email or web address]

## 2013-07-05 NOTE — Progress Notes (Signed)
Discharge instructions explained using teach back, prescriptions given, Stable for discharge.

## 2013-07-05 NOTE — Progress Notes (Signed)
Physical Therapy Treatment Patient Details Name: [name] [name] MRN: [id number] DOB: [birth year] Today's Date: 07/05/2013 Time: 1191-4782 PT Time Calculation (min): 15 min  PT Assessment / Plan / Recommendation  History of Present Illness R tib/fib fx, s/p ORIF   PT Comments   *Progressing with mobility, walked 34' with RW and min A for loss of balance x 2. Encouraged pt to have stand by assist when walking at home, or to use WC. **  Follow Up Recommendations  Home health PT     Does the patient have the potential to tolerate intense rehabilitation     Barriers to Discharge        Equipment Recommendations  Rolling walker with 5" wheels;Wheelchair (measurements PT);3in1 (PT) (tall, wide RW, wide3in1 Pt requests std w/c elevating legres)    Recommendations for Other Services    Frequency Min 3X/week   Progress towards PT Goals Progress towards PT goals: Progressing toward goals  Plan Current plan remains appropriate    Precautions / Restrictions Precautions Precautions: Fall Required Braces or Orthoses: Knee Immobilizer - Right Restrictions Weight Bearing Restrictions: Yes RLE Weight Bearing: Touchdown weight bearing   Pertinent Vitals/Pain *6/10 RLE Pain meds requested Pt refused ice**    Mobility  Bed Mobility Bed Mobility: Not assessed Transfers Sit to Stand: 4: Min guard;From chair/3-in-1 Stand to Sit: 4: Min guard;To chair/3-in-1 Details for Transfer Assistance: min/guard for balance/safety, good hand placement Ambulation/Gait Ambulation/Gait Assistance: 4: Min assist;4: Min guard Ambulation Distance (Feet): 32 Feet Assistive device: Rolling walker Gait Pattern: Step-to pattern Gait velocity: distance limited by SOB/fatigue. Pain 6/10 RLE with walking. Pt refused ice. Encouraged pt to have supervision when walking at home 2* decreased balance.  General Gait Details: LOB x 2 requiring min A, pt chooses to be NWB RLE, he feels he'll put too much weight on RLE if he  attempts TDWB    Exercises General Exercises - Lower Extremity Ankle Circles/Pumps: AROM;Both Quad Sets: AROM;Right;5 reps Short Arc Quad: AROM;Right;10 reps;Supine Hip ABduction/ADduction: AROM;Right;10 reps;Supine   PT Diagnosis:    PT Problem List:   PT Treatment Interventions:     PT Goals (current goals can now be found in the care plan section) Acute Rehab PT Goals Patient Stated Goal: pt stated family built his WC ramp Time For Goal Achievement: 07/16/13 Potential to Achieve Goals: Good  Visit Information  Last PT Received On: 07/05/13 Assistance Needed: +1 History of Present Illness: R tib/fib fx, s/p ORIF    Subjective Data  Patient Stated Goal: pt stated family built his WC ramp   Cognition  Cognition Arousal/Alertness: Awake/alert Behavior During Therapy: WFL for tasks assessed/performed Overall Cognitive Status: Within Functional Limits for tasks assessed    Balance     End of Session PT - End of Session Equipment Utilized During Treatment: Right knee immobilizer Activity Tolerance: Patient tolerated treatment well;Patient limited by fatigue Patient left: in chair Nurse Communication: Mobility status   GP     [name] 07/05/2013, 1:54 PM [phone]

## 2013-07-05 NOTE — Progress Notes (Signed)
[facility]  [facility] is providing the following services: RW, Commode, Wheelchair  If patient discharges after hours, please call [phone].   Renard Hamper [phone] 07/05/2013, 9:23 AM

## 2013-07-05 NOTE — Progress Notes (Signed)
Subjective: 4 Days Post-Op Procedure(s) (LRB): OPEN REDUCTION INTERNAL FIXATION TIBIAL  FRACTURE (Right) Patient reports pain as mild.  Pain improving. No other c/o. Feels ready for D/C home today  Objective: Vital signs in last 24 hours: Temp:  [97.7 F (36.5 C)-98.6 F (37 C)] 98.5 F (36.9 C) (08/25 0517) Pulse Rate:  [58-122] 71 (08/25 0517) Resp:  [18] 18 (08/25 0517) BP: (102-133)/(59-76) 133/65 mmHg (08/25 0517) SpO2:  [98 %-100 %] 100 % (08/25 0517)  Intake/Output from previous day: 08/24 0701 - 08/25 0700 In: 1800 [P.O.:1800] Out: 2601 [Urine:2600; Stool:1] Intake/Output this shift:     Recent Labs  07/03/13 0506 07/04/13 0455  HGB 11.9* 11.6*    Recent Labs  07/03/13 0506 07/04/13 0455  WBC 12.0* 9.7  RBC 4.41 4.30  HCT 35.6* 35.1*  PLT 251 244    Recent Labs  07/03/13 0506 07/04/13 0455  NA 132* 134*  K 4.5 4.2  CL 96 96  CO2 31 32  BUN 20 20  CREATININE 0.83 0.82  GLUCOSE 134* 121*  CALCIUM 9.0 9.1   No results found for this basename: LABPT, INR,  in the last 72 hours  Neurologically intact ABD soft Neurovascular intact Sensation intact distally Intact pulses distally Dorsiflexion/Plantar flexion intact Incision: dressing C/D/I and no drainage No cellulitis present Compartment soft No calf pain  Assessment/Plan: 4 Days Post-Op Procedure(s) (LRB): OPEN REDUCTION INTERNAL FIXATION TIBIAL  FRACTURE (Right) Advance diet Up with therapy D/C planned for later today home with HHPT Discussed D/C instructions TTWB RLE, may remove knee immobilizer to work on GROM knee Lovenox for DVT ppx Follow with Dr. Shelle Iron in 10-14 days Will discuss with Dr. Elissa Lovett, Dayna Barker. 07/05/2013, 7:56 AM

## 2013-07-05 NOTE — Progress Notes (Signed)
Occupational Therapy Treatment Patient Details Name: [name] [name] MRN: [id number] DOB: 1956/08/25 Today's Date: 07/05/2013 Time: 4098-1191 OT Time Calculation (min): 12 min  OT Assessment / Plan / Recommendation  History of present illness R tib/fib fx, s/p ORIF   OT comments  Making good progress  Follow Up Recommendations  No OT follow up;Supervision/Assistance - 24 hour       Equipment Recommendations  3 in 1 bedside comode       Frequency Min 2X/week      Plan Discharge plan remains appropriate           ADL  Upper Body Dressing: Performed;Set up Where Assessed - Upper Body Dressing: Unsupported sitting Lower Body Dressing: Performed;Min guard Where Assessed - Lower Body Dressing: Unsupported sit to stand Toilet Transfer: Performed;Supervision/safety Toilet Transfer Method: Sit to stand Toileting - Clothing Manipulation and Hygiene: Other (comment);Performed;Supervision/safety (urinal)     OT Goals(current goals can now be found in the care plan section) Acute Rehab OT Goals Patient Stated Goal: to stay here until his ramp is built and he gets to talk with Dr. Shelle Iron  Visit Information  Assistance Needed: +1 History of Present Illness: R tib/fib fx, s/p ORIF          Cognition  Cognition Arousal/Alertness: Awake/alert Behavior During Therapy: WFL for tasks assessed/performed Overall Cognitive Status: Within Functional Limits for tasks assessed             End of Session OT - End of Session Activity Tolerance: Patient tolerated treatment well Patient left: in chair;with call bell/phone within reach;with family/visitor present  GO     [name] [name], [place] Kung 07/05/2013, 1:11 PM

## 2013-07-07 ENCOUNTER — Telehealth: Payer: Self-pay | Admitting: Dietician

## 2013-07-07 NOTE — Telephone Encounter (Signed)
Weekly lab order for 9/2 is going to be drawn at home through [facility] per pt request.  Pt was just d/c'd from the hospital after breaking his leg and he would prefer to stay at home and have lab drawn at home on 9/2.  Orders placed in EPIC and Spoke with Kristen at [facility] to coordinate lab draw on 9/2.  SLJ

## 2013-07-13 ENCOUNTER — Encounter: Payer: Self-pay | Admitting: Internal Medicine

## 2013-07-13 ENCOUNTER — Encounter: Payer: Self-pay | Admitting: Medical Oncology

## 2013-07-13 ENCOUNTER — Other Ambulatory Visit: Payer: BC Managed Care – PPO

## 2013-07-13 NOTE — Progress Notes (Signed)
I mailed handicap application placard to pt.

## 2013-07-14 ENCOUNTER — Encounter: Payer: Self-pay | Admitting: Thoracic Surgery (Cardiothoracic Vascular Surgery)

## 2013-07-18 ENCOUNTER — Encounter: Payer: Self-pay | Admitting: Thoracic Surgery (Cardiothoracic Vascular Surgery)

## 2013-07-19 ENCOUNTER — Telehealth: Payer: Self-pay | Admitting: Internal Medicine

## 2013-07-19 ENCOUNTER — Encounter: Payer: Self-pay | Admitting: Physician Assistant

## 2013-07-19 ENCOUNTER — Ambulatory Visit: Payer: BC Managed Care – PPO

## 2013-07-19 ENCOUNTER — Ambulatory Visit (HOSPITAL_BASED_OUTPATIENT_CLINIC_OR_DEPARTMENT_OTHER): Payer: BC Managed Care – PPO | Admitting: Physician Assistant

## 2013-07-19 ENCOUNTER — Other Ambulatory Visit (HOSPITAL_BASED_OUTPATIENT_CLINIC_OR_DEPARTMENT_OTHER): Payer: BC Managed Care – PPO | Admitting: Lab

## 2013-07-19 VITALS — BP 114/71 | HR 85 | Temp 98.4°F | Resp 20

## 2013-07-19 DIAGNOSIS — C3492 Malignant neoplasm of unspecified part of left bronchus or lung: Secondary | ICD-10-CM

## 2013-07-19 DIAGNOSIS — L02619 Cutaneous abscess of unspecified foot: Secondary | ICD-10-CM

## 2013-07-19 DIAGNOSIS — C3430 Malignant neoplasm of lower lobe, unspecified bronchus or lung: Secondary | ICD-10-CM

## 2013-07-19 DIAGNOSIS — C771 Secondary and unspecified malignant neoplasm of intrathoracic lymph nodes: Secondary | ICD-10-CM

## 2013-07-19 LAB — COMPREHENSIVE METABOLIC PANEL (CC13)
Alkaline Phosphatase: 93 U/L (ref 40–150)
BUN: 8.3 mg/dL (ref 7.0–26.0)
Glucose: 208 mg/dl — ABNORMAL HIGH (ref 70–140)
Sodium: 137 mEq/L (ref 136–145)
Total Bilirubin: 0.24 mg/dL (ref 0.20–1.20)
Total Protein: 8.1 g/dL (ref 6.4–8.3)

## 2013-07-19 LAB — CBC WITH DIFFERENTIAL/PLATELET
Eosinophils Absolute: 0 10*3/uL (ref 0.0–0.5)
LYMPH%: 12.5 % — ABNORMAL LOW (ref 14.0–49.0)
MCHC: 33.3 g/dL (ref 32.0–36.0)
MCV: 81.8 fL (ref 79.3–98.0)
MONO%: 4.6 % (ref 0.0–14.0)
NEUT#: 8.3 10*3/uL — ABNORMAL HIGH (ref 1.5–6.5)
Platelets: 727 10*3/uL — ABNORMAL HIGH (ref 140–400)
RBC: 4.1 10*6/uL — ABNORMAL LOW (ref 4.20–5.82)

## 2013-07-19 NOTE — Progress Notes (Addendum)
[facility] [phone]   [phone]  OFFICE PROGRESS NOTE  Ailene Ravel, MD Dr. Sharman Crate [place]rick 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  2) stage IIB (T3, N1, M0) non-small cell lung cancer, adenocarcinoma diagnosed in June of 2014.   PRIOR THERAPY:  1) Status post 3 cycles of systemic chemotherapy with carboplatin and etoposide. Last dose was given on 07/16/2011.  2) Left video-assisted thoracoscopy, mini thoracotomy; wedge, posterior segment of left upper lobe; wedge, anterior segment of left lower lobe with node dissection.  3) Redo left video-assisted thoracoscopy, lysis of adhesions, wedge resection of left lower lobe, thoracoscopic left lower lobectomy under the care of Dr. Dorris Fetch on 05/03/2013.   CURRENT THERAPY: Adjuvant chemotherapy with cisplatin 75 mg/M2 and Alimta 500 mg/M2 every 3 weeks, first dose 06/28/2013.   CHEMOTHERAPY INTENT: Adjuvant/Curative.  CURRENT # OF CHEMOTHERAPY CYCLES: 1  CURRENT ANTIEMETICS: N/A  CURRENT SMOKING STATUS: Quit smoking 05/02/13  ORAL CHEMOTHERAPY AND CONSENT: None  CURRENT BISPHOSPHONATES USE: None  PAIN MANAGEMENT: 2/10  NARCOTICS INDUCED CONSTIPATION: None  LIVING WILL AND CODE STATUS: Full Code   INTERVAL HISTORY: [name] [name] 57 y.o. male returns to the clinic today for followup visit accompanied his wife. He is status post a complex fracture of the right lower extremity and is status post rod and screw fixation. This occurred on July 01 2013. Unfortunately the right lower extremity currently has a cellulitic appearance and he was placed on a 10 day course of Keflex. She states that he has a followup appointment with his orthopedist, Dr. Jillyn Hidden of [facility] on 07/23/2013. Between his lung cancer diagnosis and his fracture his anxiety level  has increased. He has done better taking her Xanax twice daily, once in the morning and once at bedtime. He asked that his next prescription be increased to twice a day dosing.  He denied having any significant shortness of breath, cough or hemoptysis. He is here today to start cycle #2 of adjuvant chemotherapy with cisplatin and Alimta. He has no fever or chills. No weight loss or night sweats.  MEDICAL HISTORY: Past Medical History  Diagnosis Date  . Hypercholesteremia     takes Pravastatin nightly  . Lung cancer   . HTN (hypertension)     takes Lisinopril and HCTZ daily  . Shortness of breath     with exertion;Albuterol inhaler prn  . Insomnia     takes Xanax prn    ALLERGIES:  has No Known Allergies.  MEDICATIONS:  Current Outpatient Prescriptions  Medication Sig Dispense Refill  . cephALEXin (KEFLEX) 500 MG capsule Take 500 mg by mouth 4 (four) times daily. 1 tablet by mouth QID x 10 days      . albuterol (PROVENTIL HFA;VENTOLIN HFA) 108 (90 BASE) MCG/ACT inhaler Inhale 1-2 puffs into the lungs every 4 (four) hours as needed for wheezing.      Marland Kitchen ALPRAZolam (XANAX) 0.25 MG tablet Take 1 tablet (0.25 mg total) by mouth at bedtime as needed for sleep.  65 tablet  0  . aspirin EC 81 MG tablet Take 81 mg by mouth every morning.      Marland Kitchen dexamethasone (DECADRON) 4 MG tablet 4 Milligram by mouth twice a day the day before, day of and day after the chemotherapy every three-week  30 tablet  0  .  enoxaparin (LOVENOX) 40 MG/0.4ML injection Inject 0.4 mLs (40 mg total) into the skin daily.  14 Syringe  0  . folic acid (FOLVITE) 1 MG tablet Take 1 mg by mouth every morning.      . gabapentin (NEURONTIN) 100 MG capsule Take 1 capsule (100 mg total) by mouth 3 (three) times daily.  90 capsule  1  . HYDROcodone-acetaminophen (NORCO) 10-325 MG per tablet       . lisinopril-hydrochlorothiazide (PRINZIDE,ZESTORETIC) 20-25 MG per tablet Take 1 tablet by mouth every morning.       Marland Kitchen  oxyCODONE-acetaminophen (ROXICET) 5-325 MG per tablet Take 1-2 tablets by mouth every 4 (four) hours as needed for pain.  90 tablet  0  . pravastatin (PRAVACHOL) 40 MG tablet Take 40 mg by mouth at bedtime.       Marland Kitchen PRESCRIPTION MEDICATION He receives his treatments at the [facility] and his oncologist is Dr. Arbutus Ped. He is currently receiving Alimta and Cisplatin every 3 weeks. He received his first dose on 06/28/13.      Marland Kitchen prochlorperazine (COMPAZINE) 10 MG tablet Take 10 mg by mouth every 6 (six) hours as needed (For nausea.).      Marland Kitchen zolpidem (AMBIEN) 5 MG tablet Take 1 tablet (5 mg total) by mouth at bedtime as needed and may repeat dose one time if needed for sleep.  30 tablet  1   No current facility-administered medications for this visit.    SURGICAL HISTORY:  Past Surgical History  Procedure Laterality Date  . Left video-assisted thoracoscopy, mini thoracotomy; wedge, posterior segment of left upper lobe; wedge, anterior segment of left lower lobe with node dissection.  09/02/11    [place]  . Video assisted thoracoscopy Left 05/03/2013    Procedure: REDO VIDEO ASSISTED THORACOSCOPY;  Surgeon: Loreli Slot, MD;  Location: [facility] OR;  Service: Thoracic;  Laterality: Left;  REDO LEFT VATS  . Thoracotomy Left 05/03/2013    Procedure: THORACOTOMY MAJOR;  Surgeon: Loreli Slot, MD;  Location: [facility] OR;  Service: Thoracic;  Laterality: Left;  . Lobectomy Left 05/03/2013    Procedure: LOBECTOMY;  Surgeon: Loreli Slot, MD;  Location: [facility] OR;  Service: Thoracic;  Laterality: Left;  . Wedge resection Left 05/03/2013    Procedure: WEDGE RESECTION;  Surgeon: Loreli Slot, MD;  Location: [facility] OR;  Service: Thoracic;  Laterality: Left;  . Open reduction internal fixation (orif) tibia/fibula fracture Right 07/01/2013    Procedure: OPEN REDUCTION INTERNAL FIXATION TIBIAL  FRACTURE;  Surgeon: Javier Docker, MD;  Location: [facility] ORS;  Service: Orthopedics;  Laterality:  Right;    REVIEW OF SYSTEMS:  Pertinent items are noted in HPI.   PHYSICAL EXAMINATION: General appearance: alert, cooperative and no distress Head: Normocephalic, without obvious abnormality, atraumatic Neck: no adenopathy Lymph nodes: Cervical, supraclavicular, and axillary nodes normal. Resp: clear to auscultation bilaterally Cardio: regular rate and rhythm, S1, S2 normal, no murmur, click, rub or gallop GI: soft, non-tender; bowel sounds normal; no masses,  no organomegaly Extremities: Right lower extremity is erythematous and warm starting at the foot to just below the knee, there is 1+ soft tissue swelling left lower extremity is without edema or erythema Neurologic: Alert and oriented X 3, normal strength and tone. Normal symmetric reflexes. Normal coordination and gait  ECOG PERFORMANCE STATUS: 1 - Symptomatic but completely ambulatory  Blood pressure 114/71, pulse 85, temperature 98.4 F (36.9 C), temperature source Oral, resp. rate 20.  LABORATORY DATA: Lab Results  Component Value Date   WBC 10.1 07/19/2013   HGB 11.1* 07/19/2013   HCT 33.5* 07/19/2013   MCV 81.8 07/19/2013   PLT 727* 07/19/2013      Chemistry      Component Value Date/Time   NA 137 07/19/2013 1005   NA 134* 07/04/2013 0455   NA 138 03/25/2012 1225   K 3.9 07/19/2013 1005   K 4.2 07/04/2013 0455   K 4.2 03/25/2012 1225   CL 96 07/04/2013 0455   CL 105 03/03/2013 1002   CL 98 03/25/2012 1225   CO2 22 07/19/2013 1005   CO2 32 07/04/2013 0455   CO2 26 03/25/2012 1225   BUN 8.3 07/19/2013 1005   BUN 20 07/04/2013 0455   BUN 11 03/25/2012 1225   CREATININE 0.8 07/19/2013 1005   CREATININE 0.82 07/04/2013 0455   CREATININE 1.0 03/25/2012 1225      Component Value Date/Time   CALCIUM 9.7 07/19/2013 1005   CALCIUM 9.1 07/04/2013 0455   CALCIUM 8.6 03/25/2012 1225   ALKPHOS 93 07/19/2013 1005   ALKPHOS 51 07/02/2013 0455   ALKPHOS 84 03/25/2012 1225   AST 15 07/19/2013 1005   AST 23 07/02/2013 0455   AST 33 03/25/2012 1225   ALT  20 07/19/2013 1005   ALT 34 07/02/2013 0455   ALT 43 03/25/2012 1225   BILITOT 0.24 07/19/2013 1005   BILITOT 0.5 07/02/2013 0455   BILITOT 0.80 03/25/2012 1225       RADIOGRAPHIC STUDIES: No results found.  ASSESSMENT AND PLAN: This is a very pleasant 57 years old white male recently diagnosed with a stage IIb non-small cell lung cancer, adenocarcinoma status post left lower lobectomy. The patient is status post 1 cycle of adjuvant chemotherapy with cisplatin and Alimta. Patient was discussed with also seen by Dr. Arbutus Ped. In view of his right lower extremity cellulitis we will postpone his chemotherapy by least one week. He will return in one week and we will reevaluate him and proceed with cycle #2 of his adjuvant chemotherapy if his cellulitis has resolved. He is encouraged to complete his course of  Keflex as prescribed and followup with his orthopedist as scheduled. He may take a Xanax twice daily as needed and when he is due for refills we will give him a new prescription reflecting this dosage.  Laural Benes, Zackry Deines E, PA-C   He was advised to call immediately if he has any concerning symptoms in the interval. The patient voices understanding of current disease status and treatment options and is in agreement with the current care plan.  All questions were answered. The patient knows to call the clinic with any problems, questions or concerns. We can certainly see the patient much sooner if necessary.     ADDENDUM: Hematology/Oncology Attending: I have a face to face encounter with the patient. I recommended his care plan. This is a very pleasant 57 years old white male recently diagnosed with stage IIb non-small cell lung cancer, adenocarcinoma status post left lower lobe wedge resection with lymph node dissection. The patient is currently undergoing adjuvant chemotherapy with cisplatin and Alimta status post 1 cycle. He has a right leg fracture recently status post internal fixation. The  patient is developing cellulitis of the right foot and currently on treatment with Keflex. I recommended for him to delay cycle #2 of his chemotherapy until next week or until complete resolution of the cellulitis of the right foot. The patient would come back for follow up visit in  one week for evaluation before starting the second cycle of his treatment. He was given a refill for Xanax for anxity. Lajuana Matte., MD 07/20/2013

## 2013-07-19 NOTE — Telephone Encounter (Signed)
Pt wants to do all appts on same day 9/16, lab here then see TCTS then back here for chemo , ML will see pt @ chemo room , called Marcelino Duster and left message

## 2013-07-19 NOTE — Telephone Encounter (Signed)
Pt will not draw labs here , will be drawn by nurse @ pt's home

## 2013-07-19 NOTE — Patient Instructions (Signed)
A. chemotherapy is being postponed by 1 week due to your right lower extremity cellulitis Complete her course of antibiotics as prescribed Followup in one week

## 2013-07-19 NOTE — Anesthesia Postprocedure Evaluation (Signed)
Anesthesia Post Note  Patient: [name] [name]  Procedure(s) Performed: Procedure(s) (LRB): OPEN REDUCTION INTERNAL FIXATION TIBIAL  FRACTURE (Right)  Anesthesia type: Spinal  Patient location: PACU  Post pain: Pain level controlled  Post assessment: Post-op Vital signs reviewed  Last Vitals:  Filed Vitals:   07/05/13 1432  BP: 115/60  Pulse: 75  Temp: 36.8 C  Resp: 18    Post vital signs: Reviewed  Level of consciousness: sedated  Complications: No apparent anesthesia complications

## 2013-07-19 NOTE — Telephone Encounter (Signed)
called pt and left message regardinf appt on 9/16 lab, ML and chemo

## 2013-07-20 ENCOUNTER — Telehealth: Payer: Self-pay | Admitting: Medical Oncology

## 2013-07-20 NOTE — Telephone Encounter (Signed)
Wants labs drawn by [facility]  Weekly when pt does not come here for other appts. I faxed order to [facility]

## 2013-07-23 ENCOUNTER — Other Ambulatory Visit: Payer: Self-pay | Admitting: *Deleted

## 2013-07-26 ENCOUNTER — Other Ambulatory Visit: Payer: BC Managed Care – PPO

## 2013-07-27 ENCOUNTER — Ambulatory Visit
Admission: RE | Admit: 2013-07-27 | Discharge: 2013-07-27 | Disposition: A | Discharge status: Home / Self Care | Payer: BC Managed Care – PPO | Source: Ambulatory Visit | Attending: Thoracic Surgery (Cardiothoracic Vascular Surgery) | Admitting: Thoracic Surgery (Cardiothoracic Vascular Surgery)

## 2013-07-27 ENCOUNTER — Other Ambulatory Visit (HOSPITAL_BASED_OUTPATIENT_CLINIC_OR_DEPARTMENT_OTHER): Payer: BC Managed Care – PPO | Admitting: Lab

## 2013-07-27 ENCOUNTER — Telehealth: Payer: Self-pay | Admitting: Internal Medicine

## 2013-07-27 ENCOUNTER — Ambulatory Visit (INDEPENDENT_AMBULATORY_CARE_PROVIDER_SITE_OTHER): Payer: BC Managed Care – PPO | Admitting: Thoracic Surgery (Cardiothoracic Vascular Surgery)

## 2013-07-27 ENCOUNTER — Ambulatory Visit (HOSPITAL_BASED_OUTPATIENT_CLINIC_OR_DEPARTMENT_OTHER): Payer: BC Managed Care – PPO | Admitting: Physician Assistant

## 2013-07-27 ENCOUNTER — Encounter: Payer: Self-pay | Admitting: Physician Assistant

## 2013-07-27 ENCOUNTER — Ambulatory Visit (HOSPITAL_BASED_OUTPATIENT_CLINIC_OR_DEPARTMENT_OTHER): Payer: BC Managed Care – PPO

## 2013-07-27 ENCOUNTER — Encounter: Payer: BC Managed Care – PPO | Admitting: Thoracic Surgery (Cardiothoracic Vascular Surgery)

## 2013-07-27 ENCOUNTER — Encounter: Payer: Self-pay | Admitting: Thoracic Surgery (Cardiothoracic Vascular Surgery)

## 2013-07-27 VITALS — BP 108/71 | HR 90 | Temp 97.0°F

## 2013-07-27 VITALS — BP 110/72 | HR 77 | Resp 20 | Ht 73.0 in | Wt 257.0 lb

## 2013-07-27 DIAGNOSIS — Z5111 Encounter for antineoplastic chemotherapy: Secondary | ICD-10-CM

## 2013-07-27 DIAGNOSIS — Z902 Acquired absence of lung [part of]: Secondary | ICD-10-CM

## 2013-07-27 DIAGNOSIS — C3490 Malignant neoplasm of unspecified part of unspecified bronchus or lung: Secondary | ICD-10-CM

## 2013-07-27 DIAGNOSIS — L0291 Cutaneous abscess, unspecified: Secondary | ICD-10-CM

## 2013-07-27 DIAGNOSIS — C3430 Malignant neoplasm of lower lobe, unspecified bronchus or lung: Secondary | ICD-10-CM

## 2013-07-27 DIAGNOSIS — C3492 Malignant neoplasm of unspecified part of left bronchus or lung: Secondary | ICD-10-CM

## 2013-07-27 DIAGNOSIS — Z9889 Other specified postprocedural states: Secondary | ICD-10-CM

## 2013-07-27 LAB — COMPREHENSIVE METABOLIC PANEL (CC13)
Alkaline Phosphatase: 100 U/L (ref 40–150)
BUN: 8.8 mg/dL (ref 7.0–26.0)
Glucose: 179 mg/dl — ABNORMAL HIGH (ref 70–140)
Total Bilirubin: 0.2 mg/dL (ref 0.20–1.20)

## 2013-07-27 LAB — CBC WITH DIFFERENTIAL/PLATELET
Basophils Absolute: 0.1 10*3/uL (ref 0.0–0.1)
Eosinophils Absolute: 0 10*3/uL (ref 0.0–0.5)
HCT: 33.6 % — ABNORMAL LOW (ref 38.4–49.9)
HGB: 11.1 g/dL — ABNORMAL LOW (ref 13.0–17.1)
LYMPH%: 13.1 % — ABNORMAL LOW (ref 14.0–49.0)
MCV: 80.4 fL (ref 79.3–98.0)
MONO%: 7.4 % (ref 0.0–14.0)
NEUT#: 12.4 10*3/uL — ABNORMAL HIGH (ref 1.5–6.5)
NEUT%: 79.1 % — ABNORMAL HIGH (ref 39.0–75.0)
Platelets: 494 10*3/uL — ABNORMAL HIGH (ref 140–400)

## 2013-07-27 MED ORDER — DEXAMETHASONE SODIUM PHOSPHATE 20 MG/5ML IJ SOLN
INTRAMUSCULAR | Status: AC
  Filled 2013-07-27: qty 5

## 2013-07-27 MED ORDER — SODIUM CHLORIDE 0.9 % IV SOLN
150.0000 mg | Freq: Once | INTRAVENOUS | Status: AC
  Administered 2013-07-27: 150 mg via INTRAVENOUS
  Filled 2013-07-27: qty 5

## 2013-07-27 MED ORDER — PALONOSETRON HCL INJECTION 0.25 MG/5ML
0.2500 mg | Freq: Once | INTRAVENOUS | Status: AC
  Administered 2013-07-27: 0.25 mg via INTRAVENOUS

## 2013-07-27 MED ORDER — PALONOSETRON HCL INJECTION 0.25 MG/5ML
INTRAVENOUS | Status: AC
  Filled 2013-07-27: qty 5

## 2013-07-27 MED ORDER — POTASSIUM CHLORIDE 2 MEQ/ML IV SOLN
Freq: Once | INTRAVENOUS | Status: AC
  Administered 2013-07-27: 12:00:00 via INTRAVENOUS
  Filled 2013-07-27: qty 10

## 2013-07-27 MED ORDER — SODIUM CHLORIDE 0.9 % IV SOLN
Freq: Once | INTRAVENOUS | Status: AC
  Administered 2013-07-27: 12:00:00 via INTRAVENOUS

## 2013-07-27 MED ORDER — SODIUM CHLORIDE 0.9 % IV SOLN
500.0000 mg/m2 | Freq: Once | INTRAVENOUS | Status: AC
  Administered 2013-07-27: 1200 mg via INTRAVENOUS
  Filled 2013-07-27: qty 48

## 2013-07-27 MED ORDER — DEXAMETHASONE SODIUM PHOSPHATE 20 MG/5ML IJ SOLN
12.0000 mg | Freq: Once | INTRAMUSCULAR | Status: AC
  Administered 2013-07-27: 12 mg via INTRAVENOUS

## 2013-07-27 MED ORDER — SODIUM CHLORIDE 0.9 % IV SOLN
75.0000 mg/m2 | Freq: Once | INTRAVENOUS | Status: AC
  Administered 2013-07-27: 179 mg via INTRAVENOUS
  Filled 2013-07-27: qty 179

## 2013-07-27 NOTE — Progress Notes (Signed)
HPI:  [name] [name] returns today for a scheduled 3 month followup visit.  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 He presented with a new left lower lobe mass. This turned out to be a stage IIA non-small cell carcinoma. He has had one cycle of chemotherapy. A few days after his chemotherapy he stood up quickly and became lightheaded and fell. He he suffered a right tib-fib fracture. He had a rod placed by Dr. Shelle Iron. He was scheduled chemotherapy last Monday but that I was not given and he is going to go this afternoon to see if he can get his second cycle.  He has not had any respiratory difficulties. He does feel short of breath with heavy exertion, but is okay with light to moderate exertion. He's not having any significant incisional pain.  Past Medical History  Diagnosis Date  . Hypercholesteremia     takes Pravastatin nightly  . Lung cancer   . HTN (hypertension)     takes Lisinopril and HCTZ daily  . Shortness of breath     with exertion;Albuterol inhaler prn  . Insomnia     takes Xanax prn      Current Outpatient Prescriptions  Medication Sig Dispense Refill  . albuterol (PROVENTIL HFA;VENTOLIN HFA) 108 (90 BASE) MCG/ACT inhaler Inhale 1-2 puffs into the lungs every 4 (four) hours as needed for wheezing.      Marland Kitchen ALPRAZolam (XANAX) 0.25 MG tablet Take 1 tablet (0.25 mg total) by mouth at bedtime as needed for sleep.  65 tablet  0  . aspirin EC 81 MG tablet Take 81 mg by mouth every morning.      . cephALEXin (KEFLEX) 500 MG capsule Take 500 mg by mouth 4 (four) times daily. 1 tablet by mouth QID x 10 days      . dexamethasone (DECADRON) 4 MG tablet 4 Milligram by mouth twice a day the day before, day of and day after the chemotherapy every three-week  30 tablet  0  . folic acid (FOLVITE) 1 MG tablet Take 1 mg by mouth every morning.      . gabapentin (NEURONTIN) 100 MG capsule Take 1  capsule (100 mg total) by mouth 3 (three) times daily.  90 capsule  1  . lisinopril-hydrochlorothiazide (PRINZIDE,ZESTORETIC) 20-25 MG per tablet Take 1 tablet by mouth every morning.       Marland Kitchen oxyCODONE-acetaminophen (ROXICET) 5-325 MG per tablet Take 1-2 tablets by mouth every 4 (four) hours as needed for pain.  90 tablet  0  . pravastatin (PRAVACHOL) 40 MG tablet Take 40 mg by mouth at bedtime.       Marland Kitchen PRESCRIPTION MEDICATION He receives his treatments at the [facility] and his oncologist is Dr. Arbutus Ped. He is currently receiving Alimta and Cisplatin every 3 weeks. He received his first dose on 06/28/13.      Marland Kitchen prochlorperazine (COMPAZINE) 10 MG tablet Take 10 mg by mouth every 6 (six) hours as needed (For nausea.).      Marland Kitchen zolpidem (AMBIEN) 5 MG tablet Take 1 tablet (5 mg total) by mouth at bedtime as needed and may repeat dose one time if needed for sleep.  30 tablet  1  . enoxaparin (LOVENOX) 40 MG/0.4ML injection Inject 0.4 mLs (40 mg total) into the skin daily.  14 Syringe  0   No current facility-administered medications for this visit.  Physical Exam BP 110/72  Pulse 77  Resp 20  Ht 6\' 1"  (1.854 m)  Wt 257 lb (116.574 kg)  BMI 33.91 kg/m2  SpO64 55% 57 year old in no acute distress. He is in a wheelchair  Neurologic alert and oriented x3 with no focal deficits Lungs diminished breath sounds left base otherwise clear Thoracotomy incision and chest tube sites well-healed No cervical or supraclavicular adenopathy  Diagnostic Tests: Chest x-ray at 07/27/2013 EXAM:  CHEST 2 VIEW  COMPARISON: 07/01/2013 and earlier.  FINDINGS:  Postoperative changes in the left lung with volume loss. Interval  improved residual left lung ventilation. Mild nodular opacity along  staple lines in the left lung not significantly changed. Stable  cardiac size and mediastinal contours. Visualized tracheal air  column is within normal limits. The right lung is clear. No pleural   effusion. No acute osseous abnormality identified. Left lateral rib  likely postoperative changes.  IMPRESSION:  Postoperative changes to the left lung with interval improved  ventilation. Continued attention directed on follow up exams to  residual nodular opacity along the left lung staple lines.  Electronically Signed  By: Augusto Gamble M.D.  Impression: 57 year old gentleman now 3 months out from a left lower lobectomy for stage II a non-small cell carcinoma. He has had one of 3 planned cycles of chemotherapy.  From a thoracic surgical standpoint he is doing well.  He did suffer a right tib-fib fracture which required a ORIF. He developed cellulitis which is being treated with Keflex and is improving.  Plan: Return in 3 months for a 6 month followup visit with a CT of the chest.

## 2013-07-27 NOTE — Telephone Encounter (Signed)
, °

## 2013-07-27 NOTE — Progress Notes (Addendum)
[facility] [phone]   Fax:([phone]  OFFICE PROGRESS NOTE  Ailene Ravel, MD Dr. Sharman Crate Hamrick [address] 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  2) stage IIB (T3, N1, M0) non-small cell lung cancer, adenocarcinoma diagnosed in June of 2014.   PRIOR THERAPY:  1) Status post 3 cycles of systemic chemotherapy with carboplatin and etoposide. Last dose was given on 07/16/2011.  2) Left video-assisted thoracoscopy, mini thoracotomy; wedge, posterior segment of left upper lobe; wedge, anterior segment of left lower lobe with node dissection.  3) Redo left video-assisted thoracoscopy, lysis of adhesions, wedge resection of left lower lobe, thoracoscopic left lower lobectomy under the care of Dr. Dorris Fetch on 05/03/2013.   CURRENT THERAPY: Adjuvant chemotherapy with cisplatin 75 mg/M2 and Alimta 500 mg/M2 every 3 weeks, first dose given 06/28/2013.   CHEMOTHERAPY INTENT: Adjuvant/Curative.  CURRENT # OF CHEMOTHERAPY CYCLES: 1  CURRENT ANTIEMETICS: N/A  CURRENT SMOKING STATUS: Quit smoking 05/02/13  ORAL CHEMOTHERAPY AND CONSENT: None  CURRENT BISPHOSPHONATES USE: None  PAIN MANAGEMENT: 2/10  NARCOTICS INDUCED CONSTIPATION: None  LIVING WILL AND CODE STATUS: Full Code   INTERVAL HISTORY: [name] [name] 57 y.o. male returns to the clinic today for followup visit accompanied his wife. He is status post a complex fracture of the right lower extremity and is status post rod and screw fixation. This occurred on July 01 2013. Unfortunately the right lower extremity currently has a cellulitic appearance and he was placed on a 10 day course of Keflex. She states that he has a followup appointment with his orthopedist, Dr. Jillyn Hidden of [facility] on 07/23/2013. Between his lung cancer diagnosis and his fracture his anxiety  level has increased. He has done better taking her Xanax twice daily, once in the morning and once at bedtime. He asked that his next prescription be increased to twice a day dosing.  He denied having any significant shortness of breath, cough or hemoptysis. He is here today to start cycle #2 of adjuvant chemotherapy with cisplatin and Alimta. He has no fever or chills. No weight loss or night sweats.  MEDICAL HISTORY: Past Medical History  Diagnosis Date  . Hypercholesteremia     takes Pravastatin nightly  . Lung cancer   . HTN (hypertension)     takes Lisinopril and HCTZ daily  . Shortness of breath     with exertion;Albuterol inhaler prn  . Insomnia     takes Xanax prn    ALLERGIES:  has No Known Allergies.  MEDICATIONS:  Current Outpatient Prescriptions  Medication Sig Dispense Refill  . albuterol (PROVENTIL HFA;VENTOLIN HFA) 108 (90 BASE) MCG/ACT inhaler Inhale 1-2 puffs into the lungs every 4 (four) hours as needed for wheezing.      Marland Kitchen ALPRAZolam (XANAX) 0.25 MG tablet Take 1 tablet (0.25 mg total) by mouth at bedtime as needed for sleep.  65 tablet  0  . aspirin EC 81 MG tablet Take 81 mg by mouth every morning.      . cephALEXin (KEFLEX) 500 MG capsule Take 500 mg by mouth 4 (four) times daily. 1 tablet by mouth QID x 10 days      . dexamethasone (DECADRON) 4 MG tablet 4 Milligram by mouth twice a day the day before, day of and day after the chemotherapy every three-week  30 tablet  0  .  enoxaparin (LOVENOX) 40 MG/0.4ML injection Inject 0.4 mLs (40 mg total) into the skin daily.  14 Syringe  0  . folic acid (FOLVITE) 1 MG tablet Take 1 mg by mouth every morning.      . gabapentin (NEURONTIN) 100 MG capsule Take 1 capsule (100 mg total) by mouth 3 (three) times daily.  90 capsule  1  . lisinopril-hydrochlorothiazide (PRINZIDE,ZESTORETIC) 20-25 MG per tablet Take 1 tablet by mouth every morning.       Marland Kitchen oxyCODONE-acetaminophen (ROXICET) 5-325 MG per tablet Take 1-2 tablets by  mouth every 4 (four) hours as needed for pain.  90 tablet  0  . pravastatin (PRAVACHOL) 40 MG tablet Take 40 mg by mouth at bedtime.       Marland Kitchen PRESCRIPTION MEDICATION He receives his treatments at the [facility] and his oncologist is Dr. Arbutus Ped. He is currently receiving Alimta and Cisplatin every 3 weeks. He received his first dose on 06/28/13.      Marland Kitchen prochlorperazine (COMPAZINE) 10 MG tablet Take 10 mg by mouth every 6 (six) hours as needed (For nausea.).      Marland Kitchen zolpidem (AMBIEN) 5 MG tablet Take 1 tablet (5 mg total) by mouth at bedtime as needed and may repeat dose one time if needed for sleep.  30 tablet  1   No current facility-administered medications for this visit.    SURGICAL HISTORY:  Past Surgical History  Procedure Laterality Date  . Left video-assisted thoracoscopy, mini thoracotomy; wedge, posterior segment of left upper lobe; wedge, anterior segment of left lower lobe with node dissection.  09/02/11    [name] [name]  . Video assisted thoracoscopy Left 05/03/2013    Procedure: REDO VIDEO ASSISTED THORACOSCOPY;  Surgeon: Loreli Slot, MD;  Location: [facility] OR;  Service: Thoracic;  Laterality: Left;  REDO LEFT VATS  . Thoracotomy Left 05/03/2013    Procedure: THORACOTOMY MAJOR;  Surgeon: Loreli Slot, MD;  Location: [facility] OR;  Service: Thoracic;  Laterality: Left;  . Lobectomy Left 05/03/2013    Procedure: LOBECTOMY;  Surgeon: Loreli Slot, MD;  Location: [facility] OR;  Service: Thoracic;  Laterality: Left;  . Wedge resection Left 05/03/2013    Procedure: WEDGE RESECTION;  Surgeon: Loreli Slot, MD;  Location: [facility] OR;  Service: Thoracic;  Laterality: Left;  . Open reduction internal fixation (orif) tibia/fibula fracture Right 07/01/2013    Procedure: OPEN REDUCTION INTERNAL FIXATION TIBIAL  FRACTURE;  Surgeon: Javier Docker, MD;  Location: [facility] ORS;  Service: Orthopedics;  Laterality: Right;    REVIEW OF SYSTEMS:  Pertinent items are noted in HPI.   PHYSICAL  EXAMINATION: General appearance: alert, cooperative and no distress Head: Normocephalic, without obvious abnormality, atraumatic Neck: no adenopathy Lymph nodes: Cervical, supraclavicular, and axillary nodes normal. Resp: clear to auscultation bilaterally Cardio: regular rate and rhythm, S1, S2 normal, no murmur, click, rub or gallop GI: soft, non-tender; bowel sounds normal; no masses,  no organomegaly Extremities: Right lower extremity with significantly decreased erythema except when dependant, slight warmth remains and  there is 1+ soft tissue swelling, left lower extremity is without edema or erythema Neurologic: Alert and oriented X 3, normal strength and tone. Normal symmetric reflexes. Normal coordination and gait  ECOG PERFORMANCE STATUS: 1 - Symptomatic but completely ambulatory  There were no vitals taken for this visit.  LABORATORY DATA: Lab Results  Component Value Date   WBC 15.7* 07/27/2013   HGB 11.1* 07/27/2013   HCT 33.6* 07/27/2013   MCV 80.4  07/27/2013   PLT 494* 07/27/2013      Chemistry      Component Value Date/Time   NA 136 07/27/2013 0806   NA 134* 07/04/2013 0455   NA 138 03/25/2012 1225   K 3.8 07/27/2013 0806   K 4.2 07/04/2013 0455   K 4.2 03/25/2012 1225   CL 96 07/04/2013 0455   CL 105 03/03/2013 1002   CL 98 03/25/2012 1225   CO2 23 07/27/2013 0806   CO2 32 07/04/2013 0455   CO2 26 03/25/2012 1225   BUN 8.8 07/27/2013 0806   BUN 20 07/04/2013 0455   BUN 11 03/25/2012 1225   CREATININE 0.8 07/27/2013 0806   CREATININE 0.82 07/04/2013 0455   CREATININE 1.0 03/25/2012 1225      Component Value Date/Time   CALCIUM 9.1 07/27/2013 0806   CALCIUM 9.1 07/04/2013 0455   CALCIUM 8.6 03/25/2012 1225   ALKPHOS 100 07/27/2013 0806   ALKPHOS 51 07/02/2013 0455   ALKPHOS 84 03/25/2012 1225   AST 18 07/27/2013 0806   AST 23 07/02/2013 0455   AST 33 03/25/2012 1225   ALT 25 07/27/2013 0806   ALT 34 07/02/2013 0455   ALT 43 03/25/2012 1225   BILITOT 0.20 07/27/2013 0806    BILITOT 0.5 07/02/2013 0455   BILITOT 0.80 03/25/2012 1225       RADIOGRAPHIC STUDIES: No results found.  ASSESSMENT AND PLAN: This is a very pleasant 57 years old white male recently diagnosed with a stage IIb non-small cell lung cancer, adenocarcinoma status post left lower lobectomy. The patient is status post 1 cycle of adjuvant chemotherapy with cisplatin and Alimta. Patient was discussed with also seen by Dr. Arbutus Ped. His right lower extremity cellulitis has improved. He is to complete his course of antibiotics as prescribed by his orthopedist. His counts were reviewed and are all in treatable range. He will proceed with cycle #2 of his adjuvant chemotherapy with cisplatin and Alimta. He will continue with weekly labs.  He will return in 3 weeks prior to cycle #3 of his adjuvant chemotherapy.   Laural Benes, Zari Cly E, PA-C  He was advised to call immediately if he has any concerning symptoms in the interval. The patient voices understanding of current disease status and treatment options and is in agreement with the current care plan.  All questions were answered. The patient knows to call the clinic with any problems, questions or concerns. We can certainly see the patient much sooner if necessary.  ADDENDUM:  Hematology/Oncology Attending:  I have a face to face encounter with the patient during his visit. I recommended his care plan. This is a very pleasant 57 years old white male with recent diagnosis of stage IIb non-small cell lung cancer, adenocarcinoma status post resection and currently undergoing adjuvant chemotherapy with cisplatin and Alimta status post 1 cycle. The second cycle was delayed secondary to a cellulitis of the right foot. He was treated with a course of antibiotics and feeling much better today. We'll proceed with cycle #2 of chemotherapy today as scheduled. The patient would come back for follow up visit in 3 weeks with the next cycle of his chemotherapy. He was  advised to call immediately if he has any concerning symptoms in the interval. Lajuana Matte., MD 08/01/2013

## 2013-07-27 NOTE — Patient Instructions (Addendum)
Continue weekly labs as scheduled Follow up in 3 weeks prior to the start of your next cycle of chemotherapy

## 2013-07-27 NOTE — Patient Instructions (Addendum)
[facility] Discharge Instructions for Patients Receiving Chemotherapy  Today you received the following chemotherapy agents alimta and cisplatin.  To help prevent nausea and vomiting after your treatment, we encourage you to take your nausea medication compazine.   If you develop nausea and vomiting that is not controlled by your nausea medication, call the clinic.   BELOW ARE SYMPTOMS THAT SHOULD BE REPORTED IMMEDIATELY:  *FEVER GREATER THAN 100.5 F  *CHILLS WITH OR WITHOUT FEVER  NAUSEA AND VOMITING THAT IS NOT CONTROLLED WITH YOUR NAUSEA MEDICATION  *UNUSUAL SHORTNESS OF BREATH  *UNUSUAL BRUISING OR BLEEDING  TENDERNESS IN MOUTH AND THROAT WITH OR WITHOUT PRESENCE OF ULCERS  *URINARY PROBLEMS  *BOWEL PROBLEMS  UNUSUAL RASH Items with * indicate a potential emergency and should be followed up as soon as possible.  Feel free to call the clinic you have any questions or concerns. The clinic phone number is [phone].

## 2013-07-27 NOTE — Progress Notes (Signed)
Patient voided 625cc pre-cisplatin and voided 1175cc post cisplatin  For a total of 1800cc.

## 2013-07-28 ENCOUNTER — Telehealth: Payer: Self-pay | Admitting: *Deleted

## 2013-07-28 NOTE — Telephone Encounter (Signed)
Per staff message and POF I have scheduled appts.  JMW  

## 2013-07-31 ENCOUNTER — Other Ambulatory Visit: Payer: Self-pay | Admitting: Internal Medicine

## 2013-07-31 ENCOUNTER — Other Ambulatory Visit: Payer: Self-pay | Admitting: Thoracic Surgery (Cardiothoracic Vascular Surgery)

## 2013-08-02 ENCOUNTER — Other Ambulatory Visit: Payer: Self-pay | Admitting: *Deleted

## 2013-08-02 ENCOUNTER — Encounter: Payer: Self-pay | Admitting: Internal Medicine

## 2013-08-02 ENCOUNTER — Other Ambulatory Visit: Payer: Self-pay | Admitting: Internal Medicine

## 2013-08-02 DIAGNOSIS — G8918 Other acute postprocedural pain: Secondary | ICD-10-CM

## 2013-08-02 MED ORDER — HYDROCODONE-ACETAMINOPHEN 5-325 MG PO TABS: 1.0000 | ORAL_TABLET | Freq: Four times a day (QID) | ORAL | Status: DC | PRN

## 2013-08-03 ENCOUNTER — Other Ambulatory Visit: Payer: BC Managed Care – PPO | Admitting: Lab

## 2013-08-03 ENCOUNTER — Telehealth: Payer: Self-pay | Admitting: *Deleted

## 2013-08-03 ENCOUNTER — Encounter: Payer: Self-pay | Admitting: Internal Medicine

## 2013-08-03 NOTE — Telephone Encounter (Signed)
Weekly lab work from 08/03/13 CBC/CMET given to Dr Donnald Garre to review.  SLJ

## 2013-08-03 NOTE — Telephone Encounter (Signed)
Weekly lab work (CBC/ CMET) orders faxed to [facility] [phone].  SLJ

## 2013-08-09 ENCOUNTER — Other Ambulatory Visit: Payer: BC Managed Care – PPO | Admitting: Lab

## 2013-08-09 ENCOUNTER — Ambulatory Visit: Payer: BC Managed Care – PPO

## 2013-08-13 ENCOUNTER — Encounter: Payer: Self-pay | Admitting: Internal Medicine

## 2013-08-16 ENCOUNTER — Other Ambulatory Visit (HOSPITAL_BASED_OUTPATIENT_CLINIC_OR_DEPARTMENT_OTHER): Payer: BC Managed Care – PPO | Admitting: Lab

## 2013-08-16 ENCOUNTER — Telehealth: Payer: Self-pay | Admitting: Internal Medicine

## 2013-08-16 ENCOUNTER — Ambulatory Visit (HOSPITAL_BASED_OUTPATIENT_CLINIC_OR_DEPARTMENT_OTHER): Payer: BC Managed Care – PPO | Admitting: Internal Medicine

## 2013-08-16 ENCOUNTER — Ambulatory Visit (HOSPITAL_BASED_OUTPATIENT_CLINIC_OR_DEPARTMENT_OTHER): Payer: BC Managed Care – PPO

## 2013-08-16 ENCOUNTER — Encounter: Payer: Self-pay | Admitting: Internal Medicine

## 2013-08-16 ENCOUNTER — Telehealth: Payer: Self-pay | Admitting: *Deleted

## 2013-08-16 VITALS — BP 135/74 | HR 109 | Temp 98.0°F | Resp 19 | Ht 73.0 in | Wt 239.7 lb

## 2013-08-16 DIAGNOSIS — G8918 Other acute postprocedural pain: Secondary | ICD-10-CM

## 2013-08-16 DIAGNOSIS — C3430 Malignant neoplasm of lower lobe, unspecified bronchus or lung: Secondary | ICD-10-CM

## 2013-08-16 DIAGNOSIS — C3492 Malignant neoplasm of unspecified part of left bronchus or lung: Secondary | ICD-10-CM

## 2013-08-16 DIAGNOSIS — Z5111 Encounter for antineoplastic chemotherapy: Secondary | ICD-10-CM

## 2013-08-16 LAB — CBC WITH DIFFERENTIAL/PLATELET
Basophils Absolute: 0 10*3/uL (ref 0.0–0.1)
Eosinophils Absolute: 0 10*3/uL (ref 0.0–0.5)
HGB: 12 g/dL — ABNORMAL LOW (ref 13.0–17.1)
LYMPH%: 16.6 % (ref 14.0–49.0)
MCV: 81.7 fL (ref 79.3–98.0)
MONO#: 0.2 10*3/uL (ref 0.1–0.9)
MONO%: 4.8 % (ref 0.0–14.0)
NEUT#: 3.7 10*3/uL (ref 1.5–6.5)
Platelets: 487 10*3/uL — ABNORMAL HIGH (ref 140–400)
RDW: 17.5 % — ABNORMAL HIGH (ref 11.0–14.6)

## 2013-08-16 LAB — COMPREHENSIVE METABOLIC PANEL (CC13)
Albumin: 3.6 g/dL (ref 3.5–5.0)
Alkaline Phosphatase: 101 U/L (ref 40–150)
BUN: 7 mg/dL (ref 7.0–26.0)
CO2: 23 mEq/L (ref 22–29)
Glucose: 191 mg/dl — ABNORMAL HIGH (ref 70–140)
Potassium: 4.3 mEq/L (ref 3.5–5.1)

## 2013-08-16 MED ORDER — PALONOSETRON HCL INJECTION 0.25 MG/5ML
INTRAVENOUS | Status: AC
  Filled 2013-08-16: qty 5

## 2013-08-16 MED ORDER — PALONOSETRON HCL INJECTION 0.25 MG/5ML
0.2500 mg | Freq: Once | INTRAVENOUS | Status: AC
  Administered 2013-08-16: 0.25 mg via INTRAVENOUS

## 2013-08-16 MED ORDER — HYDROCODONE-ACETAMINOPHEN 5-325 MG PO TABS: 1.0000 | ORAL_TABLET | Freq: Four times a day (QID) | ORAL | Status: DC | PRN

## 2013-08-16 MED ORDER — DEXAMETHASONE SODIUM PHOSPHATE 20 MG/5ML IJ SOLN
INTRAMUSCULAR | Status: AC
  Filled 2013-08-16: qty 5

## 2013-08-16 MED ORDER — SODIUM CHLORIDE 0.9 % IV SOLN
Freq: Once | INTRAVENOUS | Status: AC
  Administered 2013-08-16: 11:00:00 via INTRAVENOUS

## 2013-08-16 MED ORDER — SODIUM CHLORIDE 0.9 % IV SOLN
150.0000 mg | Freq: Once | INTRAVENOUS | Status: AC
  Administered 2013-08-16: 150 mg via INTRAVENOUS
  Filled 2013-08-16: qty 5

## 2013-08-16 MED ORDER — CISPLATIN CHEMO INJECTION 100MG/100ML
75.0000 mg/m2 | Freq: Once | INTRAVENOUS | Status: AC
  Administered 2013-08-16: 179 mg via INTRAVENOUS
  Filled 2013-08-16: qty 179

## 2013-08-16 MED ORDER — CYANOCOBALAMIN 1000 MCG/ML IJ SOLN
1000.0000 ug | Freq: Once | INTRAMUSCULAR | Status: AC
  Administered 2013-08-16: 1000 ug via INTRAMUSCULAR

## 2013-08-16 MED ORDER — CYANOCOBALAMIN 1000 MCG/ML IJ SOLN
INTRAMUSCULAR | Status: AC
  Filled 2013-08-16: qty 1

## 2013-08-16 MED ORDER — SODIUM CHLORIDE 0.9 % IV SOLN
500.0000 mg/m2 | Freq: Once | INTRAVENOUS | Status: AC
  Administered 2013-08-16: 1200 mg via INTRAVENOUS
  Filled 2013-08-16: qty 48

## 2013-08-16 MED ORDER — DEXAMETHASONE SODIUM PHOSPHATE 20 MG/5ML IJ SOLN
12.0000 mg | Freq: Once | INTRAMUSCULAR | Status: AC
  Administered 2013-08-16: 12 mg via INTRAVENOUS

## 2013-08-16 MED ORDER — POTASSIUM CHLORIDE 2 MEQ/ML IV SOLN
Freq: Once | INTRAVENOUS | Status: AC
  Administered 2013-08-16: 11:00:00 via INTRAVENOUS
  Filled 2013-08-16: qty 10

## 2013-08-16 NOTE — Telephone Encounter (Signed)
Per staff message and POF I have scheduled appts.  JMW  

## 2013-08-16 NOTE — Patient Instructions (Signed)
CURRENT THERAPY: Adjuvant chemotherapy with cisplatin 75 mg/M2 and Alimta 500 mg/M2 every 3 weeks, first dose given 06/28/2013. Status post 2 cycles.  CHEMOTHERAPY INTENT: Adjuvant/Curative.  CURRENT # OF CHEMOTHERAPY CYCLES: 3  CURRENT ANTIEMETICS: N/A  CURRENT SMOKING STATUS: Quit smoking 05/02/13  ORAL CHEMOTHERAPY AND CONSENT: None  CURRENT BISPHOSPHONATES USE: None  PAIN MANAGEMENT: 2/10  NARCOTICS INDUCED CONSTIPATION: None  LIVING WILL AND CODE STATUS: Full Code

## 2013-08-16 NOTE — Patient Instructions (Addendum)
[facility] Discharge Instructions for Patients Receiving Chemotherapy  Today you received the following chemotherapy agents :  Alimta, Cisplatin, vit B 12 To help prevent nausea and vomiting after your treatment, we encourage you to take your nausea medication   If you develop nausea and vomiting that is not controlled by your nausea medication, call the clinic.   BELOW ARE SYMPTOMS THAT SHOULD BE REPORTED IMMEDIATELY:  *FEVER GREATER THAN 100.5 F  *CHILLS WITH OR WITHOUT FEVER  NAUSEA AND VOMITING THAT IS NOT CONTROLLED WITH YOUR NAUSEA MEDICATION  *UNUSUAL SHORTNESS OF BREATH  *UNUSUAL BRUISING OR BLEEDING  TENDERNESS IN MOUTH AND THROAT WITH OR WITHOUT PRESENCE OF ULCERS  *URINARY PROBLEMS  *BOWEL PROBLEMS  UNUSUAL RASH Items with * indicate a potential emergency and should be followed up as soon as possible.  Feel free to call the clinic you have any questions or concerns. The clinic phone number is [phone].

## 2013-08-16 NOTE — Progress Notes (Signed)
[facility] Telephone:(336) [phone]   Fax:([phone]  OFFICE PROGRESS NOTE  Ailene Ravel, MD Dr. 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  2) stage IIB (T3, N1, M0) non-small cell lung cancer, adenocarcinoma diagnosed in June of 2014.   PRIOR THERAPY:  1) Status post 3 cycles of systemic chemotherapy with carboplatin and etoposide. Last dose was given on 07/16/2011.  2) Left video-assisted thoracoscopy, mini thoracotomy; wedge, posterior segment of left upper lobe; wedge, anterior segment of left lower lobe with node dissection.  3) Redo left video-assisted thoracoscopy, lysis of adhesions, wedge resection of left lower lobe, thoracoscopic left lower lobectomy under the care of Dr. Dorris Fetch on 05/03/2013.   CURRENT THERAPY: Adjuvant chemotherapy with cisplatin 75 mg/M2 and Alimta 500 mg/M2 every 3 weeks, first dose given 06/28/2013. Status post 2 cycles.  CHEMOTHERAPY INTENT: Adjuvant/Curative.  CURRENT # OF CHEMOTHERAPY CYCLES: 3  CURRENT ANTIEMETICS: N/A  CURRENT SMOKING STATUS: Quit smoking 05/02/13  ORAL CHEMOTHERAPY AND CONSENT: None  CURRENT BISPHOSPHONATES USE: None  PAIN MANAGEMENT: 2/10  NARCOTICS INDUCED CONSTIPATION: None  LIVING WILL AND CODE STATUS: Full Code  INTERVAL HISTORY: [name] [name] 57 y.o. male returns to the clinic today for followup visit accompanied by his wife. The patient is feeling fine today with no specific complaints. He has complete resolution of the cellulitis of the right foot. The patient denied having any fever or chills. He denied having any shortness of breath, cough or hemoptysis but continues to have persistent pain on the left side of his chest after the surgical resection. He has no weight loss or night sweats. He tolerated cycle #2 of his adjuvant chemotherapy  fairly well.  MEDICAL HISTORY: Past Medical History  Diagnosis Date  . Hypercholesteremia     takes Pravastatin nightly  . Lung cancer   . HTN (hypertension)     takes Lisinopril and HCTZ daily  . Shortness of breath     with exertion;Albuterol inhaler prn  . Insomnia     takes Xanax prn    ALLERGIES:  has No Known Allergies.  MEDICATIONS:  Current Outpatient Prescriptions  Medication Sig Dispense Refill  . albuterol (PROVENTIL HFA;VENTOLIN HFA) 108 (90 BASE) MCG/ACT inhaler Inhale 1-2 puffs into the lungs every 4 (four) hours as needed for wheezing.      Marland Kitchen ALPRAZolam (XANAX) 0.25 MG tablet Take 1 tablet (0.25 mg total) by mouth at bedtime as needed for sleep.  65 tablet  0  . aspirin EC 81 MG tablet Take 81 mg by mouth every morning.      Marland Kitchen dexamethasone (DECADRON) 4 MG tablet 4 Milligram by mouth twice a day the day before, day of and day after the chemotherapy every three-week  30 tablet  0  . folic acid (FOLVITE) 1 MG tablet Take 1 mg by mouth every morning.      . gabapentin (NEURONTIN) 100 MG capsule Take 1 capsule (100 mg total) by mouth 3 (three) times daily.  90 capsule  1  . lisinopril-hydrochlorothiazide (PRINZIDE,ZESTORETIC) 20-25 MG per tablet Take 1 tablet by mouth every morning.       . pravastatin (PRAVACHOL) 40 MG tablet Take 40 mg by mouth at bedtime.       Marland Kitchen PRESCRIPTION MEDICATION He receives his treatments at the [facility] and his oncologist  is Dr. Arbutus Ped. He is currently receiving Alimta and Cisplatin every 3 weeks. He received his first dose on 06/28/13.      Marland Kitchen zolpidem (AMBIEN) 5 MG tablet Take 1 tablet (5 mg total) by mouth at bedtime as needed and may repeat dose one time if needed for sleep.  30 tablet  1   No current facility-administered medications for this visit.    SURGICAL HISTORY:  Past Surgical History  Procedure Laterality Date  . Left video-assisted thoracoscopy, mini thoracotomy; wedge, posterior segment of left upper lobe;  wedge, anterior segment of left lower lobe with node dissection.  09/02/11    BURNEY  . Video assisted thoracoscopy Left 05/03/2013    Procedure: REDO VIDEO ASSISTED THORACOSCOPY;  Surgeon: Loreli Slot, MD;  Location: [facility] OR;  Service: Thoracic;  Laterality: Left;  REDO LEFT VATS  . Thoracotomy Left 05/03/2013    Procedure: THORACOTOMY MAJOR;  Surgeon: Loreli Slot, MD;  Location: [facility] OR;  Service: Thoracic;  Laterality: Left;  . Lobectomy Left 05/03/2013    Procedure: LOBECTOMY;  Surgeon: Loreli Slot, MD;  Location: [facility] OR;  Service: Thoracic;  Laterality: Left;  . Wedge resection Left 05/03/2013    Procedure: WEDGE RESECTION;  Surgeon: Loreli Slot, MD;  Location: [facility] OR;  Service: Thoracic;  Laterality: Left;  . Open reduction internal fixation (orif) tibia/fibula fracture Right 07/01/2013    Procedure: OPEN REDUCTION INTERNAL FIXATION TIBIAL  FRACTURE;  Surgeon: Javier Docker, MD;  Location: [facility] ORS;  Service: Orthopedics;  Laterality: Right;    REVIEW OF SYSTEMS:  A comprehensive review of systems was negative except for: Constitutional: positive for fatigue Musculoskeletal: positive for Left chest wall pain   PHYSICAL EXAMINATION: General appearance: alert, cooperative, fatigued and no distress Head: Normocephalic, without obvious abnormality, atraumatic Neck: no adenopathy, no JVD, supple, symmetrical, trachea midline and thyroid not enlarged, symmetric, no tenderness/mass/nodules Lymph nodes: Cervical, supraclavicular, and axillary nodes normal. Resp: clear to auscultation bilaterally Back: symmetric, no curvature. ROM normal. No CVA tenderness. Cardio: regular rate and rhythm, S1, S2 normal, no murmur, click, rub or gallop GI: soft, non-tender; bowel sounds normal; no masses,  no organomegaly Extremities: extremities normal, atraumatic, no cyanosis or edema  ECOG PERFORMANCE STATUS: 1 - Symptomatic but completely ambulatory  Blood pressure 135/74, pulse  109, temperature 98 F (36.7 C), temperature source Oral, resp. rate 19, height 6\' 1"  (1.854 m), weight 239 lb 11.2 oz (108.727 kg), SpO2 98.00%.  LABORATORY DATA: Lab Results  Component Value Date   WBC 4.7 08/16/2013   HGB 12.0* 08/16/2013   HCT 36.3* 08/16/2013   MCV 81.7 08/16/2013   PLT 487* 08/16/2013      Chemistry      Component Value Date/Time   NA 136 07/27/2013 0806   NA 134* 07/04/2013 0455   NA 138 03/25/2012 1225   K 3.8 07/27/2013 0806   K 4.2 07/04/2013 0455   K 4.2 03/25/2012 1225   CL 96 07/04/2013 0455   CL 105 03/03/2013 1002   CL 98 03/25/2012 1225   CO2 23 07/27/2013 0806   CO2 32 07/04/2013 0455   CO2 26 03/25/2012 1225   BUN 8.8 07/27/2013 0806   BUN 20 07/04/2013 0455   BUN 11 03/25/2012 1225   CREATININE 0.8 07/27/2013 0806   CREATININE 0.82 07/04/2013 0455   CREATININE 1.0 03/25/2012 1225      Component Value Date/Time   CALCIUM 9.1 07/27/2013 0806   CALCIUM 9.1 07/04/2013 0455  CALCIUM 8.6 03/25/2012 1225   ALKPHOS 100 07/27/2013 0806   ALKPHOS 51 07/02/2013 0455   ALKPHOS 84 03/25/2012 1225   AST 18 07/27/2013 0806   AST 23 07/02/2013 0455   AST 33 03/25/2012 1225   ALT 25 07/27/2013 0806   ALT 34 07/02/2013 0455   ALT 43 03/25/2012 1225   BILITOT 0.20 07/27/2013 0806   BILITOT 0.5 07/02/2013 0455   BILITOT 0.80 03/25/2012 1225       RADIOGRAPHIC STUDIES: Dg Chest 2 View  07/27/2013   CLINICAL DATA:  57 year old male with left lung cancer.  EXAM: CHEST  2 VIEW  COMPARISON:  07/01/2013 and earlier.  FINDINGS: Postoperative changes in the left lung with volume loss. Interval improved residual left lung ventilation. Mild nodular opacity along staple lines in the left lung not significantly changed. Stable cardiac size and mediastinal contours. Visualized tracheal air column is within normal limits. The right lung is clear. No pleural effusion. No acute osseous abnormality identified. Left lateral rib likely postoperative changes.  IMPRESSION: Postoperative changes to the  left lung with interval improved ventilation. Continued attention directed on follow up exams to residual nodular opacity along the left lung staple lines.   Electronically Signed   By: Augusto Gamble M.D.   On: 07/27/2013 09:21    ASSESSMENT AND PLAN: This is a very pleasant 57 years old white male with recently diagnosed as stage IIB non-small cell lung cancer, adenocarcinoma status post resection and currently undergoing adjuvant chemotherapy with cisplatin and Alimta status post 2 cycles. He tolerated the second cycle of his treatment fairly well. We'll proceed with cycle #3 today as scheduled. The patient would come back for followup visit in 3 weeks with the next cycle of his treatment. He was advised to call immediately if he has any concerning symptoms in the interval. He was given a refill for Vicodin for pain control. The patient voices understanding of current disease status and treatment options and is in agreement with the current care plan.  All questions were answered. The patient knows to call the clinic with any problems, questions or concerns. We can certainly see the patient much sooner if necessary.

## 2013-08-16 NOTE — Telephone Encounter (Signed)
Gave pt appt for lab, Ml and chemo, for October , pt will draw weekely labs @ home with [facility]  °

## 2013-08-23 ENCOUNTER — Other Ambulatory Visit: Payer: BC Managed Care – PPO | Admitting: Lab

## 2013-08-25 ENCOUNTER — Encounter: Payer: Self-pay | Admitting: Internal Medicine

## 2013-08-30 ENCOUNTER — Ambulatory Visit: Payer: BC Managed Care – PPO

## 2013-09-02 ENCOUNTER — Other Ambulatory Visit: Payer: Self-pay | Admitting: Internal Medicine

## 2013-09-03 ENCOUNTER — Other Ambulatory Visit: Payer: Self-pay | Admitting: *Deleted

## 2013-09-03 MED ORDER — ALPRAZOLAM 0.25 MG PO TABS: 0.2500 mg | ORAL_TABLET | Freq: Every evening | ORAL | Status: DC | PRN

## 2013-09-03 NOTE — Telephone Encounter (Signed)
Message came through my chart on accident Person is not a patient with our facility Called my Tandon and advised him or the error He is aware and will have his wife send the message  To Dr Sofie Hartigan office

## 2013-09-06 ENCOUNTER — Telehealth: Payer: Self-pay | Admitting: Internal Medicine

## 2013-09-06 ENCOUNTER — Encounter: Payer: Self-pay | Admitting: Internal Medicine

## 2013-09-06 ENCOUNTER — Encounter: Payer: Self-pay | Admitting: Physician Assistant

## 2013-09-06 ENCOUNTER — Ambulatory Visit (HOSPITAL_BASED_OUTPATIENT_CLINIC_OR_DEPARTMENT_OTHER): Payer: BC Managed Care – PPO | Admitting: Physician Assistant

## 2013-09-06 ENCOUNTER — Ambulatory Visit (HOSPITAL_BASED_OUTPATIENT_CLINIC_OR_DEPARTMENT_OTHER): Payer: BC Managed Care – PPO

## 2013-09-06 ENCOUNTER — Other Ambulatory Visit (HOSPITAL_BASED_OUTPATIENT_CLINIC_OR_DEPARTMENT_OTHER): Payer: BC Managed Care – PPO

## 2013-09-06 VITALS — BP 125/76 | HR 80 | Temp 97.0°F | Resp 20 | Ht 73.0 in | Wt 242.4 lb

## 2013-09-06 DIAGNOSIS — C3430 Malignant neoplasm of lower lobe, unspecified bronchus or lung: Secondary | ICD-10-CM

## 2013-09-06 DIAGNOSIS — Z5111 Encounter for antineoplastic chemotherapy: Secondary | ICD-10-CM

## 2013-09-06 DIAGNOSIS — C3492 Malignant neoplasm of unspecified part of left bronchus or lung: Secondary | ICD-10-CM

## 2013-09-06 LAB — COMPREHENSIVE METABOLIC PANEL (CC13)
ALT: 21 U/L (ref 0–55)
Albumin: 3.7 g/dL (ref 3.5–5.0)
Alkaline Phosphatase: 98 U/L (ref 40–150)
Anion Gap: 11 mEq/L (ref 3–11)
CO2: 18 mEq/L — ABNORMAL LOW (ref 22–29)
Glucose: 127 mg/dl (ref 70–140)
Potassium: 4.5 mEq/L (ref 3.5–5.1)
Sodium: 134 mEq/L — ABNORMAL LOW (ref 136–145)
Total Protein: 8.1 g/dL (ref 6.4–8.3)

## 2013-09-06 LAB — CBC WITH DIFFERENTIAL/PLATELET
Basophils Absolute: 0 10*3/uL (ref 0.0–0.1)
EOS%: 0 % (ref 0.0–7.0)
Eosinophils Absolute: 0 10*3/uL (ref 0.0–0.5)
HGB: 11.1 g/dL — ABNORMAL LOW (ref 13.0–17.1)
MCV: 81.5 fL (ref 79.3–98.0)
MONO%: 10.6 % (ref 0.0–14.0)
NEUT#: 5.8 10*3/uL (ref 1.5–6.5)
NEUT%: 72.9 % (ref 39.0–75.0)
RDW: 15.9 % — ABNORMAL HIGH (ref 11.0–14.6)
lymph#: 1.3 10*3/uL (ref 0.9–3.3)
nRBC: 0 % (ref 0–0)

## 2013-09-06 MED ORDER — DEXAMETHASONE SODIUM PHOSPHATE 20 MG/5ML IJ SOLN
12.0000 mg | Freq: Once | INTRAMUSCULAR | Status: AC
  Administered 2013-09-06: 12 mg via INTRAVENOUS

## 2013-09-06 MED ORDER — DEXAMETHASONE SODIUM PHOSPHATE 20 MG/5ML IJ SOLN
INTRAMUSCULAR | Status: AC
  Filled 2013-09-06: qty 5

## 2013-09-06 MED ORDER — SODIUM CHLORIDE 0.9 % IV SOLN
Freq: Once | INTRAVENOUS | Status: AC
  Administered 2013-09-06: 11:00:00 via INTRAVENOUS

## 2013-09-06 MED ORDER — SODIUM CHLORIDE 0.9 % IV SOLN
150.0000 mg | Freq: Once | INTRAVENOUS | Status: AC
  Administered 2013-09-06: 150 mg via INTRAVENOUS
  Filled 2013-09-06: qty 5

## 2013-09-06 MED ORDER — HEPARIN SOD (PORK) LOCK FLUSH 100 UNIT/ML IV SOLN
500.0000 [IU] | Freq: Once | INTRAVENOUS | Status: DC | PRN
Start: 2013-09-06 — End: 2013-09-06
  Filled 2013-09-06: qty 5

## 2013-09-06 MED ORDER — POTASSIUM CHLORIDE 2 MEQ/ML IV SOLN
Freq: Once | INTRAVENOUS | Status: AC
  Administered 2013-09-06: 11:00:00 via INTRAVENOUS
  Filled 2013-09-06: qty 10

## 2013-09-06 MED ORDER — OXYCODONE-ACETAMINOPHEN 5-325 MG PO TABS: 1.0000 | ORAL_TABLET | Freq: Four times a day (QID) | ORAL | Status: DC | PRN

## 2013-09-06 MED ORDER — SODIUM CHLORIDE 0.9 % IV SOLN
500.0000 mg/m2 | Freq: Once | INTRAVENOUS | Status: AC
  Administered 2013-09-06: 1200 mg via INTRAVENOUS
  Filled 2013-09-06: qty 48

## 2013-09-06 MED ORDER — PALONOSETRON HCL INJECTION 0.25 MG/5ML
0.2500 mg | Freq: Once | INTRAVENOUS | Status: AC
  Administered 2013-09-06: 0.25 mg via INTRAVENOUS

## 2013-09-06 MED ORDER — SODIUM CHLORIDE 0.9 % IJ SOLN
10.0000 mL | INTRAMUSCULAR | Status: DC | PRN
  Filled 2013-09-06: qty 10

## 2013-09-06 MED ORDER — PALONOSETRON HCL INJECTION 0.25 MG/5ML
INTRAVENOUS | Status: AC
  Filled 2013-09-06: qty 5

## 2013-09-06 MED ORDER — SODIUM CHLORIDE 0.9 % IV SOLN
75.5000 mg/m2 | Freq: Once | INTRAVENOUS | Status: AC
  Administered 2013-09-06: 180 mg via INTRAVENOUS
  Filled 2013-09-06: qty 180

## 2013-09-06 NOTE — Progress Notes (Addendum)
[facility] [phone]   [phone]  SHARED VISIT PROGRESS NOTE  Ailene Ravel, MD Dr. Sharman Crate Hamrick [address] 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  2) stage IIB (T3, N1, M0) non-small cell lung cancer, adenocarcinoma diagnosed in June of 2014.   PRIOR THERAPY:  1) Status post 3 cycles of systemic chemotherapy with carboplatin and etoposide. Last dose was given on 07/16/2011.  2) Left video-assisted thoracoscopy, mini thoracotomy; wedge, posterior segment of left upper lobe; wedge, anterior segment of left lower lobe with node dissection.  3) Redo left video-assisted thoracoscopy, lysis of adhesions, wedge resection of left lower lobe, thoracoscopic left lower lobectomy under the care of Dr. Dorris Fetch on 05/03/2013.   CURRENT THERAPY: Adjuvant chemotherapy with cisplatin 75 mg/M2 and Alimta 500 mg/M2 every 3 weeks, first dose given 06/28/2013. Status post 3 cycles.  CHEMOTHERAPY INTENT: Adjuvant/Curative.  CURRENT # OF CHEMOTHERAPY CYCLES: 4  CURRENT ANTIEMETICS: N/A  CURRENT SMOKING STATUS: Quit smoking 05/02/13  ORAL CHEMOTHERAPY AND CONSENT: None  CURRENT BISPHOSPHONATES USE: None  PAIN MANAGEMENT: 2/10  NARCOTICS INDUCED CONSTIPATION: None  LIVING WILL AND CODE STATUS: Full Code  INTERVAL HISTORY: [name] [name] 57 y.o. male returns to the clinic today for followup visit accompanied by his wife. The patient is feeling fine today with no specific complaints. He has complete resolution of the cellulitis of the right foot. He has begun all doing some walking without his walker he but will notice some increased lower extremity swelling. He continues to have to have pain in the left lateral chest wall and left lower rib cage area stating that this area hurts" feels numb". Of note he said 2 surgical procedures in  this region. He states that the Vicodin even taking 2 tablets at once is not helping control his pain. He had some Percocet prescribed to him in the past and found this to be more helpful. He also requests a refill for his Xanax 0.25 mg tablets such that he can take it twice daily. He finds that this helps him significantly with his anxiety level. He reports that he had a significant amount of nausea with his last chemotherapy cycle and felt bad for approximately 10 days or so. He did not take any of his antiemetic tablets. The patient denied having any fever or chills. He denied having any shortness of breath, cough or hemoptysis.  He has no weight loss or night sweats. He presents to proceed with cycle #4 of his adjuvant chemotherapy with cisplatin and Alimta.   MEDICAL HISTORY: Past Medical History  Diagnosis Date  . Hypercholesteremia     takes Pravastatin nightly  . Lung cancer   . HTN (hypertension)     takes Lisinopril and HCTZ daily  . Shortness of breath     with exertion;Albuterol inhaler prn  . Insomnia     takes Xanax prn    ALLERGIES:  has No Known Allergies.  MEDICATIONS:  Current Outpatient Prescriptions  Medication Sig Dispense Refill  . albuterol (PROVENTIL HFA;VENTOLIN HFA) 108 (90 BASE) MCG/ACT inhaler Inhale 1-2 puffs into the lungs every 4 (four) hours as needed for wheezing.      Marland Kitchen ALPRAZolam (XANAX) 0.25 MG tablet Take 1 tablet (0.25 mg total) by mouth at bedtime as needed for sleep.  30 tablet  0  . aspirin EC 81  MG tablet Take 81 mg by mouth every morning.      Marland Kitchen dexamethasone (DECADRON) 4 MG tablet 4 Milligram by mouth twice a day the day before, day of and day after the chemotherapy every three-week  30 tablet  0  . folic acid (FOLVITE) 1 MG tablet Take 1 mg by mouth every morning.      . gabapentin (NEURONTIN) 100 MG capsule Take 1 capsule (100 mg total) by mouth 3 (three) times daily.  90 capsule  1  . HYDROcodone-acetaminophen (NORCO) 5-325 MG per tablet Take  1 tablet by mouth every 6 (six) hours as needed for pain.  60 tablet  0  . lisinopril-hydrochlorothiazide (PRINZIDE,ZESTORETIC) 20-25 MG per tablet Take 1 tablet by mouth every morning.       Marland Kitchen oxyCODONE-acetaminophen (PERCOCET/ROXICET) 5-325 MG per tablet       . oxyCODONE-acetaminophen (PERCOCET/ROXICET) 5-325 MG per tablet Take 1 tablet by mouth every 6 (six) hours as needed for pain.  60 tablet  0  . pravastatin (PRAVACHOL) 40 MG tablet Take 40 mg by mouth at bedtime.       Marland Kitchen PRESCRIPTION MEDICATION He receives his treatments at the [facility] and his oncologist is Dr. Arbutus Ped. He is currently receiving Alimta and Cisplatin every 3 weeks. He received his first dose on 06/28/13.      Marland Kitchen prochlorperazine (COMPAZINE) 10 MG tablet       . zolpidem (AMBIEN) 5 MG tablet Take 1 tablet (5 mg total) by mouth at bedtime as needed and may repeat dose one time if needed for sleep.  30 tablet  1   No current facility-administered medications for this visit.   Facility-Administered Medications Ordered in Other Visits  Medication Dose Route Frequency Provider Last Rate Last Dose  . heparin lock flush 100 unit/mL  500 Units Intracatheter Once PRN Yesli Vanderhoff E Lamel Mccarley, PA-C      . sodium chloride 0.9 % injection 10 mL  10 mL Intracatheter PRN Conni Slipper, PA-C        SURGICAL HISTORY:  Past Surgical History  Procedure Laterality Date  . Left video-assisted thoracoscopy, mini thoracotomy; wedge, posterior segment of left upper lobe; wedge, anterior segment of left lower lobe with node dissection.  09/02/11    BURNEY  . Video assisted thoracoscopy Left 05/03/2013    Procedure: REDO VIDEO ASSISTED THORACOSCOPY;  Surgeon: Loreli Slot, MD;  Location: [facility] OR;  Service: Thoracic;  Laterality: Left;  REDO LEFT VATS  . Thoracotomy Left 05/03/2013    Procedure: THORACOTOMY MAJOR;  Surgeon: Loreli Slot, MD;  Location: [facility] OR;  Service: Thoracic;  Laterality: Left;  . Lobectomy Left  05/03/2013    Procedure: LOBECTOMY;  Surgeon: Loreli Slot, MD;  Location: [facility] OR;  Service: Thoracic;  Laterality: Left;  . Wedge resection Left 05/03/2013    Procedure: WEDGE RESECTION;  Surgeon: Loreli Slot, MD;  Location: [facility] OR;  Service: Thoracic;  Laterality: Left;  . Open reduction internal fixation (orif) tibia/fibula fracture Right 07/01/2013    Procedure: OPEN REDUCTION INTERNAL FIXATION TIBIAL  FRACTURE;  Surgeon: Javier Docker, MD;  Location: [facility] ORS;  Service: Orthopedics;  Laterality: Right;    REVIEW OF SYSTEMS:  A comprehensive review of systems was negative except for: Constitutional: positive for fatigue Musculoskeletal: positive for Left chest wall pain   PHYSICAL EXAMINATION: General appearance: alert, cooperative, fatigued and no distress Head: Normocephalic, without obvious abnormality, atraumatic Neck: no adenopathy, no JVD, supple, symmetrical, trachea  midline and thyroid not enlarged, symmetric, no tenderness/mass/nodules Lymph nodes: Cervical, supraclavicular, and axillary nodes normal. Resp: clear to auscultation bilaterally Back: symmetric, no curvature. ROM normal. No CVA tenderness. Cardio: regular rate and rhythm, S1, S2 normal, no murmur, click, rub or gallop GI: soft, non-tender; bowel sounds normal; no masses,  no organomegaly Extremities: extremities normal, atraumatic, no cyanosis or edema  ECOG PERFORMANCE STATUS: 1 - Symptomatic but completely ambulatory  Blood pressure 125/76, pulse 80, temperature 97 F (36.1 C), temperature source Oral, resp. rate 20, height 6\' 1"  (1.854 m), weight 242 lb 6.4 oz (109.952 kg).  LABORATORY DATA: Lab Results  Component Value Date   WBC 7.9 09/06/2013   HGB 11.1* 09/06/2013   HCT 33.1* 09/06/2013   MCV 81.5 09/06/2013   PLT 452* 09/06/2013      Chemistry      Component Value Date/Time   NA 134* 09/06/2013 0903   NA 134* 07/04/2013 0455   NA 138 03/25/2012 1225   K 4.5 09/06/2013 0903   K 4.2  07/04/2013 0455   K 4.2 03/25/2012 1225   CL 96 07/04/2013 0455   CL 105 03/03/2013 1002   CL 98 03/25/2012 1225   CO2 18* 09/06/2013 0903   CO2 32 07/04/2013 0455   CO2 26 03/25/2012 1225   BUN 18.0 09/06/2013 0903   BUN 20 07/04/2013 0455   BUN 11 03/25/2012 1225   CREATININE 1.1 09/06/2013 0903   CREATININE 0.82 07/04/2013 0455   CREATININE 1.0 03/25/2012 1225      Component Value Date/Time   CALCIUM 9.9 09/06/2013 0903   CALCIUM 9.1 07/04/2013 0455   CALCIUM 8.6 03/25/2012 1225   ALKPHOS 98 09/06/2013 0903   ALKPHOS 51 07/02/2013 0455   ALKPHOS 84 03/25/2012 1225   AST 15 09/06/2013 0903   AST 23 07/02/2013 0455   AST 33 03/25/2012 1225   ALT 21 09/06/2013 0903   ALT 34 07/02/2013 0455   ALT 43 03/25/2012 1225   BILITOT 0.21 09/06/2013 0903   BILITOT 0.5 07/02/2013 0455   BILITOT 0.80 03/25/2012 1225       RADIOGRAPHIC STUDIES: Dg Chest 2 View  07/27/2013   CLINICAL DATA:  57 year old male with left lung cancer.  EXAM: CHEST  2 VIEW  COMPARISON:  07/01/2013 and earlier.  FINDINGS: Postoperative changes in the left lung with volume loss. Interval improved residual left lung ventilation. Mild nodular opacity along staple lines in the left lung not significantly changed. Stable cardiac size and mediastinal contours. Visualized tracheal air column is within normal limits. The right lung is clear. No pleural effusion. No acute osseous abnormality identified. Left lateral rib likely postoperative changes.  IMPRESSION: Postoperative changes to the left lung with interval improved ventilation. Continued attention directed on follow up exams to residual nodular opacity along the left lung staple lines.   Electronically Signed   By: Augusto Gamble M.D.   On: 07/27/2013 09:21    ASSESSMENT AND PLAN: This is a very pleasant 57 years old white male with recently diagnosed as stage IIB non-small cell lung cancer, adenocarcinoma status post resection and currently undergoing adjuvant chemotherapy with cisplatin  and Alimta status post 3 cycles. The difficulty with nausea and vague stomach pain after cycle #3 but did not take his antiemetics. Patient was advised to take his anti-emetics as prescribed. The patient was discussed with Tobi Bastos and also seen by Dr. Arbutus Ped. [facility] is still drawing his weekly labs and he will need orders to cover  the next 2 weeks of weekly labs as well. He is a 45 minute 1 way trip to the cancer center and requests a prescription for his pain medication on him in the interim as he will run out prior to his next followup appointment. Patient was given a prescription for Percocet 5/325 mg tablets, one tablet by mouth every 6 hours as needed for pain a total of 60 with no refill. He was also given a second prescription with identical instructions, a total of 60 with no refill not to be filled before 09/20/2013. A prescription for his Xanax 0.25 mg tablets to be taken twice daily as needed for anxiety, total of 60 with no refills called in to his pharmacy of record. He'll proceed with cycle #4 of his adjuvant chemotherapy with cisplatin and Alimta today as scheduled. He will continue with weekly labs also as scheduled to be obtained via [facility] home [facility] nurse with the results faxed to Dr. Arbutus Ped. He'll return in 3 weeks with restaging CT scan of the chest with contrast to reevaluate his disease.  Matthieu Loftus E, PA-C   He was advised to call immediately if he has any concerning symptoms in the interval. He was given a refill for Vicodin for pain control. The patient voices understanding of current disease status and treatment options and is in agreement with the current care plan.  All questions were answered. The patient knows to call the clinic with any problems, questions or concerns. We can certainly see the patient much sooner if necessary.  ADDENDUM: Hematology/Oncology Attending: I had the face to face encounter with the patient today. I recommended his care plan. He  is a very pleasant 57 years old white male who is currently undergoing adjuvant chemotherapy for stage IIB non-small cell lung cancer. The patient is status post 3 cycles of systemic chemotherapy with cisplatin and Alimta. He tolerated the last cycle of his treatment fairly well except for pedal episodes of nausea after his treatment. He is currently feeling better with no nausea or vomiting. He denied having any fever or chills. I recommended for the patient to proceed with cycle #4 today as scheduled. He would come back for followup visit in 3 weeks with repeat CT scan of the chest for restaging of his disease. He was given refills today of his pain medication as well as Xanax for anxiety. He was advised to call immediately if he has any concerning symptoms in the interval. Lajuana Matte., MD 09/06/2013

## 2013-09-06 NOTE — Progress Notes (Signed)
Pt voided 350cc urine output. Chemo ordered.

## 2013-09-06 NOTE — Telephone Encounter (Signed)
pt neede to r/s lab adn ct...done

## 2013-09-06 NOTE — Patient Instructions (Signed)
Continue with weekly labs as obtained by the advanced home care nurse Followup with Dr. Arbutus Ped in 3 weeks with restaging CT scan of your chest to reevaluate your disease

## 2013-09-06 NOTE — Telephone Encounter (Signed)
Gave pt appt for lab and MD for november 2014

## 2013-09-06 NOTE — Patient Instructions (Signed)
[facility] Discharge Instructions for Patients Receiving Chemotherapy  Today you received the following chemotherapy agents Alimta/Cisplatin  To help prevent nausea and vomiting after your treatment, we encourage you to take your nausea medication as directed If you develop nausea and vomiting that is not controlled by your nausea medication, call the clinic.   BELOW ARE SYMPTOMS THAT SHOULD BE REPORTED IMMEDIATELY:  *FEVER GREATER THAN 100.5 F  *CHILLS WITH OR WITHOUT FEVER  NAUSEA AND VOMITING THAT IS NOT CONTROLLED WITH YOUR NAUSEA MEDICATION  *UNUSUAL SHORTNESS OF BREATH  *UNUSUAL BRUISING OR BLEEDING  TENDERNESS IN MOUTH AND THROAT WITH OR WITHOUT PRESENCE OF ULCERS  *URINARY PROBLEMS  *BOWEL PROBLEMS  UNUSUAL RASH Items with * indicate a potential emergency and should be followed up as soon as possible.  Feel free to call the clinic you have any questions or concerns. The clinic phone number is [phone].

## 2013-09-13 ENCOUNTER — Telehealth: Payer: Self-pay | Admitting: *Deleted

## 2013-09-13 ENCOUNTER — Encounter: Payer: Self-pay | Admitting: Internal Medicine

## 2013-09-13 MED ORDER — ONDANSETRON HCL 8 MG PO TABS: 8.0000 mg | ORAL_TABLET | Freq: Three times a day (TID) | ORAL | Status: DC | PRN

## 2013-09-13 NOTE — Telephone Encounter (Signed)
In regards to my chart msg.  Called and spoke with pt about concerns.  Will call in additional nausea medication zofran, pt encouraged to eat small frequent meals while his appetite is decreased, and needs to keep watching places in his hand and show Adrena at his next f/u visit.  Pt verbalized understanding.  SLJ

## 2013-09-15 ENCOUNTER — Telehealth: Payer: Self-pay | Admitting: *Deleted

## 2013-09-15 NOTE — Telephone Encounter (Signed)
Weekly lab work CBC/ CMET dated 09/13/13 given to Dr Donnald Garre to review.  SLJ

## 2013-09-16 ENCOUNTER — Other Ambulatory Visit: Payer: Self-pay

## 2013-09-20 ENCOUNTER — Encounter: Payer: Self-pay | Admitting: Internal Medicine

## 2013-09-21 ENCOUNTER — Other Ambulatory Visit: Payer: Self-pay | Admitting: *Deleted

## 2013-09-21 ENCOUNTER — Ambulatory Visit (HOSPITAL_COMMUNITY)
Admission: RE | Admit: 2013-09-21 | Discharge: 2013-09-21 | Disposition: A | Discharge status: Home / Self Care | Payer: BC Managed Care – PPO | Source: Ambulatory Visit | Attending: Physician Assistant | Admitting: Physician Assistant

## 2013-09-21 ENCOUNTER — Encounter (HOSPITAL_COMMUNITY): Payer: Self-pay

## 2013-09-21 ENCOUNTER — Ambulatory Visit (HOSPITAL_BASED_OUTPATIENT_CLINIC_OR_DEPARTMENT_OTHER): Payer: BC Managed Care – PPO | Admitting: Lab

## 2013-09-21 DIAGNOSIS — C3490 Malignant neoplasm of unspecified part of unspecified bronchus or lung: Secondary | ICD-10-CM | POA: Insufficient documentation

## 2013-09-21 DIAGNOSIS — M47814 Spondylosis without myelopathy or radiculopathy, thoracic region: Secondary | ICD-10-CM | POA: Insufficient documentation

## 2013-09-21 DIAGNOSIS — C3492 Malignant neoplasm of unspecified part of left bronchus or lung: Secondary | ICD-10-CM

## 2013-09-21 DIAGNOSIS — R918 Other nonspecific abnormal finding of lung field: Secondary | ICD-10-CM | POA: Insufficient documentation

## 2013-09-21 DIAGNOSIS — I2510 Atherosclerotic heart disease of native coronary artery without angina pectoris: Secondary | ICD-10-CM | POA: Insufficient documentation

## 2013-09-21 DIAGNOSIS — C3430 Malignant neoplasm of lower lobe, unspecified bronchus or lung: Secondary | ICD-10-CM

## 2013-09-21 LAB — POCT I-STAT, CHEM 8
Chloride: 102 mEq/L (ref 96–112)
Creatinine, Ser: 1 mg/dL (ref 0.50–1.35)
Glucose, Bld: 94 mg/dL (ref 70–99)
HCT: 48 % (ref 39.0–52.0)
Hemoglobin: 16.3 g/dL (ref 13.0–17.0)
Potassium: 3.8 mEq/L (ref 3.5–5.1)
Sodium: 139 mEq/L (ref 135–145)

## 2013-09-21 LAB — CBC WITH DIFFERENTIAL/PLATELET
Eosinophils Absolute: 0.1 10*3/uL (ref 0.0–0.5)
HCT: 30.3 % — ABNORMAL LOW (ref 38.4–49.9)
MONO#: 0.7 10*3/uL (ref 0.1–0.9)
MONO%: 17.4 % — ABNORMAL HIGH (ref 0.0–14.0)
NEUT#: 1.2 10*3/uL — ABNORMAL LOW (ref 1.5–6.5)
RBC: 3.66 10*6/uL — ABNORMAL LOW (ref 4.20–5.82)
RDW: 15.4 % — ABNORMAL HIGH (ref 11.0–14.6)
WBC: 4.2 10*3/uL (ref 4.0–10.3)

## 2013-09-21 LAB — COMPREHENSIVE METABOLIC PANEL (CC13)
ALT: 20 U/L (ref 0–55)
Albumin: 3.6 g/dL (ref 3.5–5.0)
Alkaline Phosphatase: 105 U/L (ref 40–150)
BUN: 34.1 mg/dL — ABNORMAL HIGH (ref 7.0–26.0)
CO2: 23 mEq/L (ref 22–29)
Chloride: 103 mEq/L (ref 98–109)
Glucose: 94 mg/dl (ref 70–140)
Potassium: 4.7 mEq/L (ref 3.5–5.1)
Sodium: 138 mEq/L (ref 136–145)
Total Protein: 7.6 g/dL (ref 6.4–8.3)

## 2013-09-21 MED ORDER — BENZONATATE 100 MG PO CAPS: 100.0000 mg | ORAL_CAPSULE | Freq: Three times a day (TID) | ORAL | Status: DC | PRN

## 2013-09-21 MED ORDER — IOHEXOL 300 MG/ML  SOLN
80.0000 mL | Freq: Once | INTRAMUSCULAR | Status: AC | PRN
  Administered 2013-09-21: 80 mL via INTRAVENOUS

## 2013-09-21 NOTE — Telephone Encounter (Signed)
Received message from RN who drew pts lab work 09/20/13 that pts K is 5.5.  Per AJ, pt needs to come in 11/11 for repeat CMET at [facility].  Moved CT scan and lab work from 11/13 to 11/11 so pt can have lab work drawn on 11/11.  Pt is aware of appts.  Pt also requested a rx for something for cough.  Per Dr Donnald Garre okay to give tessalon perles.  Pt verbalized understanding.  SLJ

## 2013-09-23 ENCOUNTER — Ambulatory Visit (HOSPITAL_COMMUNITY): Admission: RE | Admit: 2013-09-23 | Payer: BC Managed Care – PPO | Source: Ambulatory Visit

## 2013-09-23 ENCOUNTER — Other Ambulatory Visit: Payer: BC Managed Care – PPO | Admitting: Lab

## 2013-09-24 ENCOUNTER — Other Ambulatory Visit (HOSPITAL_COMMUNITY): Payer: BC Managed Care – PPO

## 2013-09-24 ENCOUNTER — Other Ambulatory Visit: Payer: BC Managed Care – PPO | Admitting: Lab

## 2013-09-27 ENCOUNTER — Ambulatory Visit: Payer: BC Managed Care – PPO | Admitting: Internal Medicine

## 2013-09-27 ENCOUNTER — Telehealth: Payer: Self-pay | Admitting: Internal Medicine

## 2013-09-27 ENCOUNTER — Encounter: Payer: Self-pay | Admitting: Internal Medicine

## 2013-09-27 ENCOUNTER — Other Ambulatory Visit: Payer: BC Managed Care – PPO | Admitting: Lab

## 2013-09-27 ENCOUNTER — Ambulatory Visit (HOSPITAL_BASED_OUTPATIENT_CLINIC_OR_DEPARTMENT_OTHER): Payer: BC Managed Care – PPO | Admitting: Internal Medicine

## 2013-09-27 DIAGNOSIS — C771 Secondary and unspecified malignant neoplasm of intrathoracic lymph nodes: Secondary | ICD-10-CM

## 2013-09-27 DIAGNOSIS — C3430 Malignant neoplasm of lower lobe, unspecified bronchus or lung: Secondary | ICD-10-CM

## 2013-09-27 MED ORDER — OXYCODONE-ACETAMINOPHEN 5-325 MG PO TABS: 1.0000 | ORAL_TABLET | Freq: Four times a day (QID) | ORAL | Status: DC | PRN

## 2013-09-27 MED ORDER — ALPRAZOLAM 0.25 MG PO TABS: 0.2500 mg | ORAL_TABLET | Freq: Two times a day (BID) | ORAL | Status: DC

## 2013-09-27 NOTE — Patient Instructions (Signed)
CURRENT THERAPY: Observation.  CHEMOTHERAPY INTENT: Adjuvant/Curative.  CURRENT # OF CHEMOTHERAPY CYCLES: 4 CURRENT ANTIEMETICS: N/A  CURRENT SMOKING STATUS: Quit smoking 05/02/13  ORAL CHEMOTHERAPY AND CONSENT: None  CURRENT BISPHOSPHONATES USE: None  PAIN MANAGEMENT: 2/10  NARCOTICS INDUCED CONSTIPATION: None  LIVING WILL AND CODE STATUS: Full Code

## 2013-09-27 NOTE — Progress Notes (Signed)
[facility] [phone]   [phone]  OFFICE PROGRESS NOTE  Ailene Ravel, MD Dr. Sharman Crate [place] [address] 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  2) stage IIB (T3, N1, M0) non-small cell lung cancer, adenocarcinoma diagnosed in June of 2014.   PRIOR THERAPY:  1) Status post 3 cycles of systemic chemotherapy with carboplatin and etoposide. Last dose was given on 07/16/2011.  2) Left video-assisted thoracoscopy, mini thoracotomy; wedge, posterior segment of left upper lobe; wedge, anterior segment of left lower lobe with node dissection.  3) Redo left video-assisted thoracoscopy, lysis of adhesions, wedge resection of left lower lobe, thoracoscopic left lower lobectomy under the care of Dr. Dorris Fetch on 05/03/2013.  4) Adjuvant chemotherapy with cisplatin 75 mg/M2 and Alimta 500 mg/M2 every 3 weeks, first dose given 06/28/2013. Status post 4 cycles.  CURRENT THERAPY: Observation.  CHEMOTHERAPY INTENT: Adjuvant/Curative.  CURRENT # OF CHEMOTHERAPY CYCLES: 4 CURRENT ANTIEMETICS: N/A  CURRENT SMOKING STATUS: Quit smoking 05/02/13  ORAL CHEMOTHERAPY AND CONSENT: None  CURRENT BISPHOSPHONATES USE: None  PAIN MANAGEMENT: 2/10  NARCOTICS INDUCED CONSTIPATION: None  LIVING WILL AND CODE STATUS: Full Code  INTERVAL HISTORY: [name] [name] 57 y.o. male returns to the clinic today for followup visit accompanied by his wife. The patient is feeling fine today with no specific complaints. The patient denied having any fever or chills. He denied having any shortness of breath, cough or hemoptysis but continues to have persistent pain on the left side of his chest after the surgical resection. He continues on Vicodin for his pain management and requested a refill of his medication. He has no weight loss or night sweats. He  tolerated cycle #4 of his adjuvant chemotherapy fairly well. He has repeat CT scan of the chest performed recently and he is here for evaluation and discussion of his scan results.  MEDICAL HISTORY: Past Medical History  Diagnosis Date  . Hypercholesteremia     takes Pravastatin nightly  . Lung cancer   . HTN (hypertension)     takes Lisinopril and HCTZ daily  . Shortness of breath     with exertion;Albuterol inhaler prn  . Insomnia     takes Xanax prn    ALLERGIES:  has No Known Allergies.  MEDICATIONS:  Current Outpatient Prescriptions  Medication Sig Dispense Refill  . albuterol (PROVENTIL HFA;VENTOLIN HFA) 108 (90 BASE) MCG/ACT inhaler Inhale 1-2 puffs into the lungs every 4 (four) hours as needed for wheezing.      Marland Kitchen ALPRAZolam (XANAX) 0.25 MG tablet Take 1 tablet (0.25 mg total) by mouth at bedtime as needed for sleep.  30 tablet  0  . aspirin EC 81 MG tablet Take 81 mg by mouth every morning.      . benzonatate (TESSALON PERLES) 100 MG capsule Take 1 capsule (100 mg total) by mouth 3 (three) times daily as needed for cough.  30 capsule  0  . dexamethasone (DECADRON) 4 MG tablet 4 Milligram by mouth twice a day the day before, day of and day after the chemotherapy every three-week  30 tablet  0  . folic acid (FOLVITE) 1 MG tablet Take 1 mg by mouth every morning.      . gabapentin (NEURONTIN) 100 MG capsule Take 1 capsule (100 mg total) by mouth 3 (three) times daily.  90 capsule  1  .  lisinopril-hydrochlorothiazide (PRINZIDE,ZESTORETIC) 20-25 MG per tablet Take 1 tablet by mouth every morning.       . ondansetron (ZOFRAN) 8 MG tablet Take 1 tablet (8 mg total) by mouth every 8 (eight) hours as needed for nausea.  30 tablet  1  . oxyCODONE-acetaminophen (PERCOCET/ROXICET) 5-325 MG per tablet Take 1 tablet by mouth every 6 (six) hours as needed for severe pain.  60 tablet  0  . pravastatin (PRAVACHOL) 40 MG tablet Take 40 mg by mouth at bedtime.       . prochlorperazine  (COMPAZINE) 10 MG tablet       . zolpidem (AMBIEN) 5 MG tablet Take 1 tablet (5 mg total) by mouth at bedtime as needed and may repeat dose one time if needed for sleep.  30 tablet  1   No current facility-administered medications for this visit.    SURGICAL HISTORY:  Past Surgical History  Procedure Laterality Date  . Left video-assisted thoracoscopy, mini thoracotomy; wedge, posterior segment of left upper lobe; wedge, anterior segment of left lower lobe with node dissection.  09/02/11    [facility]  . Video assisted thoracoscopy Left 05/03/2013    Procedure: REDO VIDEO ASSISTED THORACOSCOPY;  Surgeon: Loreli Slot, MD;  Location: [facility] OR;  Service: Thoracic;  Laterality: Left;  REDO LEFT VATS  . Thoracotomy Left 05/03/2013    Procedure: THORACOTOMY MAJOR;  Surgeon: Loreli Slot, MD;  Location: [facility] OR;  Service: Thoracic;  Laterality: Left;  . Lobectomy Left 05/03/2013    Procedure: LOBECTOMY;  Surgeon: Loreli Slot, MD;  Location: [facility] OR;  Service: Thoracic;  Laterality: Left;  . Wedge resection Left 05/03/2013    Procedure: WEDGE RESECTION;  Surgeon: Loreli Slot, MD;  Location: [facility] OR;  Service: Thoracic;  Laterality: Left;  . Open reduction internal fixation (orif) tibia/fibula fracture Right 07/01/2013    Procedure: OPEN REDUCTION INTERNAL FIXATION TIBIAL  FRACTURE;  Surgeon: Javier Docker, MD;  Location: [facility] ORS;  Service: Orthopedics;  Laterality: Right;    REVIEW OF SYSTEMS:  Constitutional: negative Eyes: negative Ears, nose, mouth, throat, and face: negative Respiratory: positive for dyspnea on exertion Cardiovascular: negative Gastrointestinal: negative Genitourinary:negative Integument/breast: negative Hematologic/lymphatic: negative Musculoskeletal:negative Neurological: negative Behavioral/Psych: negative Endocrine: negative Allergic/Immunologic: negative   PHYSICAL EXAMINATION: General appearance: alert, cooperative, fatigued and no  distress Head: Normocephalic, without obvious abnormality, atraumatic Neck: no adenopathy, no JVD, supple, symmetrical, trachea midline and thyroid not enlarged, symmetric, no tenderness/mass/nodules Lymph nodes: Cervical, supraclavicular, and axillary nodes normal. Resp: clear to auscultation bilaterally Back: symmetric, no curvature. ROM normal. No CVA tenderness. Cardio: regular rate and rhythm, S1, S2 normal, no murmur, click, rub or gallop GI: soft, non-tender; bowel sounds normal; no masses,  no organomegaly Extremities: extremities normal, atraumatic, no cyanosis or edema Neurologic: Alert and oriented X 3, normal strength and tone. Normal symmetric reflexes. Normal coordination and gait  ECOG PERFORMANCE STATUS: 1 - Symptomatic but completely ambulatory  Blood pressure 126/71, pulse 79, temperature 97.2 F (36.2 C), temperature source Oral, resp. rate 18, height 6\' 1"  (1.854 m), weight 236 lb 3.2 oz (107.14 kg).  LABORATORY DATA: Lab Results  Component Value Date   WBC 4.2 09/21/2013   HGB 16.3 09/21/2013   HCT 48.0 09/21/2013   MCV 82.7 09/21/2013   PLT 266 09/21/2013      Chemistry      Component Value Date/Time   NA 139 09/21/2013 1438   NA 138 09/21/2013 1311   NA 138 03/25/2012 1225  K 3.8 09/21/2013 1438   K 4.7 09/21/2013 1311   K 4.2 03/25/2012 1225   CL 102 09/21/2013 1438   CL 105 03/03/2013 1002   CL 98 03/25/2012 1225   CO2 23 09/21/2013 1311   CO2 32 07/04/2013 0455   CO2 26 03/25/2012 1225   BUN 15 09/21/2013 1438   BUN 34.1* 09/21/2013 1311   BUN 11 03/25/2012 1225   CREATININE 1.00 09/21/2013 1438   CREATININE 2.4* 09/21/2013 1311   CREATININE 1.0 03/25/2012 1225      Component Value Date/Time   CALCIUM 9.9 09/21/2013 1311   CALCIUM 9.1 07/04/2013 0455   CALCIUM 8.6 03/25/2012 1225   ALKPHOS 105 09/21/2013 1311   ALKPHOS 51 07/02/2013 0455   ALKPHOS 84 03/25/2012 1225   AST 16 09/21/2013 1311   AST 23 07/02/2013 0455   AST 33 03/25/2012 1225   ALT  20 09/21/2013 1311   ALT 34 07/02/2013 0455   ALT 43 03/25/2012 1225   BILITOT <0.20 09/21/2013 1311   BILITOT 0.5 07/02/2013 0455   BILITOT 0.80 03/25/2012 1225       RADIOGRAPHIC STUDIES: Ct Chest W Contrast  09/21/2013   CLINICAL DATA:  Lung cancer followup  EXAM: CT CHEST WITH CONTRAST  TECHNIQUE: Multidetector CT imaging of the chest was performed during intravenous contrast administration.  CONTRAST:  80mL OMNIPAQUE IOHEXOL 300 MG/ML  SOLN  FINDINGS: 4 mm right lower lobe nodule is identified, image 47/ series 5. This is unchanged from previous exam. Sub solid/ground-glass attenuating nodule within the superior segment of right lower lobe is unchanged measuring 8 mm, image 31/series 5. Postoperative changes within the left lung from wedge resection surgeries identified. Several small nonspecific nodular densities are identified within the posterior left base (images 47 and 48). These measure up to 4 mm. There is a wedge-shaped area of peripheral loculated pleural fluid within the left midlung measuring 2.9 x 1.8 cm. This is new from previous exam and is likely postoperative.  The heart size is normal. No pericardial effusion identified. Calcifications involving the LAD coronary artery noted. No enlarged mediastinal or hilar lymph nodes.  No axillary or supraclavicular adenopathy identified.  Incidental imaging through the upper abdomen shows no acute findings. There is no mass or adenopathy identified.  Review of the visualized osseous structures is significant for mild thoracic spondylosis. No aggressive lytic or sclerotic bone lesions.  IMPRESSION: *Status post wedge resection of left lower lobe pulmonary non *Status post rule wedge resection of left lower lobe lung nodule. No complicating features identified. *Several small nonspecific nodules are noted in the left base measuring up to 4 mm. Attention on followup imaging is advised. *Stable 4 mm right lower lobe nodule and 8 mm right lower lobe  ground-glass nodule   Electronically Signed   By: Signa Kell M.D.   On: 09/21/2013 16:22    ASSESSMENT AND PLAN: This is a very pleasant 57 years old white male with recently diagnosed as stage IIB non-small cell lung cancer, adenocarcinoma status post resection and completed 4 cycles of adjuvant chemotherapy with cisplatin and Alimta.  He tolerated the last cycle of his treatment fairly well.  His recent CT scan of the chest showed no significant evidence for disease progression. I discussed the scan results with the patient and his wife.  I recommended for the patient to continue on observation for now with repeat CT scan of the chest in 3 months. He would come back for followup visit at that time.  He was advised to call immediately if he has any concerning symptoms in the interval. He was given a refill for Vicodin for pain control. The patient voices understanding of current disease status and treatment options and is in agreement with the current care plan.  All questions were answered. The patient knows to call the clinic with any problems, questions or concerns. We can certainly see the patient much sooner if necessary. I spent 15 minutes counseling the patient face to face. The total time spent in the appointment was 25 minutes.

## 2013-09-27 NOTE — Telephone Encounter (Signed)
appts made per 11/17 POF Pt aware CT will sch and call AVS and cal given shh

## 2013-09-29 ENCOUNTER — Ambulatory Visit: Payer: BC Managed Care – PPO | Admitting: Internal Medicine

## 2013-10-26 ENCOUNTER — Ambulatory Visit (INDEPENDENT_AMBULATORY_CARE_PROVIDER_SITE_OTHER): Payer: BC Managed Care – PPO | Admitting: Thoracic Surgery (Cardiothoracic Vascular Surgery)

## 2013-10-26 ENCOUNTER — Encounter: Payer: Self-pay | Admitting: Thoracic Surgery (Cardiothoracic Vascular Surgery)

## 2013-10-26 DIAGNOSIS — Z9889 Other specified postprocedural states: Secondary | ICD-10-CM

## 2013-10-26 DIAGNOSIS — R918 Other nonspecific abnormal finding of lung field: Secondary | ICD-10-CM

## 2013-10-26 DIAGNOSIS — C3490 Malignant neoplasm of unspecified part of unspecified bronchus or lung: Secondary | ICD-10-CM

## 2013-10-26 DIAGNOSIS — Z902 Acquired absence of lung [part of]: Secondary | ICD-10-CM

## 2013-10-26 NOTE — Progress Notes (Signed)
HPI:  Mr. Feeny returns today for a scheduled 6 month postoperative followup visit.  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 Edwyna Shell in July 2012. He developed a new nodule in the left lower lobe. I did a thoracoscopic left lower lobectomy and node dissection in June of this year for what turned out to be a stage IIB (T3, N1, M0) non-small cell lung cancer, adenocarcinoma. He was T3 because of 2 separate adenocarcinomas within the lower lobe.  He has been treated with adjuvant chemotherapy with carboplatin and etoposide. He recently completed his fourth cycle of chemotherapy. He tolerated the first recycles well, but the fourth cycle was rough on him. He lost his appetite. He has lost about 20 pounds.  He had an episode at risk for cycle of chemotherapy were get lightheaded and fell. He suffered a right tib-fib fracture. He had ORIF of that by Dr. Shelle Iron. He still has problems with the leg.  He has not smoked since his surgery in June.  Past Medical History  Diagnosis Date  . Hypercholesteremia     takes Pravastatin nightly  . Lung cancer   . HTN (hypertension)     takes Lisinopril and HCTZ daily  . Shortness of breath     with exertion;Albuterol inhaler prn  . Insomnia     takes Xanax prn       Current Outpatient Prescriptions  Medication Sig Dispense Refill  . albuterol (PROVENTIL HFA;VENTOLIN HFA) 108 (90 BASE) MCG/ACT inhaler Inhale 1-2 puffs into the lungs every 4 (four) hours as needed for wheezing.      Marland Kitchen ALPRAZolam (XANAX) 0.25 MG tablet Take 1 tablet (0.25 mg total) by mouth 2 (two) times daily.  60 tablet  0  . aspirin EC 81 MG tablet Take 81 mg by mouth every morning.      . benzonatate (TESSALON PERLES) 100 MG capsule Take 1 capsule (100 mg total) by mouth 3 (three) times daily as needed for cough.  30 capsule  0  . folic acid (FOLVITE) 1 MG tablet Take  1 mg by mouth every morning.      Marland Kitchen lisinopril-hydrochlorothiazide (PRINZIDE,ZESTORETIC) 20-25 MG per tablet Take 1 tablet by mouth every morning.       Marland Kitchen oxyCODONE-acetaminophen (PERCOCET/ROXICET) 5-325 MG per tablet Take 1 tablet by mouth every 6 (six) hours as needed for severe pain.  60 tablet  0  . pravastatin (PRAVACHOL) 40 MG tablet Take 40 mg by mouth at bedtime.       Marland Kitchen zolpidem (AMBIEN) 5 MG tablet Take 1 tablet (5 mg total) by mouth at bedtime as needed and may repeat dose one time if needed for sleep.  30 tablet  1   No current facility-administered medications for this visit.    Physical Exam BP 147/88  Pulse 81  Resp 20  Ht 6\' 1"  (1.854 m)  Wt 240 lb (108.863 kg)  BMI 31.67 kg/m2  SpO36 66% 57 year old male in no acute distress General well-developed and well-nourished Neurologic alert and oriented x3 Cardiac regular rate and rhythm normal S1-S2 Lungs slightly diminished at left base, otherwise clear No cervical or subclavicular adenopathy  Diagnostic Tests: CT of chest 09/21/2013 FINDINGS:  4 mm right lower lobe nodule is identified, image 47/ series 5. This  is unchanged from previous exam. Sub solid/ground-glass attenuating  nodule within the superior segment of right lower  lobe is unchanged  measuring 8 mm, image 31/series 5. Postoperative changes within the  left lung from wedge resection surgeries identified. Several small  nonspecific nodular densities are identified within the posterior  left base (images 47 and 48). These measure up to 4 mm. There is a  wedge-shaped area of peripheral loculated pleural fluid within the  left midlung measuring 2.9 x 1.8 cm. This is new from previous exam  and is likely postoperative.  The heart size is normal. No pericardial effusion identified.  Calcifications involving the LAD coronary artery noted. No enlarged  mediastinal or hilar lymph nodes.  No axillary or supraclavicular adenopathy identified.  Incidental imaging  through the upper abdomen shows no acute  findings. There is no mass or adenopathy identified.  Review of the visualized osseous structures is significant for mild  thoracic spondylosis. No aggressive lytic or sclerotic bone lesions.  IMPRESSION:  *Status post wedge resection of left lower lobe pulmonary non  *Status post rule wedge resection of left lower lobe lung nodule. No  complicating features identified.  *Several small nonspecific nodules are noted in the left base  measuring up to 4 mm. Attention on followup imaging is advised.  *Stable 4 mm right lower lobe nodule and 8 mm right lower lobe  ground-glass nodule   Impression: 57 year old gentleman who now 6 months post thoracoscopic left lower lobectomy for a stage IIb non-small cell carcinoma. There actually were 2 separate lesions as well as a positive peribronchial node. He has undergone adjuvant chemotherapy for that with carboplatin and etoposide.  He has no evidence recurrent disease the present time.  Dr. Arbutus Ped is planning to repeat a CT scan in 3 months to followup the nodules on the recent CT.  Plan: I will plan to see him back in 6 months for one year followup. We will do a CT of the chest at that time.  Of course if there any issues with a CT in February that I need to address, I would be happy to do so in the interim.

## 2013-11-08 ENCOUNTER — Other Ambulatory Visit: Payer: Self-pay | Admitting: Internal Medicine

## 2013-11-08 DIAGNOSIS — C3490 Malignant neoplasm of unspecified part of unspecified bronchus or lung: Secondary | ICD-10-CM

## 2013-12-24 ENCOUNTER — Ambulatory Visit (HOSPITAL_COMMUNITY)
Admission: RE | Admit: 2013-12-24 | Discharge: 2013-12-24 | Disposition: A | Discharge status: Home / Self Care | Payer: BC Managed Care – PPO | Source: Ambulatory Visit | Attending: Internal Medicine | Admitting: Internal Medicine

## 2013-12-24 ENCOUNTER — Encounter (HOSPITAL_COMMUNITY): Payer: Self-pay

## 2013-12-24 ENCOUNTER — Other Ambulatory Visit (HOSPITAL_BASED_OUTPATIENT_CLINIC_OR_DEPARTMENT_OTHER): Payer: BC Managed Care – PPO

## 2013-12-24 DIAGNOSIS — C3490 Malignant neoplasm of unspecified part of unspecified bronchus or lung: Secondary | ICD-10-CM | POA: Insufficient documentation

## 2013-12-24 DIAGNOSIS — Z9221 Personal history of antineoplastic chemotherapy: Secondary | ICD-10-CM | POA: Insufficient documentation

## 2013-12-24 DIAGNOSIS — C3430 Malignant neoplasm of lower lobe, unspecified bronchus or lung: Secondary | ICD-10-CM

## 2013-12-24 DIAGNOSIS — R918 Other nonspecific abnormal finding of lung field: Secondary | ICD-10-CM | POA: Insufficient documentation

## 2013-12-24 LAB — COMPREHENSIVE METABOLIC PANEL (CC13)
ALT: 23 U/L (ref 0–55)
AST: 19 U/L (ref 5–34)
Albumin: 4.2 g/dL (ref 3.5–5.0)
Alkaline Phosphatase: 88 U/L (ref 40–150)
Anion Gap: 9 mEq/L (ref 3–11)
BILIRUBIN TOTAL: 0.33 mg/dL (ref 0.20–1.20)
BUN: 24.9 mg/dL (ref 7.0–26.0)
CO2: 24 mEq/L (ref 22–29)
Calcium: 9.9 mg/dL (ref 8.4–10.4)
Chloride: 106 mEq/L (ref 98–109)
Creatinine: 1.7 mg/dL — ABNORMAL HIGH (ref 0.7–1.3)
Glucose: 90 mg/dl (ref 70–140)
Potassium: 4.7 mEq/L (ref 3.5–5.1)
Sodium: 138 mEq/L (ref 136–145)
TOTAL PROTEIN: 7.7 g/dL (ref 6.4–8.3)

## 2013-12-24 LAB — CBC WITH DIFFERENTIAL/PLATELET
BASO%: 0.4 % (ref 0.0–2.0)
Basophils Absolute: 0 10*3/uL (ref 0.0–0.1)
EOS%: 4.9 % (ref 0.0–7.0)
Eosinophils Absolute: 0.4 10*3/uL (ref 0.0–0.5)
HCT: 34.8 % — ABNORMAL LOW (ref 38.4–49.9)
HGB: 11.3 g/dL — ABNORMAL LOW (ref 13.0–17.1)
LYMPH%: 32.7 % (ref 14.0–49.0)
MCH: 28.5 pg (ref 27.2–33.4)
MCHC: 32.6 g/dL (ref 32.0–36.0)
MCV: 87.5 fL (ref 79.3–98.0)
MONO#: 1 10*3/uL — ABNORMAL HIGH (ref 0.1–0.9)
MONO%: 12.1 % (ref 0.0–14.0)
NEUT#: 4.2 10*3/uL (ref 1.5–6.5)
NEUT%: 49.9 % (ref 39.0–75.0)
PLATELETS: 274 10*3/uL (ref 140–400)
RBC: 3.98 10*6/uL — ABNORMAL LOW (ref 4.20–5.82)
RDW: 13.6 % (ref 11.0–14.6)
WBC: 8.5 10*3/uL (ref 4.0–10.3)
lymph#: 2.8 10*3/uL (ref 0.9–3.3)

## 2013-12-24 MED ORDER — IOHEXOL 300 MG/ML  SOLN
80.0000 mL | Freq: Once | INTRAMUSCULAR | Status: AC | PRN
  Administered 2013-12-24: 80 mL via INTRAVENOUS

## 2013-12-28 ENCOUNTER — Ambulatory Visit (HOSPITAL_BASED_OUTPATIENT_CLINIC_OR_DEPARTMENT_OTHER): Payer: BC Managed Care – PPO | Admitting: Internal Medicine

## 2013-12-28 ENCOUNTER — Telehealth: Payer: Self-pay | Admitting: Internal Medicine

## 2013-12-28 ENCOUNTER — Encounter: Payer: Self-pay | Admitting: Internal Medicine

## 2013-12-28 VITALS — BP 121/63 | HR 82 | Temp 98.3°F | Resp 18 | Ht 73.0 in | Wt 246.2 lb

## 2013-12-28 DIAGNOSIS — R079 Chest pain, unspecified: Secondary | ICD-10-CM

## 2013-12-28 DIAGNOSIS — G609 Hereditary and idiopathic neuropathy, unspecified: Secondary | ICD-10-CM

## 2013-12-28 DIAGNOSIS — C3430 Malignant neoplasm of lower lobe, unspecified bronchus or lung: Secondary | ICD-10-CM

## 2013-12-28 DIAGNOSIS — C3490 Malignant neoplasm of unspecified part of unspecified bronchus or lung: Secondary | ICD-10-CM

## 2013-12-28 MED ORDER — BENZONATATE 100 MG PO CAPS: 100.0000 mg | ORAL_CAPSULE | Freq: Three times a day (TID) | ORAL | Status: DC | PRN

## 2013-12-28 MED ORDER — OXYCODONE-ACETAMINOPHEN 5-325 MG PO TABS: 1.0000 | ORAL_TABLET | Freq: Four times a day (QID) | ORAL | Status: DC | PRN

## 2013-12-28 NOTE — Patient Instructions (Signed)
Followup visit in 3 months with repeat CT scan of the chest.

## 2013-12-28 NOTE — Progress Notes (Signed)
[facility] [phone]   [phone]  OFFICE PROGRESS NOTE  [name], MD Dr. [name] [address] 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  2) stage IIB (T3, N1, M0) non-small cell lung cancer, adenocarcinoma diagnosed in June of 2014.   PRIOR THERAPY:  1) Status post 3 cycles of systemic chemotherapy with carboplatin and etoposide. Last dose was given on 07/16/2011.  2) Left video-assisted thoracoscopy, mini thoracotomy; wedge, posterior segment of left upper lobe; wedge, anterior segment of left lower lobe with node dissection.  3) Redo left video-assisted thoracoscopy, lysis of adhesions, wedge resection of left lower lobe, thoracoscopic left lower lobectomy under the care of Dr. Roxan Hockey on 05/03/2013.  4) Adjuvant chemotherapy with cisplatin 75 mg/M2 and Alimta 500 mg/M2 every 3 weeks, first dose given 06/28/2013. Status post 4 cycles.  CURRENT THERAPY: Observation.  CHEMOTHERAPY INTENT: Adjuvant/Curative.  CURRENT # OF CHEMOTHERAPY CYCLES: 0 CURRENT ANTIEMETICS: N/A  CURRENT SMOKING STATUS: Quit smoking 05/02/13  ORAL CHEMOTHERAPY AND CONSENT: None  CURRENT BISPHOSPHONATES USE: None  PAIN MANAGEMENT: 2/10  NARCOTICS INDUCED CONSTIPATION: None  LIVING WILL AND CODE STATUS: Full Code  INTERVAL HISTORY: [name] [name] 58 y.o. male returns to the clinic today for followup visit accompanied by his wife. The patient is feeling fine today with no specific complaints except for mild peripheral neuropathy mainly in the toes. The patient denied having any fever or chills. He denied having any shortness of breath, cough or hemoptysis but continues to have persistent pain on the left side of his chest after the surgical resection. He requested a refill of Percocet as well as the cough medicine. He has no weight  loss or night sweats. He has repeat CT scan of the chest performed recently and he is here for evaluation and discussion of his scan results.  MEDICAL HISTORY: Past Medical History  Diagnosis Date  . Hypercholesteremia     takes Pravastatin nightly  . Lung cancer   . HTN (hypertension)     takes Lisinopril and HCTZ daily  . Shortness of breath     with exertion;Albuterol inhaler prn  . Insomnia     takes Xanax prn    ALLERGIES:  has No Known Allergies.  MEDICATIONS:  Current Outpatient Prescriptions  Medication Sig Dispense Refill  . albuterol (PROVENTIL HFA;VENTOLIN HFA) 108 (90 BASE) MCG/ACT inhaler Inhale 1-2 puffs into the lungs every 4 (four) hours as needed for wheezing.      Marland Kitchen aspirin EC 81 MG tablet Take 81 mg by mouth every morning.      Marland Kitchen lisinopril-hydrochlorothiazide (PRINZIDE,ZESTORETIC) 20-25 MG per tablet Take 1 tablet by mouth every morning.       Marland Kitchen oxyCODONE-acetaminophen (PERCOCET/ROXICET) 5-325 MG per tablet Take 1 tablet by mouth every 6 (six) hours as needed for severe pain.  60 tablet  0  . pravastatin (PRAVACHOL) 40 MG tablet Take 40 mg by mouth at bedtime.       . ALPRAZolam (XANAX) 0.25 MG tablet TAKE ONE TABLET BY MOUTH TWICE DAILY  60 tablet  0  . benzonatate (TESSALON PERLES) 100 MG capsule Take 1 capsule (100 mg total) by mouth 3 (three) times daily as needed for cough.  30 capsule  0   No current facility-administered medications for this visit.    SURGICAL HISTORY:  Past Surgical History  Procedure Laterality Date  . Left video-assisted thoracoscopy, mini thoracotomy; wedge, posterior segment of left upper lobe; wedge, anterior segment of left lower lobe with node dissection.  09/02/11    [facility]  . Video assisted thoracoscopy Left 05/03/2013    Procedure: REDO VIDEO ASSISTED THORACOSCOPY;  Surgeon: Melrose Nakayama, MD;  Location: [facility];  Service: Thoracic;  Laterality: Left;  REDO LEFT VATS  . Thoracotomy Left 05/03/2013    Procedure:  THORACOTOMY MAJOR;  Surgeon: Melrose Nakayama, MD;  Location: [facility];  Service: Thoracic;  Laterality: Left;  . Lobectomy Left 05/03/2013    Procedure: LOBECTOMY;  Surgeon: Melrose Nakayama, MD;  Location: [facility];  Service: Thoracic;  Laterality: Left;  . Wedge resection Left 05/03/2013    Procedure: WEDGE RESECTION;  Surgeon: Melrose Nakayama, MD;  Location: [facility];  Service: Thoracic;  Laterality: Left;  . Open reduction internal fixation (orif) tibia/fibula fracture Right 07/01/2013    Procedure: OPEN REDUCTION INTERNAL FIXATION TIBIAL  FRACTURE;  Surgeon: Johnn Hai, MD;  Location: [facility];  Service: Orthopedics;  Laterality: Right;    REVIEW OF SYSTEMS:  Constitutional: negative Eyes: negative Ears, nose, mouth, throat, and face: negative Respiratory: positive for dyspnea on exertion Cardiovascular: negative Gastrointestinal: negative Genitourinary:negative Integument/breast: negative Hematologic/lymphatic: negative Musculoskeletal:negative Neurological: negative Behavioral/Psych: negative Endocrine: negative Allergic/Immunologic: negative   PHYSICAL EXAMINATION: General appearance: alert, cooperative, fatigued and no distress Head: Normocephalic, without obvious abnormality, atraumatic Neck: no adenopathy, no JVD, supple, symmetrical, trachea midline and thyroid not enlarged, symmetric, no tenderness/mass/nodules Lymph nodes: Cervical, supraclavicular, and axillary nodes normal. Resp: clear to auscultation bilaterally Back: symmetric, no curvature. ROM normal. No CVA tenderness. Cardio: regular rate and rhythm, S1, S2 normal, no murmur, click, rub or gallop GI: soft, non-tender; bowel sounds normal; no masses,  no organomegaly Extremities: extremities normal, atraumatic, no cyanosis or edema Neurologic: Alert and oriented X 3, normal strength and tone. Normal symmetric reflexes. Normal coordination and gait  ECOG PERFORMANCE STATUS: 1 - Symptomatic but completely  ambulatory  Blood pressure 121/63, pulse 82, temperature 98.3 F (36.8 C), temperature source Oral, resp. rate 18, height 6\' 1"  (1.854 m), weight 246 lb 3.2 oz (111.676 kg).  LABORATORY DATA: Lab Results  Component Value Date   WBC 8.5 12/24/2013   HGB 11.3* 12/24/2013   HCT 34.8* 12/24/2013   MCV 87.5 12/24/2013   PLT 274 12/24/2013      Chemistry      Component Value Date/Time   NA 138 12/24/2013 0839   NA 139 09/21/2013 1438   NA 138 03/25/2012 1225   K 4.7 12/24/2013 0839   K 3.8 09/21/2013 1438   K 4.2 03/25/2012 1225   CL 102 09/21/2013 1438   CL 105 03/03/2013 1002   CL 98 03/25/2012 1225   CO2 24 12/24/2013 0839   CO2 32 07/04/2013 0455   CO2 26 03/25/2012 1225   BUN 24.9 12/24/2013 0839   BUN 15 09/21/2013 1438   BUN 11 03/25/2012 1225   CREATININE 1.7* 12/24/2013 0839   CREATININE 1.00 09/21/2013 1438   CREATININE 1.0 03/25/2012 1225      Component Value Date/Time   CALCIUM 9.9 12/24/2013 0839   CALCIUM 9.1 07/04/2013 0455   CALCIUM 8.6 03/25/2012 1225   ALKPHOS 88 12/24/2013 0839   ALKPHOS 51 07/02/2013 0455   ALKPHOS 84 03/25/2012 1225   AST 19 12/24/2013 0839   AST 23 07/02/2013 0455   AST 33 03/25/2012 1225   ALT 23 12/24/2013 0839  ALT 34 07/02/2013 0455   ALT 43 03/25/2012 1225   BILITOT 0.33 12/24/2013 0839   BILITOT 0.5 07/02/2013 0455   BILITOT 0.80 03/25/2012 1225       RADIOGRAPHIC STUDIES: Ct Chest W Contrast  12/24/2013   CLINICAL DATA:  Lung cancer restaging.  Chemotherapy completed.  EXAM: CT CHEST WITH CONTRAST  TECHNIQUE: Multidetector CT imaging of the chest was performed during intravenous contrast administration.  CONTRAST:  76mL OMNIPAQUE IOHEXOL 300 MG/ML  SOLN  COMPARISON:  CT CHEST W/CM dated 09/21/2013  FINDINGS: Postsurgical change in the left upper lobe with volume loss and thickening along the surgical line. No evidence of nodularity.  In the right lung there is hyperexpansion. There is a ground-glass nodule in the right lower lobe measuring 8 mm  unchanged from prior (image 27, series 5). Smudgy nodule in the right middle lobe measures 4 mm also unchanged. Two ill-defined ground-glass nodules in the right upper lobe also unchanged.  No axillary or supraclavicular lymphadenopathy. No mediastinal or hilar lymphadenopathy. No pericardial fluid.  Limited view of the upper abdomen demonstrates normal adrenal glands. Limited view of the skeleton is unremarkable.  IMPRESSION: 1. Post  surgical and therapy change in the left upper lobe. 2. No evidence of local lung cancer recurrence or metastasis in the thorax. 3. Stable ground-glass nodules in the right lung.   Electronically Signed   By: Suzy Bouchard M.D.   On: 12/24/2013 11:01    ASSESSMENT AND PLAN: This is a very pleasant 58 years old white male with recently diagnosed as stage IIB non-small cell lung cancer, adenocarcinoma status post resection and completed 4 cycles of adjuvant chemotherapy with cisplatin and Alimta.  He tolerated the last cycle of his treatment fairly well.  His recent CT scan of the chest showed no significant evidence for disease progression. I discussed the scan results with the patient and his wife.  I recommended for the patient to continue on observation for now with repeat CT scan of the chest in 3 months. He would come back for followup visit at that time. He was advised to call immediately if he has any concerning symptoms in the interval. He was given a refill for percocet for pain control. The patient voices understanding of current disease status and treatment options and is in agreement with the current care plan.  All questions were answered. The patient knows to call the clinic with any problems, questions or concerns. We can certainly see the patient much sooner if necessary.  Disclaimer: This note was dictated with voice recognition software. Similar sounding words can inadvertently be transcribed and may not be corrected upon review.

## 2013-12-28 NOTE — Telephone Encounter (Signed)
gave pt appt for lab and MD for May 2015

## 2014-01-16 IMAGING — CR DG CHEST 2V
2 series · 2 of 2 positions shown · non-contrast
Comparison: Chest x-ray 05/07/2013.

CLINICAL DATA: Left lower lung mass.

CHEST - 2 VIEW

[w chest pa]
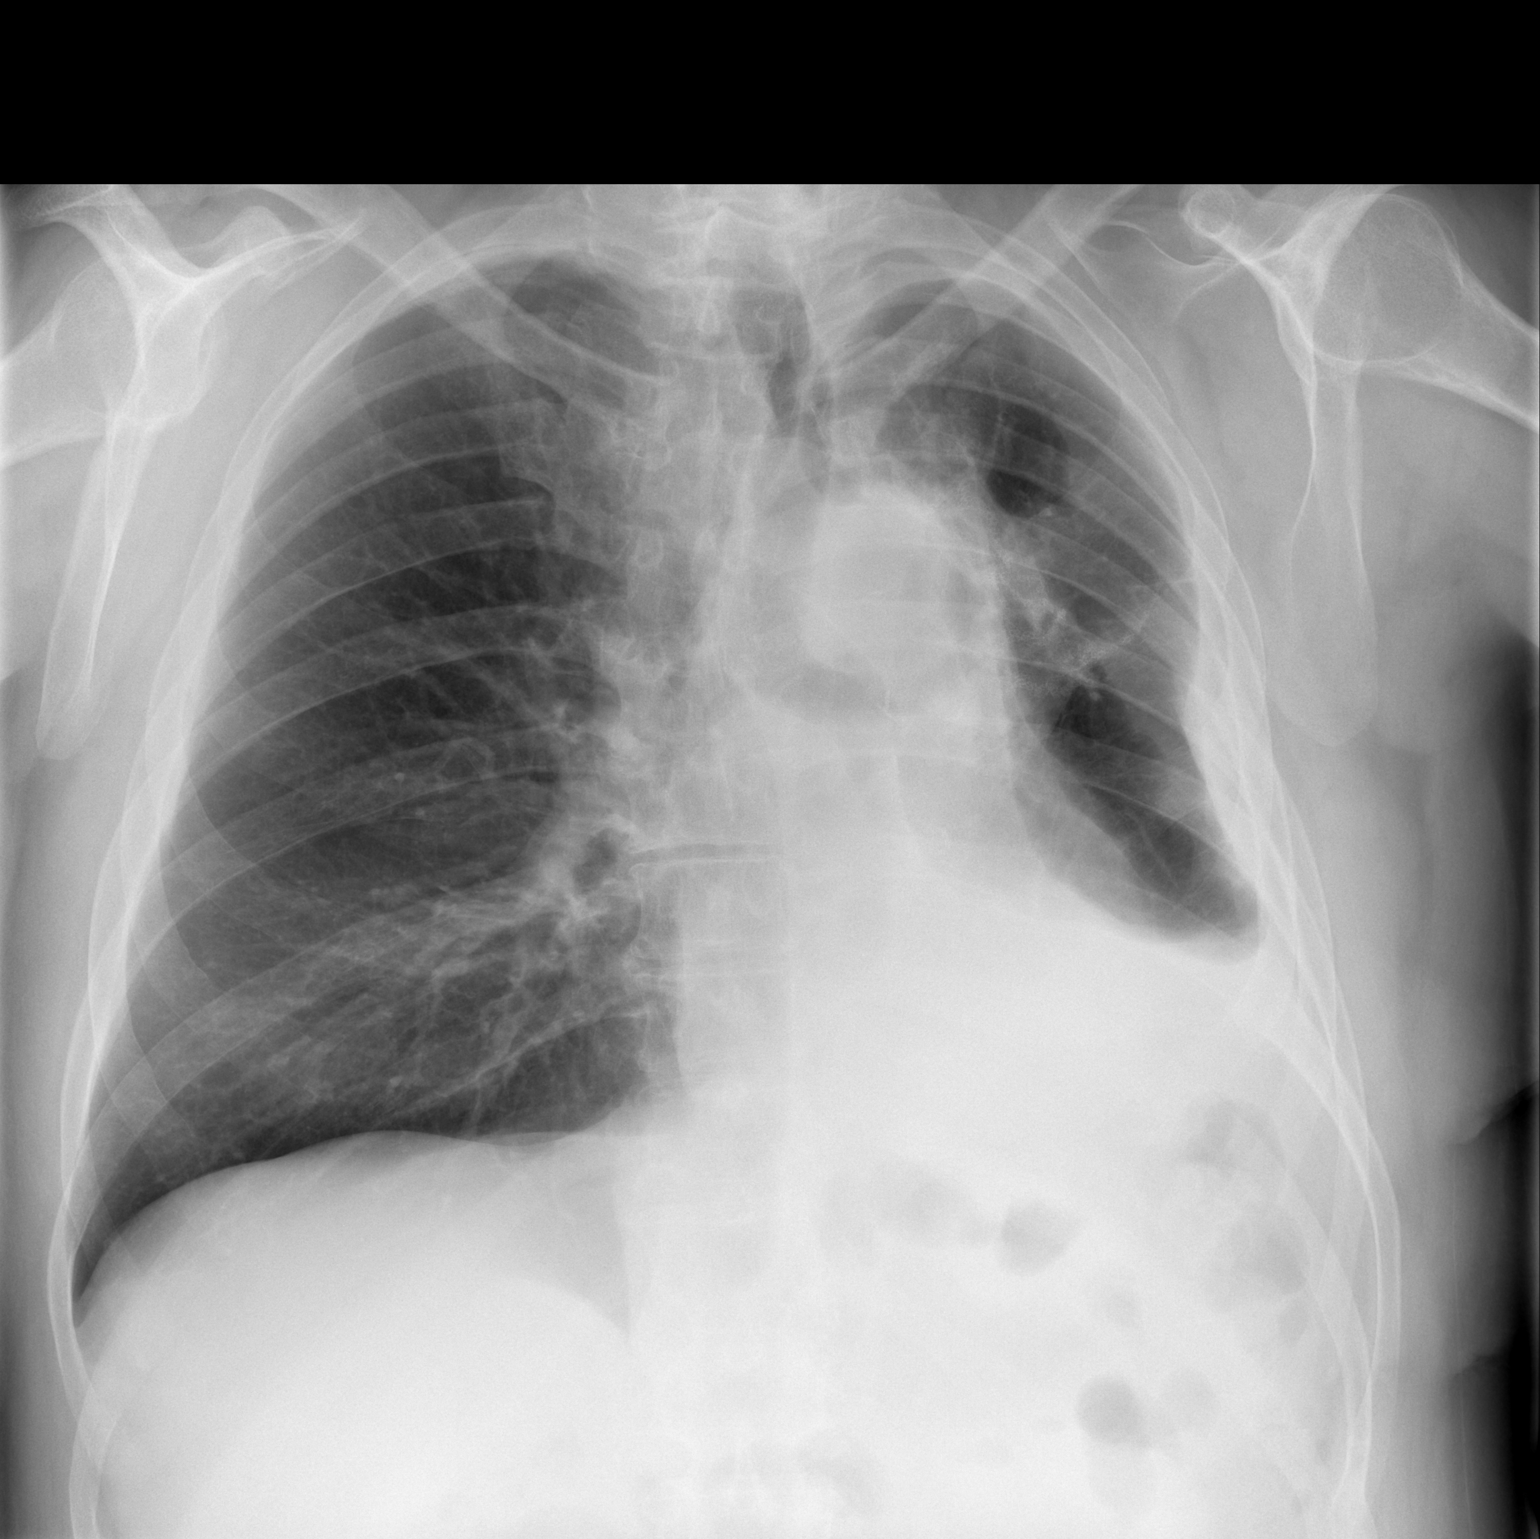

[w chest lat]
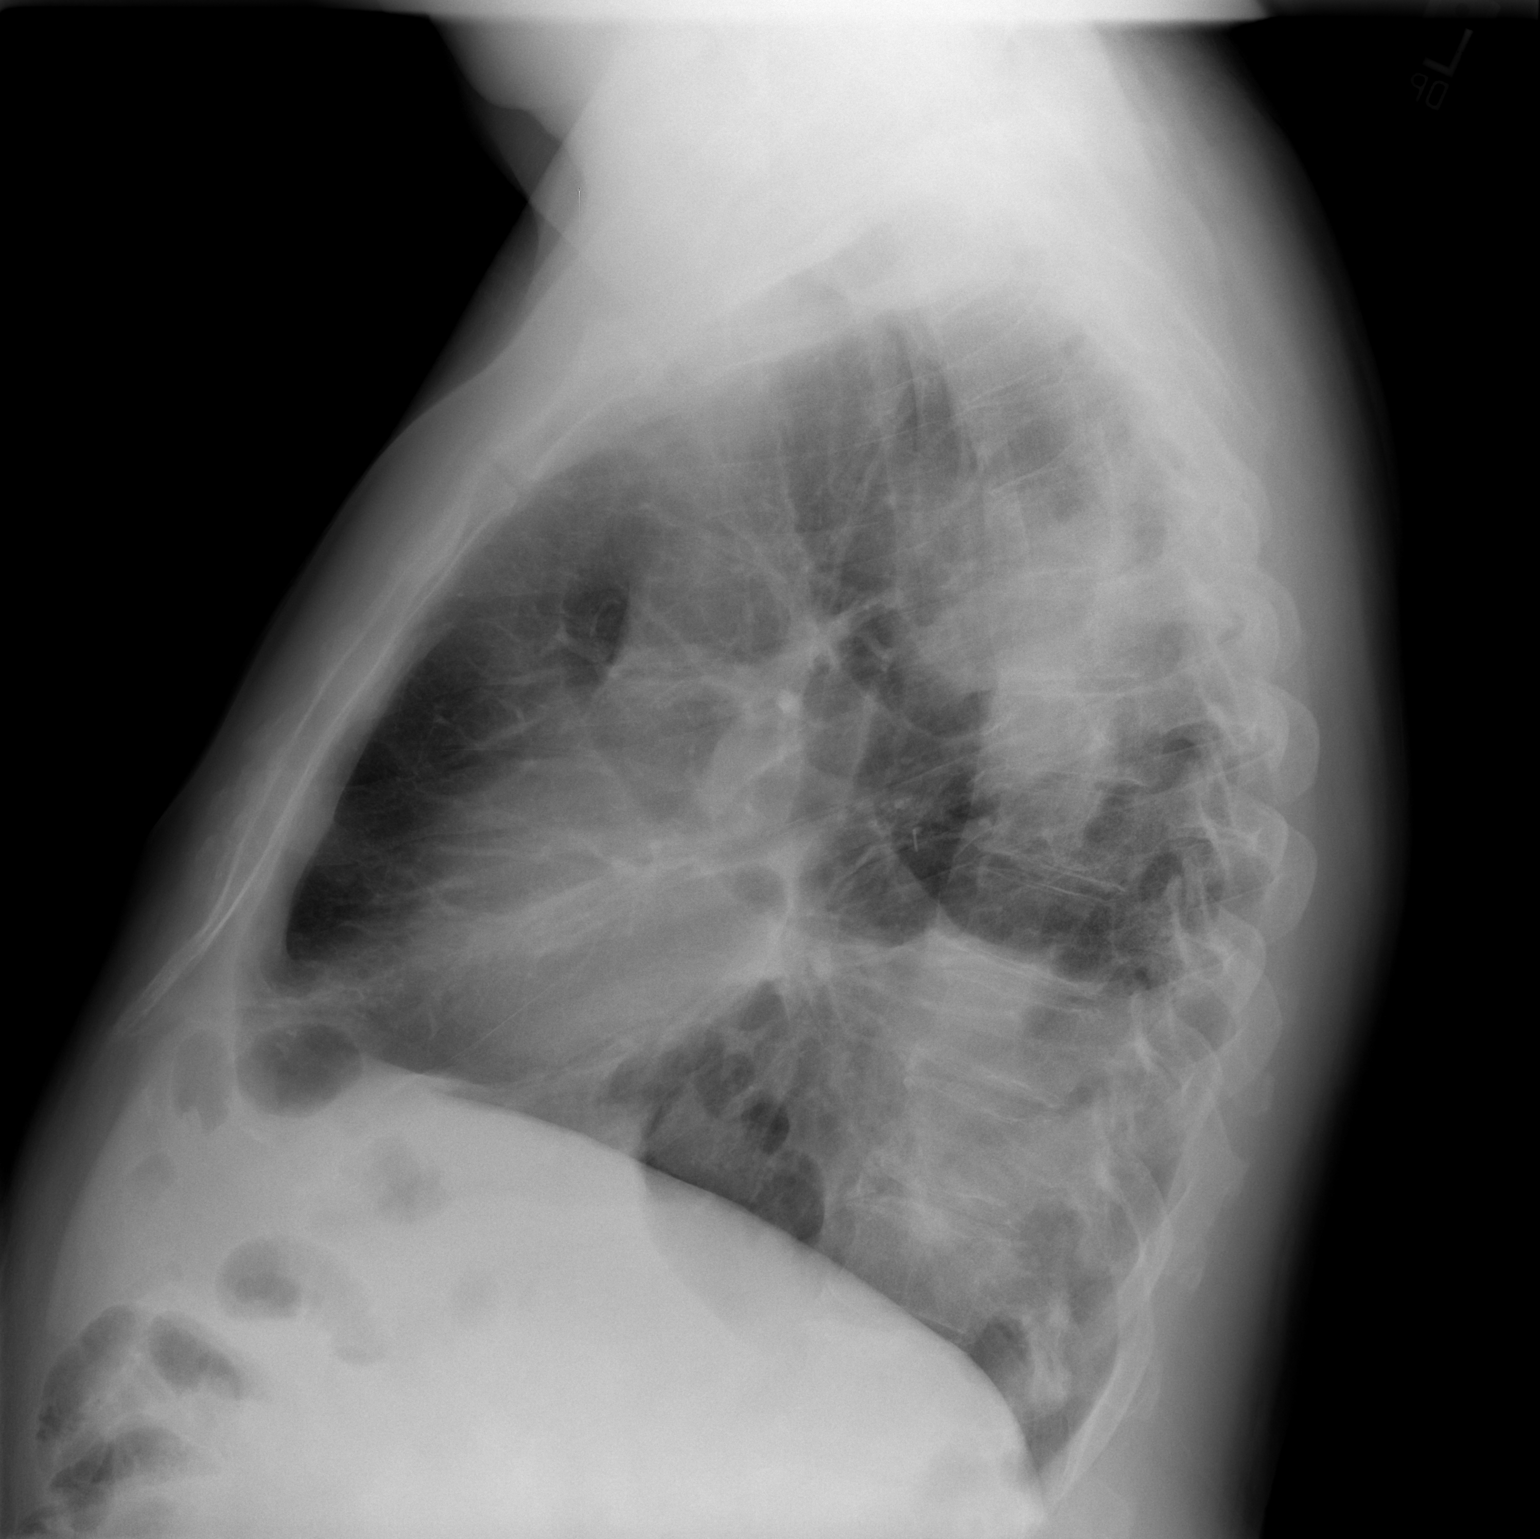

[2 of 2 positions shown; findings below may reference images not displayed]

FINDINGS: Postoperative changes of wedge resection in the left
upper lobe are again noted.  Extensive architectural distortion and
chronic pleural thickening in the left hemithorax.  Overall, there
is improved aeration compared to the prior study throughout the
left lung.  Persistent elevation of the left hemidiaphragm and
chronic left pleural effusion.  Right lung is clear.  Heart size is
within normal limits.  Mild leftward shift of cardiomediastinal
structures, similar to the prior study.  Previously noted right-
sided subclavian catheter has been removed.  Atherosclerosis in the
thoracic aorta.
IMPRESSION: Evolving postoperative changes of the left hemithorax, as above,
with improved aeration throughout the left lung compared to the
prior study.

## 2014-02-02 ENCOUNTER — Other Ambulatory Visit: Payer: Self-pay | Admitting: Internal Medicine

## 2014-02-02 ENCOUNTER — Telehealth: Payer: Self-pay | Admitting: *Deleted

## 2014-02-02 DIAGNOSIS — C3490 Malignant neoplasm of unspecified part of unspecified bronchus or lung: Secondary | ICD-10-CM

## 2014-02-02 MED ORDER — OXYCODONE-ACETAMINOPHEN 5-325 MG PO TABS: 1.0000 | ORAL_TABLET | Freq: Four times a day (QID) | ORAL | Status: DC | PRN

## 2014-02-02 NOTE — Telephone Encounter (Signed)
No new Note.  Searching chrt for information with patient refill request received on MyChart.

## 2014-02-22 ENCOUNTER — Telehealth: Payer: Self-pay | Admitting: Internal Medicine

## 2014-02-22 NOTE — Telephone Encounter (Signed)
per MM to chge appt-cld pt & tlkwd w/Zandra(daughter)gave pt new time and date/will mail copy of sch to pt

## 2014-02-25 ENCOUNTER — Encounter (HOSPITAL_COMMUNITY): Payer: Self-pay

## 2014-02-25 ENCOUNTER — Other Ambulatory Visit: Payer: Self-pay | Admitting: Specialist

## 2014-02-25 DIAGNOSIS — Z9889 Other specified postprocedural states: Secondary | ICD-10-CM

## 2014-02-28 ENCOUNTER — Ambulatory Visit
Admission: RE | Admit: 2014-02-28 | Discharge: 2014-02-28 | Disposition: A | Discharge status: Home / Self Care | Payer: BC Managed Care – PPO | Source: Ambulatory Visit | Attending: Specialist | Admitting: Specialist

## 2014-02-28 DIAGNOSIS — Z9889 Other specified postprocedural states: Secondary | ICD-10-CM

## 2014-03-03 ENCOUNTER — Other Ambulatory Visit: Payer: Self-pay | Admitting: Internal Medicine

## 2014-03-04 ENCOUNTER — Telehealth: Payer: Self-pay | Admitting: *Deleted

## 2014-03-04 ENCOUNTER — Other Ambulatory Visit: Payer: Self-pay | Admitting: *Deleted

## 2014-03-04 DIAGNOSIS — C3490 Malignant neoplasm of unspecified part of unspecified bronchus or lung: Secondary | ICD-10-CM

## 2014-03-04 MED ORDER — OXYCODONE-ACETAMINOPHEN 5-325 MG PO TABS: 1.0000 | ORAL_TABLET | Freq: Four times a day (QID) | ORAL | Status: DC | PRN

## 2014-03-04 MED ORDER — ALPRAZOLAM 0.25 MG PO TABS: 0.2500 mg | ORAL_TABLET | Freq: Two times a day (BID) | ORAL | Status: DC | PRN

## 2014-03-04 NOTE — Telephone Encounter (Signed)
Pt sent email message via MyChart requesting refill of Percocet and Xanax.  Prescriptions signed by Dr. Julien Nordmann.  Called pt at home and spoke with daughter.  Informed daughter that pt has 2 prescriptions available that pt can pick up today before 4pm.  Daughter stated she would relay message to pt.

## 2014-03-09 ENCOUNTER — Other Ambulatory Visit: Payer: Self-pay | Admitting: *Deleted

## 2014-03-09 DIAGNOSIS — C3490 Malignant neoplasm of unspecified part of unspecified bronchus or lung: Secondary | ICD-10-CM

## 2014-03-10 ENCOUNTER — Other Ambulatory Visit: Payer: Self-pay | Admitting: *Deleted

## 2014-03-10 DIAGNOSIS — C3490 Malignant neoplasm of unspecified part of unspecified bronchus or lung: Secondary | ICD-10-CM

## 2014-03-18 ENCOUNTER — Other Ambulatory Visit: Payer: BC Managed Care – PPO

## 2014-03-20 IMAGING — CR DG CHEST 2V
2 series · 2 of 2 positions shown · non-contrast
Comparison: 07/01/2013 and earlier.

CLINICAL DATA: 57-year-old male with left lung cancer.

EXAM:
CHEST  2 VIEW

[w chest lat]
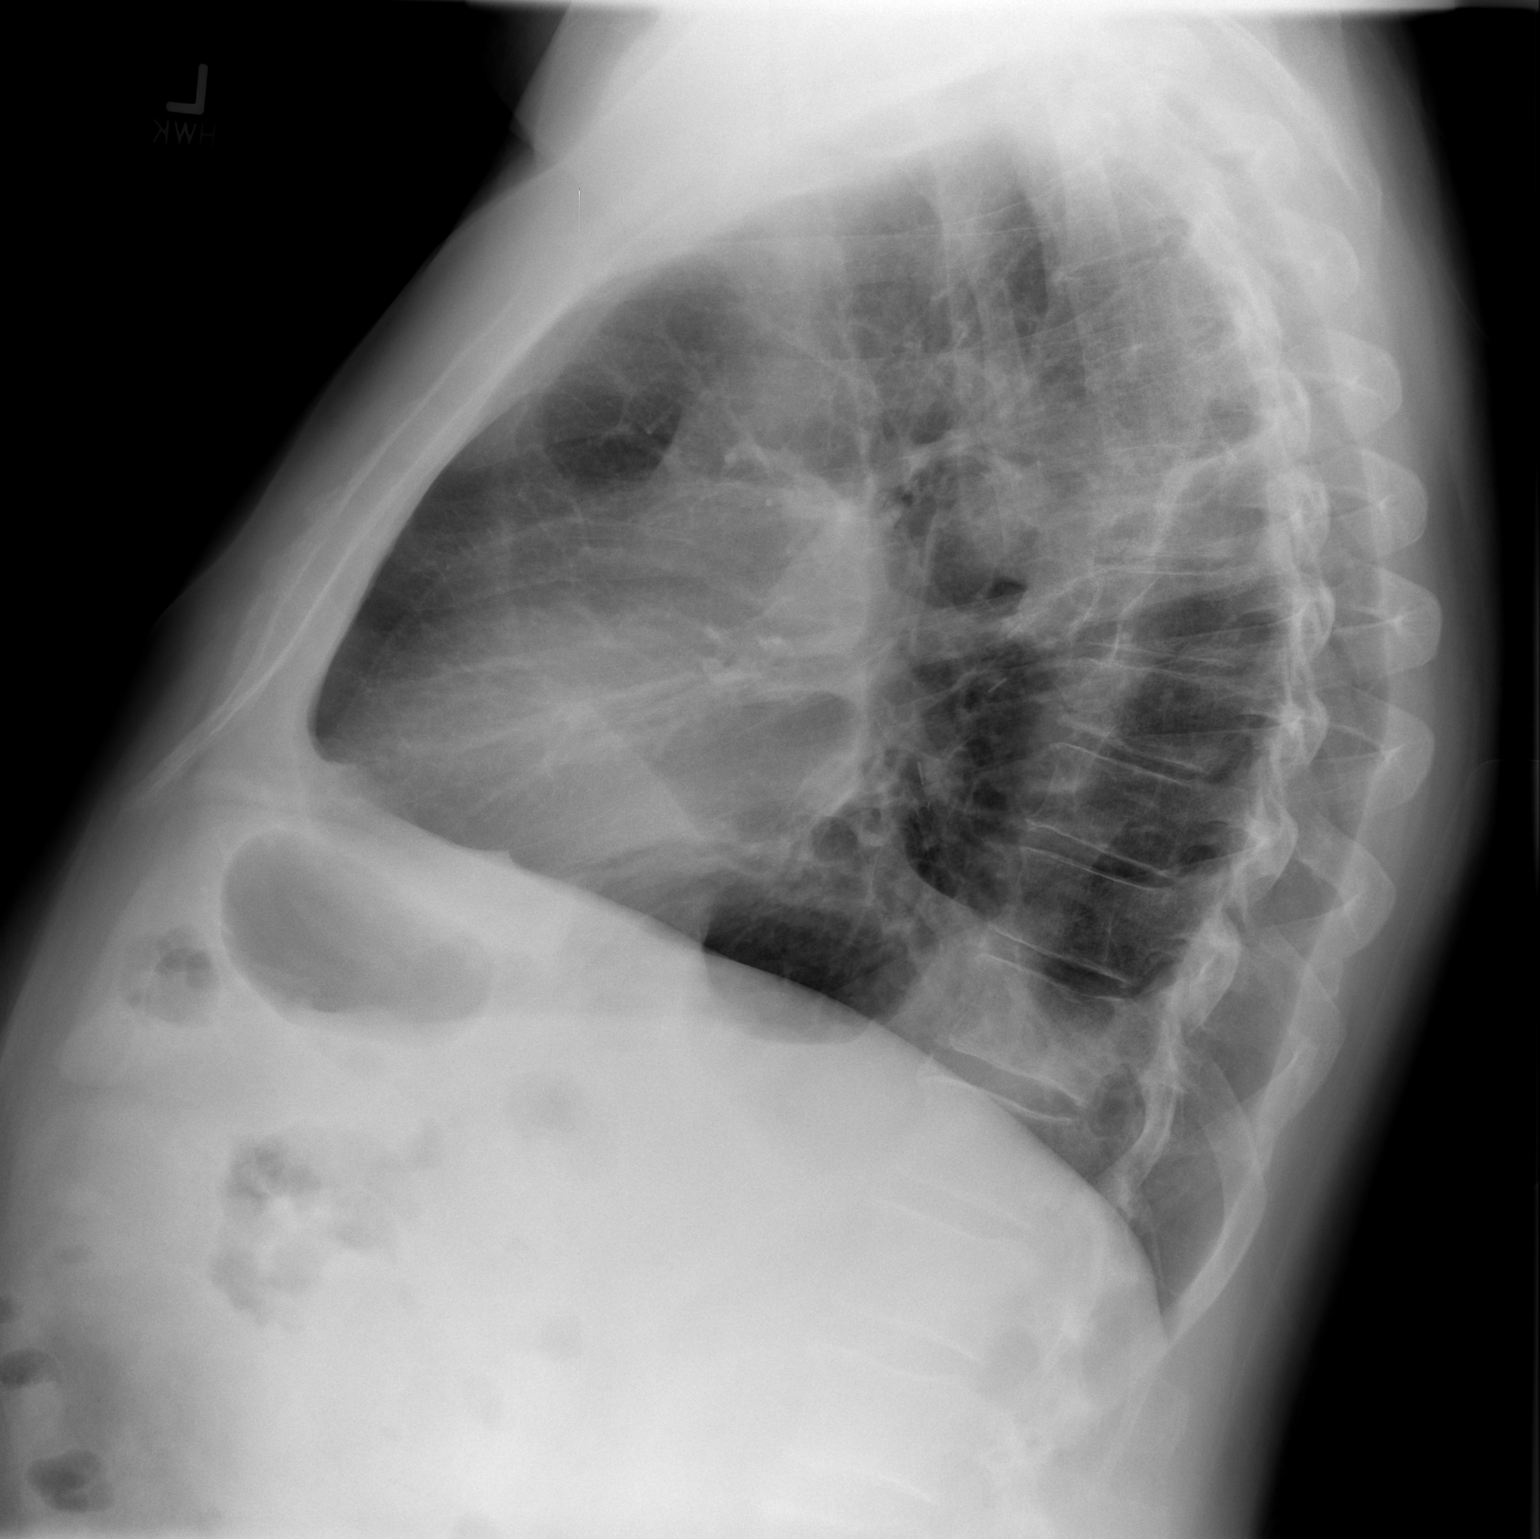

[w chest pa]
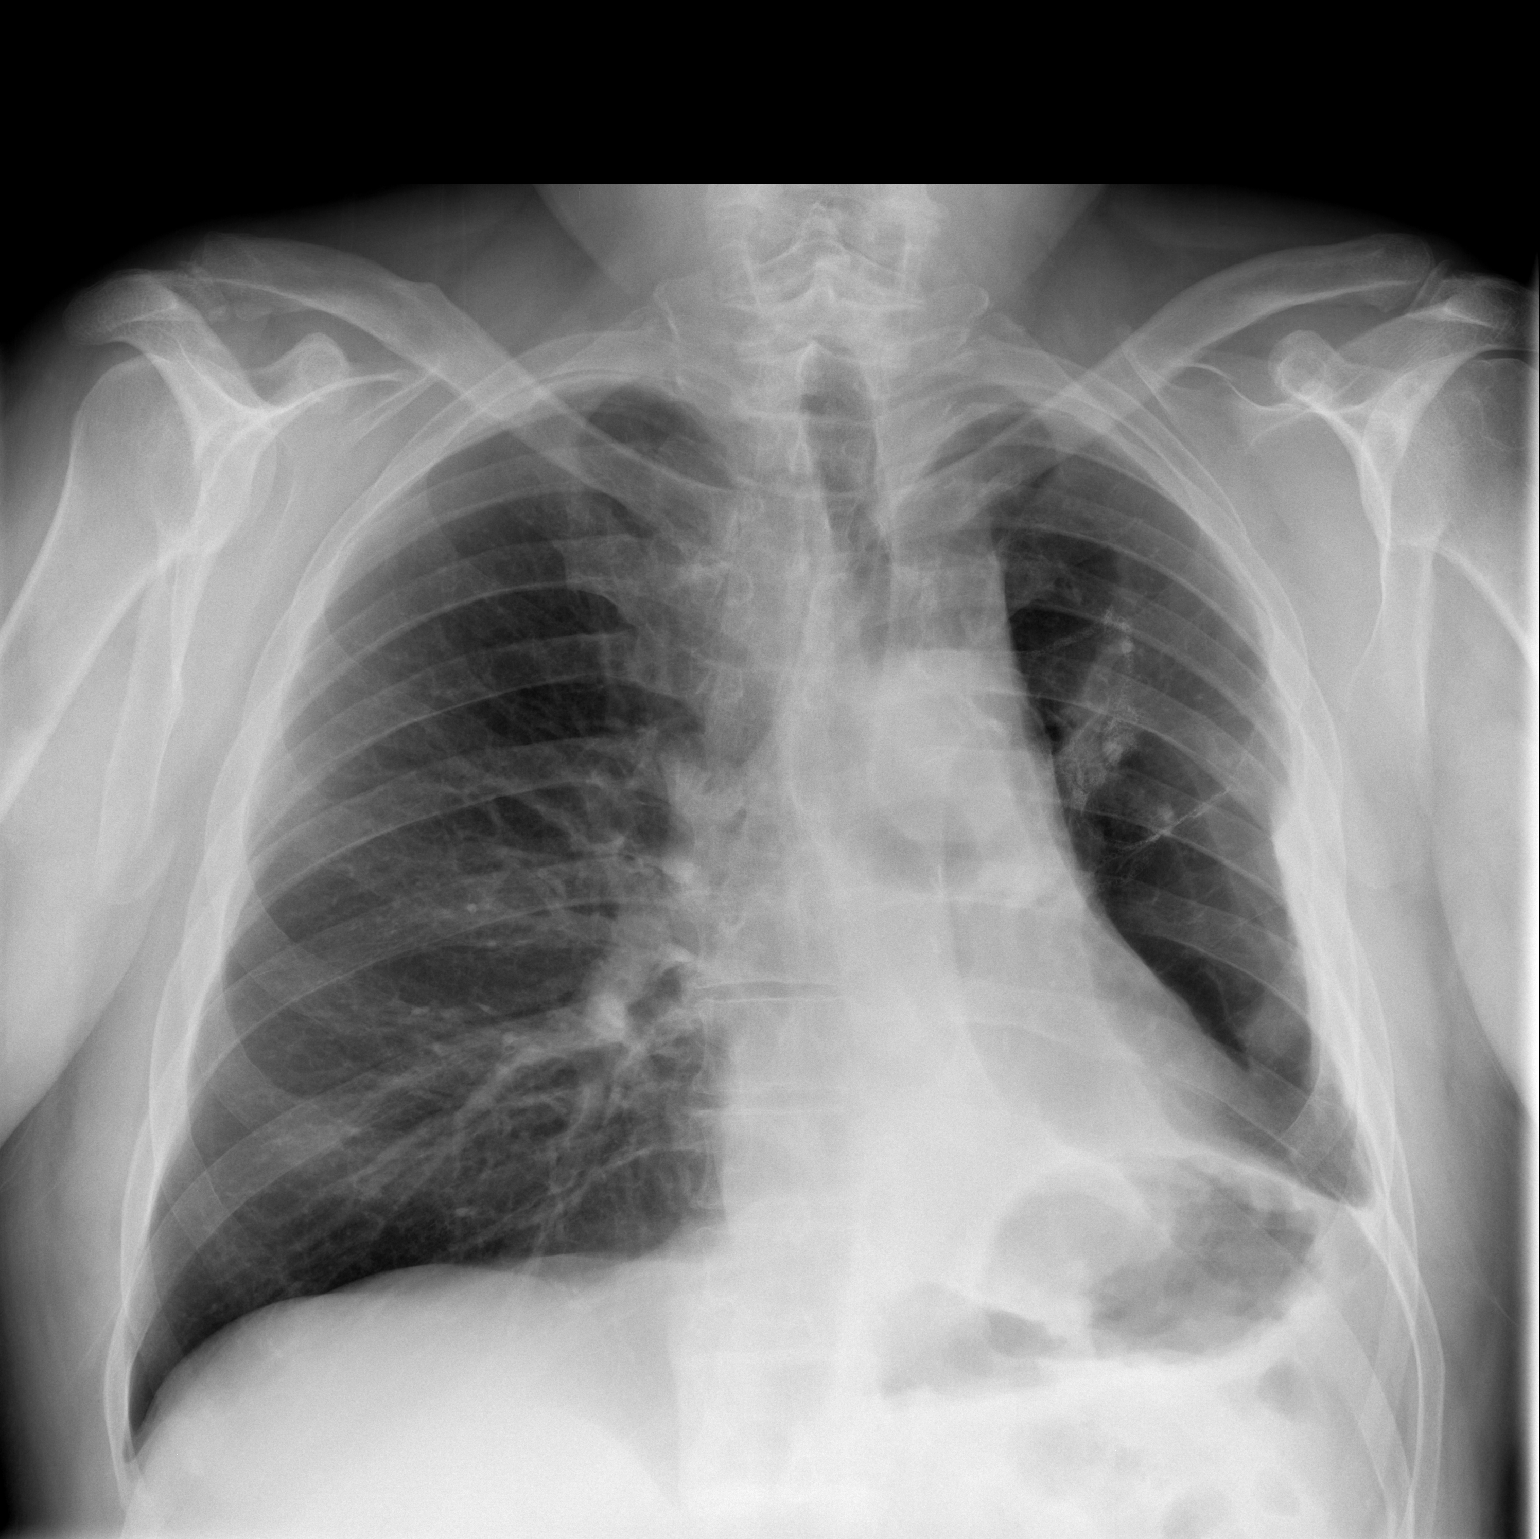

[2 of 2 positions shown; findings below may reference images not displayed]

FINDINGS: Postoperative changes in the left lung with volume loss. Interval
improved residual left lung ventilation. Mild nodular opacity along
staple lines in the left lung not significantly changed. Stable
cardiac size and mediastinal contours. Visualized tracheal air
column is within normal limits. The right lung is clear. No pleural
effusion. No acute osseous abnormality identified. Left lateral rib
likely postoperative changes.
IMPRESSION: Postoperative changes to the left lung with interval improved
ventilation. Continued attention directed on follow up exams to
residual nodular opacity along the left lung staple lines.

## 2014-03-25 ENCOUNTER — Ambulatory Visit (HOSPITAL_COMMUNITY)
Admission: RE | Admit: 2014-03-25 | Discharge: 2014-03-25 | Disposition: A | Discharge status: Home / Self Care | Payer: BC Managed Care – PPO | Source: Ambulatory Visit | Attending: Internal Medicine | Admitting: Internal Medicine

## 2014-03-25 ENCOUNTER — Encounter (HOSPITAL_COMMUNITY): Payer: Self-pay

## 2014-03-25 ENCOUNTER — Other Ambulatory Visit (HOSPITAL_BASED_OUTPATIENT_CLINIC_OR_DEPARTMENT_OTHER): Payer: BC Managed Care – PPO

## 2014-03-25 DIAGNOSIS — C3490 Malignant neoplasm of unspecified part of unspecified bronchus or lung: Secondary | ICD-10-CM | POA: Insufficient documentation

## 2014-03-25 DIAGNOSIS — C3430 Malignant neoplasm of lower lobe, unspecified bronchus or lung: Secondary | ICD-10-CM

## 2014-03-25 DIAGNOSIS — R918 Other nonspecific abnormal finding of lung field: Secondary | ICD-10-CM | POA: Insufficient documentation

## 2014-03-25 LAB — COMPREHENSIVE METABOLIC PANEL (CC13)
ALT: 24 U/L (ref 0–55)
ANION GAP: 10 meq/L (ref 3–11)
AST: 16 U/L (ref 5–34)
Albumin: 3.7 g/dL (ref 3.5–5.0)
Alkaline Phosphatase: 90 U/L (ref 40–150)
BILIRUBIN TOTAL: 0.27 mg/dL (ref 0.20–1.20)
BUN: 27.6 mg/dL — AB (ref 7.0–26.0)
CO2: 21 meq/L — AB (ref 22–29)
CREATININE: 1.8 mg/dL — AB (ref 0.7–1.3)
Calcium: 9.4 mg/dL (ref 8.4–10.4)
Chloride: 109 mEq/L (ref 98–109)
Glucose: 100 mg/dl (ref 70–140)
Potassium: 5.1 mEq/L (ref 3.5–5.1)
Sodium: 140 mEq/L (ref 136–145)
Total Protein: 7 g/dL (ref 6.4–8.3)

## 2014-03-25 LAB — CBC WITH DIFFERENTIAL/PLATELET
BASO%: 0.6 % (ref 0.0–2.0)
Basophils Absolute: 0.1 10*3/uL (ref 0.0–0.1)
EOS%: 2.4 % (ref 0.0–7.0)
Eosinophils Absolute: 0.2 10*3/uL (ref 0.0–0.5)
HCT: 36.9 % — ABNORMAL LOW (ref 38.4–49.9)
HGB: 12 g/dL — ABNORMAL LOW (ref 13.0–17.1)
LYMPH%: 28 % (ref 14.0–49.0)
MCH: 27.3 pg (ref 27.2–33.4)
MCHC: 32.6 g/dL (ref 32.0–36.0)
MCV: 83.8 fL (ref 79.3–98.0)
MONO#: 1 10*3/uL — ABNORMAL HIGH (ref 0.1–0.9)
MONO%: 9.6 % (ref 0.0–14.0)
NEUT#: 5.9 10*3/uL (ref 1.5–6.5)
NEUT%: 59.4 % (ref 39.0–75.0)
Platelets: 288 10*3/uL (ref 140–400)
RBC: 4.4 10*6/uL (ref 4.20–5.82)
RDW: 13.9 % (ref 11.0–14.6)
WBC: 10 10*3/uL (ref 4.0–10.3)
lymph#: 2.8 10*3/uL (ref 0.9–3.3)

## 2014-03-25 MED ORDER — IOHEXOL 300 MG/ML  SOLN
80.0000 mL | Freq: Once | INTRAMUSCULAR | Status: AC | PRN
  Administered 2014-03-25: 80 mL via INTRAVENOUS

## 2014-03-28 ENCOUNTER — Ambulatory Visit: Payer: BC Managed Care – PPO | Admitting: Internal Medicine

## 2014-03-29 ENCOUNTER — Ambulatory Visit (HOSPITAL_BASED_OUTPATIENT_CLINIC_OR_DEPARTMENT_OTHER): Payer: BC Managed Care – PPO | Admitting: Internal Medicine

## 2014-03-29 ENCOUNTER — Telehealth: Payer: Self-pay | Admitting: Internal Medicine

## 2014-03-29 ENCOUNTER — Encounter: Payer: Self-pay | Admitting: Internal Medicine

## 2014-03-29 VITALS — BP 147/87 | HR 69 | Temp 97.7°F | Resp 18 | Ht 73.0 in | Wt 251.0 lb

## 2014-03-29 DIAGNOSIS — J449 Chronic obstructive pulmonary disease, unspecified: Secondary | ICD-10-CM

## 2014-03-29 DIAGNOSIS — C3430 Malignant neoplasm of lower lobe, unspecified bronchus or lung: Secondary | ICD-10-CM

## 2014-03-29 DIAGNOSIS — R944 Abnormal results of kidney function studies: Secondary | ICD-10-CM

## 2014-03-29 DIAGNOSIS — G5790 Unspecified mononeuropathy of unspecified lower limb: Secondary | ICD-10-CM

## 2014-03-29 DIAGNOSIS — C3490 Malignant neoplasm of unspecified part of unspecified bronchus or lung: Secondary | ICD-10-CM

## 2014-03-29 MED ORDER — OXYCODONE-ACETAMINOPHEN 5-325 MG PO TABS: 1.0000 | ORAL_TABLET | Freq: Four times a day (QID) | ORAL | Status: DC | PRN

## 2014-03-29 NOTE — Telephone Encounter (Signed)
gv and printed appt sched and avs for pt for May and Aug...pt sched to See Dr Lamonte Sakai on 5.28 @ 10:30am

## 2014-03-29 NOTE — Progress Notes (Signed)
[facility] [phone]) [phone]   [phone]  OFFICE PROGRESS NOTE  [name], MD Dr. 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  2) stage IIB (T3, N1, M0) non-small cell lung cancer, adenocarcinoma diagnosed in June of 2014.   PRIOR THERAPY:  1) Status post 3 cycles of systemic chemotherapy with carboplatin and etoposide. Last dose was given on 07/16/2011.  2) Left video-assisted thoracoscopy, mini thoracotomy; wedge, posterior segment of left upper lobe; wedge, anterior segment of left lower lobe with node dissection.  3) Redo left video-assisted thoracoscopy, lysis of adhesions, wedge resection of left lower lobe, thoracoscopic left lower lobectomy under the care of Dr. Roxan Hockey on 05/03/2013.  4) Adjuvant chemotherapy with cisplatin 75 mg/M2 and Alimta 500 mg/M2 every 3 weeks, first dose given 06/28/2013. Status post 4 cycles.  CURRENT THERAPY: Observation.  CHEMOTHERAPY INTENT: Adjuvant/Curative.  CURRENT # OF CHEMOTHERAPY CYCLES: 0 CURRENT ANTIEMETICS: N/A  CURRENT SMOKING STATUS: Quit smoking 05/02/13  ORAL CHEMOTHERAPY AND CONSENT: None  CURRENT BISPHOSPHONATES USE: None  PAIN MANAGEMENT: 2/10  NARCOTICS INDUCED CONSTIPATION: None  LIVING WILL AND CODE STATUS: Full Code  INTERVAL HISTORY: [name] [name] 58 y.o. male returns to the clinic today for followup visit. The patient is feeling fine today with no specific complaints except for mild peripheral neuropathy mainly in the toes shortness of breath with exertion. and left-sided chest pain secondary to his previous surgery. The patient denied having any fever or chills. He denied having any cough or hemoptysis. He requested a refill of Percocet as well as the cough medicine. He has no weight loss or night sweats. He has repeat CT scan of  the chest performed recently and he is here for evaluation and discussion of his scan results.  MEDICAL HISTORY: Past Medical History  Diagnosis Date  . Hypercholesteremia     takes Pravastatin nightly  . HTN (hypertension)     takes Lisinopril and HCTZ daily  . Shortness of breath     with exertion;Albuterol inhaler prn  . Insomnia     takes Xanax prn  . Lung cancer     ALLERGIES:  has No Known Allergies.  MEDICATIONS:  Current Outpatient Prescriptions  Medication Sig Dispense Refill  . albuterol (PROVENTIL HFA;VENTOLIN HFA) 108 (90 BASE) MCG/ACT inhaler Inhale 1-2 puffs into the lungs every 4 (four) hours as needed for wheezing.      Marland Kitchen ALPRAZolam (XANAX) 0.25 MG tablet Take 1 tablet (0.25 mg total) by mouth 2 (two) times daily as needed for anxiety.  60 tablet  0  . aspirin EC 81 MG tablet Take 81 mg by mouth every morning.      . benzonatate (TESSALON PERLES) 100 MG capsule Take 1 capsule (100 mg total) by mouth 3 (three) times daily as needed for cough.  30 capsule  0  . lisinopril-hydrochlorothiazide (PRINZIDE,ZESTORETIC) 20-25 MG per tablet Take 1 tablet by mouth every morning.       Marland Kitchen oxyCODONE-acetaminophen (PERCOCET/ROXICET) 5-325 MG per tablet Take 1 tablet by mouth every 6 (six) hours as needed for severe pain.  60 tablet  0  . pravastatin (PRAVACHOL) 40 MG tablet Take 40 mg by mouth at bedtime.        No current facility-administered medications for this visit.    SURGICAL HISTORY:  Past Surgical History  Procedure  Laterality Date  . Left video-assisted thoracoscopy, mini thoracotomy; wedge, posterior segment of left upper lobe; wedge, anterior segment of left lower lobe with node dissection.  09/02/11    [facility]  . Video assisted thoracoscopy Left 05/03/2013    Procedure: REDO VIDEO ASSISTED THORACOSCOPY;  Surgeon: Melrose Nakayama, MD;  Location: [facility];  Service: Thoracic;  Laterality: Left;  REDO LEFT VATS  . Thoracotomy Left 05/03/2013    Procedure:  THORACOTOMY MAJOR;  Surgeon: Melrose Nakayama, MD;  Location: [facility];  Service: Thoracic;  Laterality: Left;  . Lobectomy Left 05/03/2013    Procedure: LOBECTOMY;  Surgeon: Melrose Nakayama, MD;  Location: [facility];  Service: Thoracic;  Laterality: Left;  . Wedge resection Left 05/03/2013    Procedure: WEDGE RESECTION;  Surgeon: Melrose Nakayama, MD;  Location: [facility];  Service: Thoracic;  Laterality: Left;  . Open reduction internal fixation (orif) tibia/fibula fracture Right 07/01/2013    Procedure: OPEN REDUCTION INTERNAL FIXATION TIBIAL  FRACTURE;  Surgeon: Johnn Hai, MD;  Location: [facility];  Service: Orthopedics;  Laterality: Right;    REVIEW OF SYSTEMS:  Constitutional: negative Eyes: negative Ears, nose, mouth, throat, and face: negative Respiratory: positive for dyspnea on exertion Cardiovascular: negative Gastrointestinal: negative Genitourinary:negative Integument/breast: negative Hematologic/lymphatic: negative Musculoskeletal:negative Neurological: negative Behavioral/Psych: negative Endocrine: negative Allergic/Immunologic: negative   PHYSICAL EXAMINATION: General appearance: alert, cooperative, fatigued and no distress Head: Normocephalic, without obvious abnormality, atraumatic Neck: no adenopathy, no JVD, supple, symmetrical, trachea midline and thyroid not enlarged, symmetric, no tenderness/mass/nodules Lymph nodes: Cervical, supraclavicular, and axillary nodes normal. Resp: clear to auscultation bilaterally Back: symmetric, no curvature. ROM normal. No CVA tenderness. Cardio: regular rate and rhythm, S1, S2 normal, no murmur, click, rub or gallop GI: soft, non-tender; bowel sounds normal; no masses,  no organomegaly Extremities: extremities normal, atraumatic, no cyanosis or edema Neurologic: Alert and oriented X 3, normal strength and tone. Normal symmetric reflexes. Normal coordination and gait  ECOG PERFORMANCE STATUS: 1 - Symptomatic but completely  ambulatory  Blood pressure 147/87, pulse 69, temperature 97.7 F (36.5 C), temperature source Oral, resp. rate 18, height 6\' 1"  (1.854 m), weight 251 lb (113.853 kg).  LABORATORY DATA: Lab Results  Component Value Date   WBC 10.0 03/25/2014   HGB 12.0* 03/25/2014   HCT 36.9* 03/25/2014   MCV 83.8 03/25/2014   PLT 288 03/25/2014      Chemistry      Component Value Date/Time   NA 140 03/25/2014 0835   NA 139 09/21/2013 1438   NA 138 03/25/2012 1225   K 5.1 03/25/2014 0835   K 3.8 09/21/2013 1438   K 4.2 03/25/2012 1225   CL 102 09/21/2013 1438   CL 105 03/03/2013 1002   CL 98 03/25/2012 1225   CO2 21* 03/25/2014 0835   CO2 32 07/04/2013 0455   CO2 26 03/25/2012 1225   BUN 27.6* 03/25/2014 0835   BUN 15 09/21/2013 1438   BUN 11 03/25/2012 1225   CREATININE 1.8* 03/25/2014 0835   CREATININE 1.00 09/21/2013 1438   CREATININE 1.0 03/25/2012 1225      Component Value Date/Time   CALCIUM 9.4 03/25/2014 0835   CALCIUM 9.1 07/04/2013 0455   CALCIUM 8.6 03/25/2012 1225   ALKPHOS 90 03/25/2014 0835   ALKPHOS 51 07/02/2013 0455   ALKPHOS 84 03/25/2012 1225   AST 16 03/25/2014 0835   AST 23 07/02/2013 0455   AST 33 03/25/2012 1225   ALT 24 03/25/2014 0835   ALT  34 07/02/2013 0455   ALT 43 03/25/2012 1225   BILITOT 0.27 03/25/2014 0835   BILITOT 0.5 07/02/2013 0455   BILITOT 0.80 03/25/2012 1225       RADIOGRAPHIC STUDIES: Ct Chest W Contrast  03/25/2014   CLINICAL DATA:  Lung cancer followup.  EXAM: CT CHEST WITH CONTRAST  TECHNIQUE: Multidetector CT imaging of the chest was performed during intravenous contrast administration.  CONTRAST:  96mL OMNIPAQUE IOHEXOL 300 MG/ML  SOLN  COMPARISON:  12/24/2013.  FINDINGS: Postoperative change and volume loss in the left upper lobe is noted. No pulmonary nodule or mass identified within the left lung. Multifocal ground-glass attenuating nodules are again identified within the right lung. Index nodule in the superior segment of the right lower lobe measures 10 mm,  image 27/series 5 this is unchanged from previous exam. Solid nodule closer to the right lung base is unchanged measuring 5 mm, image 44/series 5. Right upper lobe ground-glass attenuating nodule is unchanged measuring 1.3 cm, image 24/ series 5. In the right apex there is a stable ground-glass nodule measuring 1 cm, image 10/series 5. No new or enlarging pulmonary nodules identified.  The heart size appears normal. There is no pericardial effusion. No enlarged mediastinal or hilar adenopathy.  Incidental imaging through the upper abdomen is on unremarkable. There is no acute finding identified.  Review of the visualized bony structures is negative for aggressive lytic or sclerotic bone lesion.  IMPRESSION: 1. Postsurgical and therapeutic changes involving the left lung. There are no specific features identified to suggest local Lung cancer recurrence or metastatic disease. 2. Stable ground-glass attenuating nodules within the right lung.   Electronically Signed   By: Kerby Moors M.D.   On: 03/25/2014 14:05   Ct Tibia Fibula Right Wo Contrast  02/28/2014   CLINICAL DATA:  Right lower leg pain. History of fracture fixation 07/01/2013.  EXAM: CT OF THE RIGHT TIBIA FIBULA WITHOUT CONTRAST  TECHNIQUE: Contiguous axial CT images were taken through the right lower leg without contrast administration. Coronal and sagittal reformatted images are also provided.  COMPARISON:  Plain films right ankle 07/01/2013.  FINDINGS: The patient is status post IM nailing of a fracture of the distal diaphysis of the tibia. The nail is intact. The distal interlocking screw is broken. The fracture is oblique in orientation extending from posterior superior to anteroinferior in the diaphysis of the tibia. There is some bridging bone superiorly about the posterior, lateral margin of the fracture and the anterior, medial margin of the inferior fracture line. 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 All visualized bones are  osteopenic. There is no acute fracture. Mild talonavicular degenerative change is present. Subcutaneous edema is noted about the lower leg. Musculature appears mildly atrophic. No focal soft tissue lesion is identified. As visualized by CT, all tendons appear intact. No ligament tear is identified.  IMPRESSION: Status post IM nailing of a diaphyseal fracture of the tibia. The distal screw is broken. 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  Osteopenia.  No acute abnormality.   Electronically Signed   By: Inge Rise M.D.   On: 02/28/2014 10:52    ASSESSMENT AND PLAN: This is a very pleasant 58 years old white male with recently diagnosed as stage IIB non-small cell lung cancer, adenocarcinoma status post resection and completed 4 cycles of adjuvant chemotherapy with cisplatin and Alimta.  He  tolerated the last cycle of his treatment fairly well.  His recent CT scan of the chest showed no significant evidence for disease progression. I discussed the scan results with the patient. He continues to have persistent elevation of his serum creatinine. I will continue to monitor this closely and may consider referring the patient to nephrology if persistent by the next blood work. For the shortness of breath and history of COPD, I referred the patient to pulmonary for evaluation.  I recommended for the patient to continue on observation for now with repeat CT scan of the chest without contrast in 3 months. He would come back for followup visit at that time. He was advised to call immediately if he has any concerning symptoms in the interval. He was given a refill for percocet for pain control. The patient voices understanding of current disease status and treatment options and is in agreement with the current care plan.  All questions were answered. The patient knows to call the clinic with any problems,  questions or concerns. We can certainly see the patient much sooner if necessary.  Disclaimer: This note was dictated with voice recognition software. Similar sounding words can inadvertently be transcribed and may not be corrected upon review.

## 2014-03-31 ENCOUNTER — Encounter: Payer: Self-pay | Admitting: Emergency Medicine

## 2014-04-07 ENCOUNTER — Encounter: Payer: Self-pay | Admitting: Emergency Medicine

## 2014-04-07 ENCOUNTER — Ambulatory Visit (INDEPENDENT_AMBULATORY_CARE_PROVIDER_SITE_OTHER): Payer: BC Managed Care – PPO | Admitting: Emergency Medicine

## 2014-04-07 VITALS — BP 142/72 | HR 79 | Ht 73.0 in | Wt 253.4 lb

## 2014-04-07 DIAGNOSIS — J449 Chronic obstructive pulmonary disease, unspecified: Secondary | ICD-10-CM | POA: Insufficient documentation

## 2014-04-07 MED ORDER — ALBUTEROL SULFATE (2.5 MG/3ML) 0.083% IN NEBU: 2.5000 mg | INHALATION_SOLUTION | RESPIRATORY_TRACT | Status: DC | PRN

## 2014-04-07 NOTE — Assessment & Plan Note (Signed)
Based on pre-surgery PFT's he has COPD. He is using SABA frequently and would benefit from long-acting BD - start Spiriva and follow progress - walking oximetry - albuterol HFA and via nebulizer at his request - rov 1 month to assess response - will likely need post-op PFT at some point

## 2014-04-07 NOTE — Addendum Note (Signed)
Addended by: Carlos American A on: 04/07/2014 11:20 AM   Modules accepted: Orders

## 2014-04-07 NOTE — Patient Instructions (Signed)
We will start Spiriva once a day Continue to have your albuterol available to use 2 puffs as needed for shortness of breath.  We will also arrange for you to have an albuterol nebulizer available to use as needed for shortness of breath.  Walking oximetry today

## 2014-04-07 NOTE — Progress Notes (Signed)
Subjective:    Patient ID: [name] [name], male    DOB: 1956/06/01, 58 y.o.   MRN: [id number]  HPI 58 yo man, former smoker (60 pk-yrs), hx poorly differentiated lung CA stage IB in 7/'12, then stage IIB adenoCA in 6/14. Underwent chemo, wedge resection LUL and LLL and then LLL lobectomy in 6/14. His last CT scan was 03/25/14 and was reassuring. He is referred by Dr Julien Nordmann for COPD and dyspnea.    He has noticed that his exertional tolerance has decreased, especially after his surgery in June '14. He has wheeze with exertion and also at rest. He has some cough but not a lot. Also with residual neuropathic L sided pain.   PFT 04/20/13  (pre-op)> severe AFL with positive BD response, normal volumes, normal DLCO.    Review of Systems  Constitutional: Negative for fever and unexpected weight change.  HENT: Positive for congestion. Negative for dental problem, ear pain, nosebleeds, postnasal drip, rhinorrhea, sinus pressure, sneezing, sore throat and trouble swallowing.   Eyes: Negative for redness and itching.  Respiratory: Positive for shortness of breath. Negative for cough, chest tightness and wheezing.   Cardiovascular: Positive for chest pain and leg swelling. Negative for palpitations.  Gastrointestinal: Negative for nausea and vomiting.  Genitourinary: Negative for dysuria.  Musculoskeletal: Negative for joint swelling.  Skin: Negative for rash.  Neurological: Negative for headaches.  Hematological: Does not bruise/bleed easily.  Psychiatric/Behavioral: Negative for dysphoric mood. The patient is not nervous/anxious.    Past Medical History  Diagnosis Date  . Hypercholesteremia     takes Pravastatin nightly  . HTN (hypertension)     takes Lisinopril and HCTZ daily  . Shortness of breath     with exertion;Albuterol inhaler prn  . Insomnia     takes Xanax prn  . Lung cancer      Family History  Problem Relation Age of Onset  . Cancer - Other Mother      History   Social  History  . Marital Status: Married    Spouse Name: N/A    Number of Children: N/A  . Years of Education: N/A   Occupational History  . disabled    Social History Main Topics  . Smoking status: Former Smoker -- 2.00 packs/day for 40 years    Types: Cigarettes    Quit date: 05/02/2013  . Smokeless tobacco: Former Systems developer    Types: Chew  . Alcohol Use: 0.0 oz/week     Comment: 24 beers/week  . Drug Use: No  . Sexual Activity: Yes   Other Topics Concern  . Not on file   Social History Narrative  . No narrative on file     No Known Allergies   Outpatient Prescriptions Prior to Visit  Medication Sig Dispense Refill  . albuterol (PROVENTIL HFA;VENTOLIN HFA) 108 (90 BASE) MCG/ACT inhaler Inhale 1-2 puffs into the lungs every 4 (four) hours as needed for wheezing.      Marland Kitchen ALPRAZolam (XANAX) 0.25 MG tablet Take 1 tablet (0.25 mg total) by mouth 2 (two) times daily as needed for anxiety.  60 tablet  0  . aspirin EC 81 MG tablet Take 81 mg by mouth every morning.      . benzonatate (TESSALON PERLES) 100 MG capsule Take 1 capsule (100 mg total) by mouth 3 (three) times daily as needed for cough.  30 capsule  0  . lisinopril-hydrochlorothiazide (PRINZIDE,ZESTORETIC) 20-25 MG per tablet Take 1 tablet by mouth every morning.       Marland Kitchen  oxyCODONE-acetaminophen (PERCOCET/ROXICET) 5-325 MG per tablet Take 1 tablet by mouth every 6 (six) hours as needed for severe pain.  60 tablet  0  . pravastatin (PRAVACHOL) 40 MG tablet Take 40 mg by mouth at bedtime.        No facility-administered medications prior to visit.         Objective:   Physical Exam Filed Vitals:   04/07/14 1017  BP: 142/72  Pulse: 79  Height: 6\' 1"  (1.854 m)  Weight: 253 lb 6.4 oz (114.941 kg)  SpO2: 98%   Gen: Pleasant, well-nourished, in no distress,  normal affect  ENT: No lesions,  mouth clear,  oropharynx clear, no postnasal drip  Neck: No JVD, no TMG, no carotid bruits  Lungs: No use of accessory muscles, very  decreased on the L, clear without rales or rhonchi  Cardiovascular: RRR, heart sounds normal, no murmur or gallops, no peripheral edema  Musculoskeletal: symmetric edema on the R LE post-leg fracture, no cyanosis or clubbing  Neuro: alert, non focal  Skin: Warm, no lesions or rashes     03/25/14 --  COMPARISON: 12/24/2013.  FINDINGS:  Postoperative change and volume loss in the left upper lobe is  noted. No pulmonary nodule or mass identified within the left lung.  Multifocal ground-glass attenuating nodules are again identified  within the right lung. Index nodule in the superior segment of the  right lower lobe measures 10 mm, image 27/series 5 this is unchanged  from previous exam. Solid nodule closer to the right lung base is  unchanged measuring 5 mm, image 44/series 5. Right upper lobe  ground-glass attenuating nodule is unchanged measuring 1.3 cm, image  24/ series 5. In the right apex there is a stable ground-glass  nodule measuring 1 cm, image 10/series 5. No new or enlarging  pulmonary nodules identified.  The heart size appears normal. There is no pericardial effusion. No  enlarged mediastinal or hilar adenopathy.  Incidental imaging through the upper abdomen is on unremarkable.  There is no acute finding identified.  Review of the visualized bony structures is negative for aggressive  lytic or sclerotic bone lesion.  IMPRESSION:  1. Postsurgical and therapeutic changes involving the left lung.  There are no specific features identified to suggest local Lung  cancer recurrence or metastatic disease.  2. Stable ground-glass attenuating nodules within the right lung      Assessment & Plan:  COPD (chronic obstructive pulmonary disease) Based on pre-surgery PFT's he has COPD. He is using SABA frequently and would benefit from long-acting BD - start Spiriva and follow progress - walking oximetry - albuterol HFA and via nebulizer at his request - rov 1 month to  assess response - will likely need post-op PFT at some point

## 2014-04-21 ENCOUNTER — Other Ambulatory Visit: Payer: Self-pay | Admitting: Internal Medicine

## 2014-04-21 DIAGNOSIS — C3490 Malignant neoplasm of unspecified part of unspecified bronchus or lung: Secondary | ICD-10-CM

## 2014-04-21 MED ORDER — OXYCODONE-ACETAMINOPHEN 5-325 MG PO TABS: 1.0000 | ORAL_TABLET | Freq: Four times a day (QID) | ORAL | Status: DC | PRN

## 2014-04-21 NOTE — Telephone Encounter (Signed)
NOTIFIED PT.'S WIFE THAT HIS PRESCRIPTION IS READY.

## 2014-04-26 ENCOUNTER — Other Ambulatory Visit: Payer: BC Managed Care – PPO

## 2014-04-26 ENCOUNTER — Encounter: Payer: Self-pay | Admitting: Thoracic Surgery (Cardiothoracic Vascular Surgery)

## 2014-04-26 ENCOUNTER — Ambulatory Visit (INDEPENDENT_AMBULATORY_CARE_PROVIDER_SITE_OTHER): Payer: BC Managed Care – PPO | Admitting: Thoracic Surgery (Cardiothoracic Vascular Surgery)

## 2014-04-26 ENCOUNTER — Encounter: Payer: Self-pay | Admitting: Emergency Medicine

## 2014-04-26 ENCOUNTER — Ambulatory Visit (INDEPENDENT_AMBULATORY_CARE_PROVIDER_SITE_OTHER): Payer: BC Managed Care – PPO | Admitting: Emergency Medicine

## 2014-04-26 ENCOUNTER — Ambulatory Visit: Payer: BC Managed Care – PPO | Admitting: Thoracic Surgery (Cardiothoracic Vascular Surgery)

## 2014-04-26 VITALS — BP 128/80 | HR 106 | Ht 73.0 in | Wt 255.0 lb

## 2014-04-26 VITALS — BP 158/98 | HR 84 | Resp 21 | Ht 73.0 in | Wt 255.0 lb

## 2014-04-26 DIAGNOSIS — R911 Solitary pulmonary nodule: Secondary | ICD-10-CM

## 2014-04-26 DIAGNOSIS — J449 Chronic obstructive pulmonary disease, unspecified: Secondary | ICD-10-CM

## 2014-04-26 NOTE — Assessment & Plan Note (Signed)
Please continue Spiriva daily Use albuterol if needed for shortness of breath Don't forget to get your Flu Shot this Fall Follow with Dr Lamonte Sakai in 4 months or sooner if you have any problems.

## 2014-04-26 NOTE — Progress Notes (Signed)
Subjective:    Patient ID: [name] [name], male    DOB: 1956-03-09, 58 y.o.   MRN: [id number]  HPI 58 yo man, former smoker (42 pk-yrs), hx poorly differentiated lung CA stage IB in 7/'12, then stage IIB adenoCA in 6/14. Underwent chemo, wedge resection LUL and LLL and then LLL lobectomy in 6/14. His last CT scan was 03/25/14 and was reassuring. He is referred by Dr Julien Nordmann for COPD and dyspnea.    He has noticed that his exertional tolerance has decreased, especially after his surgery in June '14. He has wheeze with exertion and also at rest. He has some cough but not a lot. Also with residual neuropathic L sided pain.   PFT 04/20/13  (pre-op)> severe AFL with positive BD response, normal volumes, normal DLCO.   ROV 04/26/14 -- follow up for lung CA with rx as above, severe AFL and COPD. Last time we started Spiriva to see if he would benefit. He tells me that the spiriva has helped with his exertional SOB and his wheeze. His congestion has improved. He uses albuterol via neb a few times times a week. He is due for CT chest in August '15.   Review of Systems  Constitutional: Negative for fever and unexpected weight change.  HENT: Positive for congestion. Negative for dental problem, ear pain, nosebleeds, postnasal drip, rhinorrhea, sinus pressure, sneezing, sore throat and trouble swallowing.   Eyes: Negative for redness and itching.  Respiratory: Positive for shortness of breath. Negative for cough, chest tightness and wheezing.   Cardiovascular: Positive for chest pain and leg swelling. Negative for palpitations.  Gastrointestinal: Negative for nausea and vomiting.  Genitourinary: Negative for dysuria.  Musculoskeletal: Negative for joint swelling.  Skin: Negative for rash.  Neurological: Negative for headaches.  Hematological: Does not bruise/bleed easily.  Psychiatric/Behavioral: Negative for dysphoric mood. The patient is not nervous/anxious.        Objective:   Physical Exam Filed  Vitals:   04/26/14 1357  BP: 128/80  Pulse: 106  Height: 6\' 1"  (1.854 m)  Weight: 255 lb (115.667 kg)  SpO2: 98%   Gen: Pleasant, well-nourished, in no distress,  normal affect  ENT: No lesions,  mouth clear,  oropharynx clear, no postnasal drip  Neck: No JVD, no TMG, no carotid bruits  Lungs: No use of accessory muscles, very decreased on the L, clear without rales or rhonchi  Cardiovascular: RRR, heart sounds normal, no murmur or gallops, no peripheral edema  Musculoskeletal: symmetric edema on the R LE post-leg fracture, no cyanosis or clubbing  Neuro: alert, non focal  Skin: Warm, no lesions or rashes    03/25/14 --  COMPARISON: 12/24/2013.  FINDINGS:  Postoperative change and volume loss in the left upper lobe is  noted. No pulmonary nodule or mass identified within the left lung.  Multifocal ground-glass attenuating nodules are again identified  within the right lung. Index nodule in the superior segment of the  right lower lobe measures 10 mm, image 27/series 5 this is unchanged  from previous exam. Solid nodule closer to the right lung base is  unchanged measuring 5 mm, image 44/series 5. Right upper lobe  ground-glass attenuating nodule is unchanged measuring 1.3 cm, image  24/ series 5. In the right apex there is a stable ground-glass  nodule measuring 1 cm, image 10/series 5. No new or enlarging  pulmonary nodules identified.  The heart size appears normal. There is no pericardial effusion. No  enlarged mediastinal or hilar adenopathy.  Incidental imaging through the upper abdomen is on unremarkable.  There is no acute finding identified.  Review of the visualized bony structures is negative for aggressive  lytic or sclerotic bone lesion.  IMPRESSION:  1. Postsurgical and therapeutic changes involving the left lung.  There are no specific features identified to suggest local Lung  cancer recurrence or metastatic disease.  2. Stable ground-glass attenuating  nodules within the right lung      Assessment & Plan:  COPD (chronic obstructive pulmonary disease) Please continue Spiriva daily Use albuterol if needed for shortness of breath Don't forget to get your Flu Shot this Fall Follow with Dr Lamonte Sakai in 4 months or sooner if you have any problems.

## 2014-04-26 NOTE — Progress Notes (Signed)
HPI:  [name] returns today for a scheduled followup visit.  He is a 58 year old gentleman with a history of tobacco abuse. He had a stage IB lung cancer resected with a left upper lobe wedge resection by Dr. Arlyce Dice back in July 2012. He developed a second primary and I did a left lower lobectomy for a stage IIb (T3, N1) adenocarcinoma in June of 2014. He was treated with adjuvant chemotherapy. He is being followed by Dr. Julien Nordmann.  He had been having a lot of difficulty with breathing recently. Particularly is a whether it got more he was having severe wheezing. He saw Dr. Lamonte Sakai around the end of May and was started on Spiriva and albuterol. His symptoms have improved since then. He saw Dr. Lamonte Sakai again earlier today.  He says he has not smoked a cigarette in a year. He continues to have a cough. He does have wheezing, which is worse when the weather is hot and humid. His weight has been stable. He's not had any unusual headaches or visual changes. 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 This follows a severe tib-fib fracture which required a ORIF.  Past Medical History  Diagnosis Date  . Hypercholesteremia     takes Pravastatin nightly  . HTN (hypertension)     takes Lisinopril and HCTZ daily  . Shortness of breath     with exertion;Albuterol inhaler prn  . Insomnia     takes Xanax prn  . Lung cancer    COPD   Current Outpatient Prescriptions  Medication Sig Dispense Refill  . albuterol (PROVENTIL HFA;VENTOLIN HFA) 108 (90 BASE) MCG/ACT inhaler Inhale 1-2 puffs into the lungs every 4 (four) hours as needed for wheezing.      Marland Kitchen albuterol (PROVENTIL) (2.5 MG/3ML) 0.083% nebulizer solution Take 3 mLs (2.5 mg total) by nebulization every 4 (four) hours as needed for wheezing or shortness of breath.  75 mL  12  . ALPRAZolam (XANAX) 0.25 MG tablet Take 1 tablet (0.25 mg total) by mouth 2 (two) times daily as needed for anxiety.  60 tablet  0  .  aspirin EC 81 MG tablet Take 81 mg by mouth every morning.      . benzonatate (TESSALON PERLES) 100 MG capsule Take 1 capsule (100 mg total) by mouth 3 (three) times daily as needed for cough.  30 capsule  0  . lisinopril-hydrochlorothiazide (PRINZIDE,ZESTORETIC) 20-25 MG per tablet Take 1 tablet by mouth every morning.       Marland Kitchen oxyCODONE-acetaminophen (PERCOCET/ROXICET) 5-325 MG per tablet Take 1 tablet by mouth every 6 (six) hours as needed for severe pain.  60 tablet  0  . pravastatin (PRAVACHOL) 40 MG tablet Take 40 mg by mouth at bedtime.       Marland Kitchen tiotropium (SPIRIVA) 18 MCG inhalation capsule Place 18 mcg into inhaler and inhale daily.       No current facility-administered medications for this visit.    Physical Exam BP 158/98  Pulse 84  Resp 21  Ht 6\' 1"  (1.854 m)  Wt 255 lb (115.667 kg)  BMI 33.65 kg/m2  SpO2 98%  Diagnostic Tests: CT chest 03/25/2014 TECHNIQUE:  Multidetector CT imaging of the chest was performed during  intravenous contrast administration.  CONTRAST: 57mL OMNIPAQUE IOHEXOL 300 MG/ML SOLN  COMPARISON: 12/24/2013.  FINDINGS:  Postoperative change and volume loss in the left upper lobe is  noted. No pulmonary nodule or mass identified within the left  lung.  Multifocal ground-glass attenuating nodules are again identified  within the right lung. Index nodule in the superior segment of the  right lower lobe measures 10 mm, image 27/series 5 this is unchanged  from previous exam. Solid nodule closer to the right lung base is  unchanged measuring 5 mm, image 44/series 5. Right upper lobe  ground-glass attenuating nodule is unchanged measuring 1.3 cm, image  24/ series 5. In the right apex there is a stable ground-glass  nodule measuring 1 cm, image 10/series 5. No new or enlarging  pulmonary nodules identified.  The heart size appears normal. There is no pericardial effusion. No  enlarged mediastinal or hilar adenopathy.  Incidental imaging through the upper  abdomen is on unremarkable.  There is no acute finding identified.  Review of the visualized bony structures is negative for aggressive  lytic or sclerotic bone lesion.  IMPRESSION:  1. Postsurgical and therapeutic changes involving the left lung.  There are no specific features identified to suggest local Lung  cancer recurrence or metastatic disease.  2. Stable ground-glass attenuating nodules within the right lung.  Electronically Signed  By: Kerby Moors M.D.  On: 03/25/2014 14:05  Impression: 58 year old man with 2 prior lung cancers. He had a stage IIb adenocarcinoma resected in June of 2014. He received postoperative adjuvant chemotherapy. He is now one year out with no evidence of recurrent disease.  He does have issues with COPD. Fortunately he did stop smoking after his second operation. He is being treated by Dr. Lamonte Sakai for that issue.  He has a followup appointment scheduled with Dr. Julien Nordmann later this summer and will have another CT at that time.  Plan: Since Dr. Julien Nordmann is following him closely I will plan to see him back in one year to check on his progress to

## 2014-04-26 NOTE — Patient Instructions (Signed)
Please continue Spiriva daily Use albuterol if needed for shortness of breath Don't forget to get your Flu Shot this Fall Follow with Dr Lamonte Sakai in 4 months or sooner if you have any problems.

## 2014-05-23 ENCOUNTER — Other Ambulatory Visit: Payer: Self-pay | Admitting: Internal Medicine

## 2014-05-23 DIAGNOSIS — C3490 Malignant neoplasm of unspecified part of unspecified bronchus or lung: Secondary | ICD-10-CM

## 2014-05-23 DIAGNOSIS — C3491 Malignant neoplasm of unspecified part of right bronchus or lung: Secondary | ICD-10-CM

## 2014-05-23 MED ORDER — OXYCODONE-ACETAMINOPHEN 5-325 MG PO TABS: 1.0000 | ORAL_TABLET | Freq: Four times a day (QID) | ORAL | Status: DC | PRN

## 2014-05-23 MED ORDER — ALPRAZOLAM 0.25 MG PO TABS: 0.2500 mg | ORAL_TABLET | Freq: Two times a day (BID) | ORAL | Status: DC | PRN

## 2014-05-30 ENCOUNTER — Telehealth: Payer: Self-pay | Admitting: Emergency Medicine

## 2014-05-30 MED ORDER — TIOTROPIUM BROMIDE MONOHYDRATE 18 MCG IN CAPS
18.0000 ug | ORAL_CAPSULE | Freq: Every day | RESPIRATORY_TRACT | Status: DC
Start: 2014-05-30 — End: 2014-06-06

## 2014-05-30 NOTE — Telephone Encounter (Signed)
Rx was refilled  LM informing the pt  Nothing further needed

## 2014-06-02 ENCOUNTER — Telehealth: Payer: Self-pay | Admitting: Internal Medicine

## 2014-06-02 NOTE — Telephone Encounter (Signed)
returned pt call and lvm with son to call back to r/s appt

## 2014-06-03 ENCOUNTER — Telehealth: Payer: Self-pay | Admitting: Internal Medicine

## 2014-06-03 NOTE — Telephone Encounter (Signed)
misake

## 2014-06-03 NOTE — Telephone Encounter (Signed)
pt called to r/s appt due to going out of town...done and transferred pt to cs

## 2014-06-06 ENCOUNTER — Other Ambulatory Visit: Payer: Self-pay | Admitting: *Deleted

## 2014-06-06 ENCOUNTER — Telehealth: Payer: Self-pay | Admitting: Emergency Medicine

## 2014-06-06 MED ORDER — TIOTROPIUM BROMIDE MONOHYDRATE 18 MCG IN CAPS: 18.0000 ug | ORAL_CAPSULE | Freq: Every day | RESPIRATORY_TRACT | Status: DC

## 2014-06-06 NOTE — Telephone Encounter (Signed)
Spoke with pt and gave 2 samples of spirvia to last until he can get new RX. Nothing further needed

## 2014-06-16 ENCOUNTER — Telehealth: Payer: Self-pay | Admitting: *Deleted

## 2014-06-16 NOTE — Telephone Encounter (Signed)
Pt called stating that he had lab work done at [facility] with Dr Heide Guile and they are going to be faxing over the labs for Dr Vista Mink to use prior to his CT scan on 06/23/14.  Called and spoke to [place].  They will be faxing over the lab work.  Labs faxed to [facility] at radiology.  Copy left for Dr Vista Mink to review at next office visit 07/05/14.  SLJ

## 2014-06-23 ENCOUNTER — Other Ambulatory Visit: Payer: Self-pay | Admitting: *Deleted

## 2014-06-23 ENCOUNTER — Encounter (HOSPITAL_COMMUNITY): Payer: Self-pay

## 2014-06-23 ENCOUNTER — Ambulatory Visit (HOSPITAL_COMMUNITY)
Admission: RE | Admit: 2014-06-23 | Discharge: 2014-06-23 | Disposition: A | Discharge status: Home / Self Care | Payer: BC Managed Care – PPO | Source: Ambulatory Visit | Attending: Internal Medicine | Admitting: Internal Medicine

## 2014-06-23 ENCOUNTER — Other Ambulatory Visit: Payer: BC Managed Care – PPO

## 2014-06-23 DIAGNOSIS — C3491 Malignant neoplasm of unspecified part of right bronchus or lung: Secondary | ICD-10-CM

## 2014-06-23 DIAGNOSIS — C3490 Malignant neoplasm of unspecified part of unspecified bronchus or lung: Secondary | ICD-10-CM | POA: Insufficient documentation

## 2014-06-23 MED ORDER — OXYCODONE-ACETAMINOPHEN 5-325 MG PO TABS: 1.0000 | ORAL_TABLET | Freq: Four times a day (QID) | ORAL | Status: DC | PRN

## 2014-06-23 NOTE — Telephone Encounter (Signed)
Walked over after today's CT scan requesting percocet.  Last filled on 05-23-2014 for qty 60.  Verbal order received from Dr. Julien Nordmann to refill.

## 2014-06-28 ENCOUNTER — Ambulatory Visit (HOSPITAL_COMMUNITY): Payer: BC Managed Care – PPO

## 2014-06-28 ENCOUNTER — Other Ambulatory Visit: Payer: BC Managed Care – PPO

## 2014-07-05 ENCOUNTER — Encounter: Payer: Self-pay | Admitting: Internal Medicine

## 2014-07-05 ENCOUNTER — Ambulatory Visit (HOSPITAL_BASED_OUTPATIENT_CLINIC_OR_DEPARTMENT_OTHER): Payer: BC Managed Care – PPO | Admitting: Internal Medicine

## 2014-07-05 ENCOUNTER — Telehealth: Payer: Self-pay | Admitting: Internal Medicine

## 2014-07-05 VITALS — BP 147/82 | HR 87 | Temp 97.5°F | Resp 19 | Ht 73.0 in | Wt 256.5 lb

## 2014-07-05 DIAGNOSIS — C3430 Malignant neoplasm of lower lobe, unspecified bronchus or lung: Secondary | ICD-10-CM

## 2014-07-05 DIAGNOSIS — C3491 Malignant neoplasm of unspecified part of right bronchus or lung: Secondary | ICD-10-CM

## 2014-07-05 DIAGNOSIS — C3490 Malignant neoplasm of unspecified part of unspecified bronchus or lung: Secondary | ICD-10-CM

## 2014-07-05 DIAGNOSIS — R0789 Other chest pain: Secondary | ICD-10-CM

## 2014-07-05 MED ORDER — OXYCODONE-ACETAMINOPHEN 5-325 MG PO TABS: 1.0000 | ORAL_TABLET | Freq: Four times a day (QID) | ORAL | Status: DC | PRN

## 2014-07-05 NOTE — Progress Notes (Signed)
[place] [phone]   [phone]  OFFICE PROGRESS NOTE  [name] Sake, MD Dr. Cristela Blue [name] [address] 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  2) stage IIB (T3, N1, M0) non-small cell lung cancer, adenocarcinoma diagnosed in June of 2014.   PRIOR THERAPY:  1) Status post 3 cycles of systemic chemotherapy with carboplatin and etoposide. Last dose was given on 07/16/2011.  2) Left video-assisted thoracoscopy, mini thoracotomy; wedge, posterior segment of left upper lobe; wedge, anterior segment of left lower lobe with node dissection.  3) Redo left video-assisted thoracoscopy, lysis of adhesions, wedge resection of left lower lobe, thoracoscopic left lower lobectomy under the care of Dr. Roxan Hockey on 05/03/2013.  4) Adjuvant chemotherapy with cisplatin 75 mg/M2 and Alimta 500 mg/M2 every 3 weeks, first dose given 06/28/2013. Status post 4 cycles.  CURRENT THERAPY: Observation.  CHEMOTHERAPY INTENT: Adjuvant/Curative.  CURRENT # OF CHEMOTHERAPY CYCLES: 0 CURRENT ANTIEMETICS: N/A  CURRENT SMOKING STATUS: Quit smoking 05/02/13  ORAL CHEMOTHERAPY AND CONSENT: None  CURRENT BISPHOSPHONATES USE: None  PAIN MANAGEMENT: 2/10  NARCOTICS INDUCED CONSTIPATION: None  LIVING WILL AND CODE STATUS: Full Code  INTERVAL HISTORY: [name] [name] 58 y.o. male returns to the clinic today for followup visit. The patient is feeling fine today with no specific complaints except for shortness of breath with exertion. and left-sided chest pain secondary to his previous surgery. He was seen recently by Dr. Lamonte Sakai for treatment of COPD and he is currently on Spiriva and addition to albuterol inhaler and nebulizer. The patient denied having any fever or chills. He denied having any cough or hemoptysis. He requested a refill of Percocet. He  has no weight loss or night sweats. He has repeat CT scan of the chest performed recently and he is here for evaluation and discussion of his scan results.  MEDICAL HISTORY: Past Medical History  Diagnosis Date  . Hypercholesteremia     takes Pravastatin nightly  . HTN (hypertension)     takes Lisinopril and HCTZ daily  . Shortness of breath     with exertion;Albuterol inhaler prn  . Insomnia     takes Xanax prn  . Lung cancer     ALLERGIES:  has No Known Allergies.  MEDICATIONS:  Current Outpatient Prescriptions  Medication Sig Dispense Refill  . albuterol (PROVENTIL HFA;VENTOLIN HFA) 108 (90 BASE) MCG/ACT inhaler Inhale 1-2 puffs into the lungs every 4 (four) hours as needed for wheezing.      Marland Kitchen albuterol (PROVENTIL) (2.5 MG/3ML) 0.083% nebulizer solution Take 3 mLs (2.5 mg total) by nebulization every 4 (four) hours as needed for wheezing or shortness of breath.  75 mL  12  . ALPRAZolam (XANAX) 0.25 MG tablet Take 1 tablet (0.25 mg total) by mouth 2 (two) times daily as needed for anxiety.  60 tablet  0  . aspirin EC 81 MG tablet Take 81 mg by mouth every morning.      . benzonatate (TESSALON PERLES) 100 MG capsule Take 1 capsule (100 mg total) by mouth 3 (three) times daily as needed for cough.  30 capsule  0  . lisinopril-hydrochlorothiazide (PRINZIDE,ZESTORETIC) 20-25 MG per tablet Take 1 tablet by mouth every morning.       Marland Kitchen oxyCODONE-acetaminophen (PERCOCET/ROXICET) 5-325 MG per tablet Take 1 tablet by mouth every 6 (six) hours as needed for severe  pain.  60 tablet  0  . pravastatin (PRAVACHOL) 40 MG tablet Take 40 mg by mouth at bedtime.       Marland Kitchen tiotropium (SPIRIVA) 18 MCG inhalation capsule Place 1 capsule (18 mcg total) into inhaler and inhale daily.  30 capsule  5   No current facility-administered medications for this visit.    SURGICAL HISTORY:  Past Surgical History  Procedure Laterality Date  . Left video-assisted thoracoscopy, mini thoracotomy; wedge, posterior  segment of left upper lobe; wedge, anterior segment of left lower lobe with node dissection.  09/02/11    [facility]  . Video assisted thoracoscopy Left 05/03/2013    Procedure: REDO VIDEO ASSISTED THORACOSCOPY;  Surgeon: Melrose Nakayama, MD;  Location: [facility];  Service: Thoracic;  Laterality: Left;  REDO LEFT VATS  . Thoracotomy Left 05/03/2013    Procedure: THORACOTOMY MAJOR;  Surgeon: Melrose Nakayama, MD;  Location: [facility];  Service: Thoracic;  Laterality: Left;  . Lobectomy Left 05/03/2013    Procedure: LOBECTOMY;  Surgeon: Melrose Nakayama, MD;  Location: [facility];  Service: Thoracic;  Laterality: Left;  . Wedge resection Left 05/03/2013    Procedure: WEDGE RESECTION;  Surgeon: Melrose Nakayama, MD;  Location: [facility];  Service: Thoracic;  Laterality: Left;  . Open reduction internal fixation (orif) tibia/fibula fracture Right 07/01/2013    Procedure: OPEN REDUCTION INTERNAL FIXATION TIBIAL  FRACTURE;  Surgeon: Johnn Hai, MD;  Location: [facility];  Service: Orthopedics;  Laterality: Right;    REVIEW OF SYSTEMS:  Constitutional: negative Eyes: negative Ears, nose, mouth, throat, and face: negative Respiratory: positive for dyspnea on exertion Cardiovascular: negative Gastrointestinal: negative Genitourinary:negative Integument/breast: negative Hematologic/lymphatic: negative Musculoskeletal:negative Neurological: negative Behavioral/Psych: negative Endocrine: negative Allergic/Immunologic: negative   PHYSICAL EXAMINATION: General appearance: alert, cooperative, fatigued and no distress Head: Normocephalic, without obvious abnormality, atraumatic Neck: no adenopathy, no JVD, supple, symmetrical, trachea midline and thyroid not enlarged, symmetric, no tenderness/mass/nodules Lymph nodes: Cervical, supraclavicular, and axillary nodes normal. Resp: clear to auscultation bilaterally Back: symmetric, no curvature. ROM normal. No CVA tenderness. Cardio: regular rate and rhythm,  S1, S2 normal, no murmur, click, rub or gallop GI: soft, non-tender; bowel sounds normal; no masses,  no organomegaly Extremities: extremities normal, atraumatic, no cyanosis or edema Neurologic: Alert and oriented X 3, normal strength and tone. Normal symmetric reflexes. Normal coordination and gait  ECOG PERFORMANCE STATUS: 1 - Symptomatic but completely ambulatory  Blood pressure 147/82, pulse 87, temperature 97.5 F (36.4 C), temperature source Oral, resp. rate 19, height 6\' 1"  (1.854 m), weight 256 lb 8 oz (116.348 kg).  LABORATORY DATA: Lab Results  Component Value Date   WBC 10.0 03/25/2014   HGB 12.0* 03/25/2014   HCT 36.9* 03/25/2014   MCV 83.8 03/25/2014   PLT 288 03/25/2014      Chemistry      Component Value Date/Time   NA 140 03/25/2014 0835   NA 139 09/21/2013 1438   NA 138 03/25/2012 1225   K 5.1 03/25/2014 0835   K 3.8 09/21/2013 1438   K 4.2 03/25/2012 1225   CL 102 09/21/2013 1438   CL 105 03/03/2013 1002   CL 98 03/25/2012 1225   CO2 21* 03/25/2014 0835   CO2 32 07/04/2013 0455   CO2 26 03/25/2012 1225   BUN 27.6* 03/25/2014 0835   BUN 15 09/21/2013 1438   BUN 11 03/25/2012 1225   CREATININE 1.8* 03/25/2014 0835   CREATININE 1.00 09/21/2013 1438   CREATININE 1.0 03/25/2012 1225  Component Value Date/Time   CALCIUM 9.4 03/25/2014 0835   CALCIUM 9.1 07/04/2013 0455   CALCIUM 8.6 03/25/2012 1225   ALKPHOS 90 03/25/2014 0835   ALKPHOS 51 07/02/2013 0455   ALKPHOS 84 03/25/2012 1225   AST 16 03/25/2014 0835   AST 23 07/02/2013 0455   AST 33 03/25/2012 1225   ALT 24 03/25/2014 0835   ALT 34 07/02/2013 0455   ALT 43 03/25/2012 1225   BILITOT 0.27 03/25/2014 0835   BILITOT 0.5 07/02/2013 0455   BILITOT 0.80 03/25/2012 1225       RADIOGRAPHIC STUDIES: Ct Chest Wo Contrast  06/23/2014   CLINICAL DATA:  Lung cancer diagnosed in 2012. Chemotherapy complete. Chronic shortness of breath. Left-sided upper lobe wedge resection in July 2012.  EXAM: CT CHEST WITHOUT CONTRAST   TECHNIQUE: Multidetector CT imaging of the chest was performed following the standard protocol without IV contrast.  COMPARISON:  03/25/2014 and 12/24/2013  FINDINGS: Lungs/Pleura: Postsurgical changes about the left upper lobe. No evidence of locally recurrent disease.  Right apical ground-glass nodule is unchanged at 1.0 cm on image 11.  Multiple other ground-glass pulmonary nodules are also identified. Example at 1.3 cm on image 25 in the inferior right upper lobe.  5 mm right lower lobe lung nodule on image 45 is not significantly changed  No pleural fluid.  Heart/Mediastinum: Normal heart size, without pericardial effusion. Multivessel coronary artery atherosclerosis. No mediastinal or definite hilar adenopathy, given limitations of unenhanced CT.  Upper Abdomen: Normal imaged portions of the liver, spleen, stomach, pancreas, gallbladder, adrenal glands, kidneys.  Bones/Musculoskeletal: Postsurgical changes of left-sided ribs. Healing fracture of the posterior lateral right sixth rib on image 27. This is new since 12/24/2013, without underlying suspicious lesion on that exam.  IMPRESSION: 1. Postsurgical changes of left upper lobe wedge resection, without locally recurrent or metastatic disease. 2. Similar appearance of right-sided ground-glass nodules. These are indeterminate and warrant followup attention. 3. Healing right sixth rib fracture, new since 12/24/2013. Correlate with interval trauma. No underlying osseous lesion identified on the study from February.   Electronically Signed   By: Abigail Miyamoto M.D.   On: 06/23/2014 14:07   ASSESSMENT AND PLAN: This is a very pleasant 58 years old white male with   1) recently diagnosed as stage IIB non-small cell lung cancer, adenocarcinoma status post resection and completed 4 cycles of adjuvant chemotherapy with cisplatin and Alimta.  He tolerated the last cycle of his treatment fairly well.  His recent CT scan of the chest showed no significant evidence  for disease progression. I discussed the scan results with the patient. I recommended for him to continue on observation with repeat CT scan of the chest in 4 months.  2) COPD: Followed by Dr. Lamonte Sakai and he is currently on Spiriva and albuterol inhaler and nebulizers.  3) hypertension: Continue lisinopril/HCTZ.  4) pain management: He was given a refill of Percocet and the patient was advised to contact primary care physician for further refills.  He was advised to call immediately if he has any concerning symptoms in the interval. The patient voices understanding of current disease status and treatment options and is in agreement with the current care plan.  All questions were answered. The patient knows to call the clinic with any problems, questions or concerns. We can certainly see the patient much sooner if necessary.  Disclaimer: This note was dictated with voice recognition software. Similar sounding words can inadvertently be transcribed and may not be corrected  upon review.

## 2014-07-05 NOTE — Telephone Encounter (Signed)
gv pt appt schedule for dec. central will call re ct.

## 2014-07-11 ENCOUNTER — Encounter: Payer: Self-pay | Admitting: Internal Medicine

## 2014-07-11 ENCOUNTER — Encounter: Payer: Self-pay | Admitting: Emergency Medicine

## 2014-07-11 NOTE — Telephone Encounter (Signed)
RB please advise if and when you would like patient to follow up with you as he has recently been treated for PNA. Thanks.

## 2014-07-19 ENCOUNTER — Other Ambulatory Visit: Payer: Self-pay | Admitting: Internal Medicine

## 2014-07-20 ENCOUNTER — Other Ambulatory Visit: Payer: Self-pay | Admitting: *Deleted

## 2014-07-20 ENCOUNTER — Other Ambulatory Visit: Payer: Self-pay | Admitting: Medical Oncology

## 2014-07-20 DIAGNOSIS — C3490 Malignant neoplasm of unspecified part of unspecified bronchus or lung: Secondary | ICD-10-CM

## 2014-07-20 DIAGNOSIS — C3491 Malignant neoplasm of unspecified part of right bronchus or lung: Secondary | ICD-10-CM

## 2014-07-20 NOTE — Telephone Encounter (Signed)
I told pt to call PCP for percocet and xanax refill. He is being treated for Pneumonia and PCP following up.

## 2014-08-17 IMAGING — CT CT CHEST W/ CM
2 of 4 series · 15 of 36 positions shown, 18 images · IV contrast (OMNIPAQUE)
Comparison: CT CHEST W/CM dated 09/21/2013

CLINICAL DATA: Lung cancer restaging.  Chemotherapy completed.

EXAM:
CT CHEST WITH CONTRAST
TECHNIQUE: Multidetector CT imaging of the chest was performed during
intravenous contrast administration.
CONTRAST:  80mL OMNIPAQUE IOHEXOL 300 MG/ML  SOLN

[Series 2: chest with st · axial · 0.86mm/px · z∈[-326,-26]mm · 12 of 71 slices shown, 15 images]
[im 6/71  mediastinal]
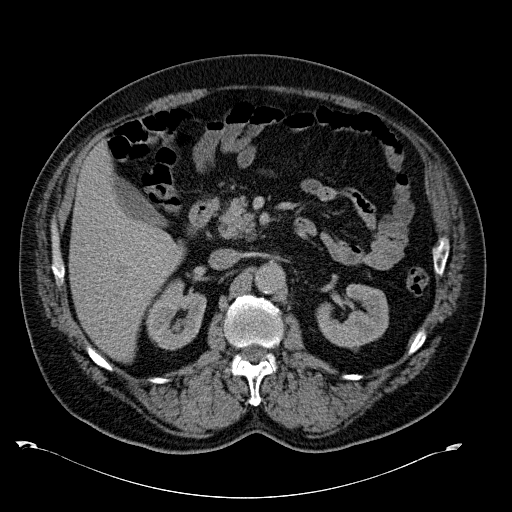
[im 6/71  lung]
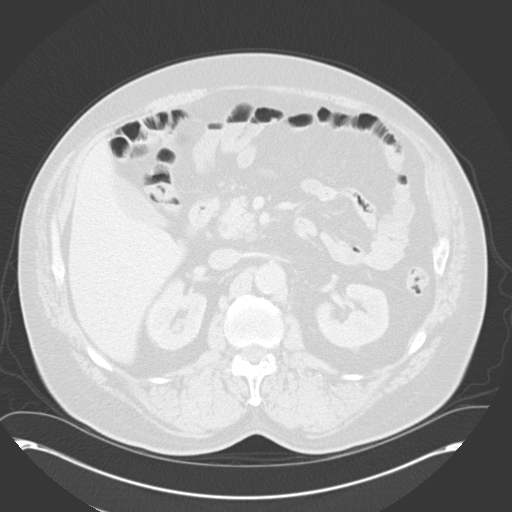
[im 11/71  lung]
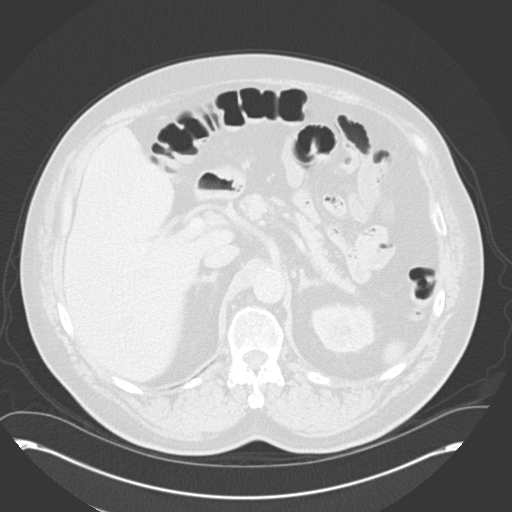
[im 16/71  lung]
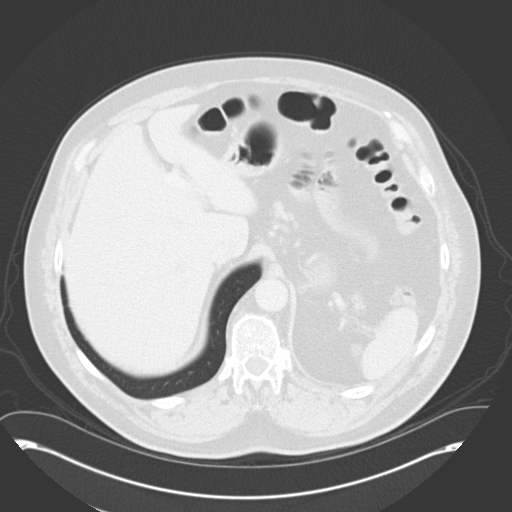
[im 21/71  lung]
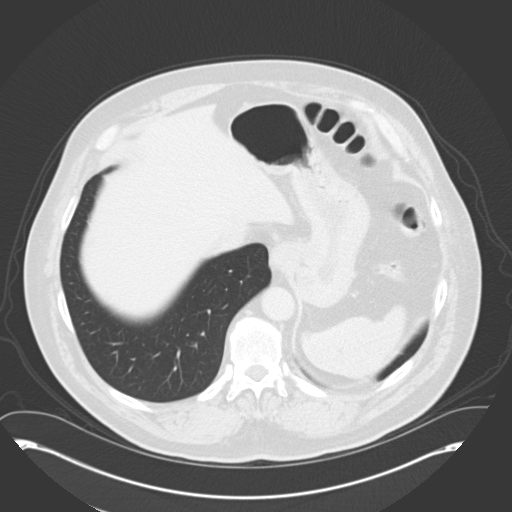
[im 26/71  mediastinal]
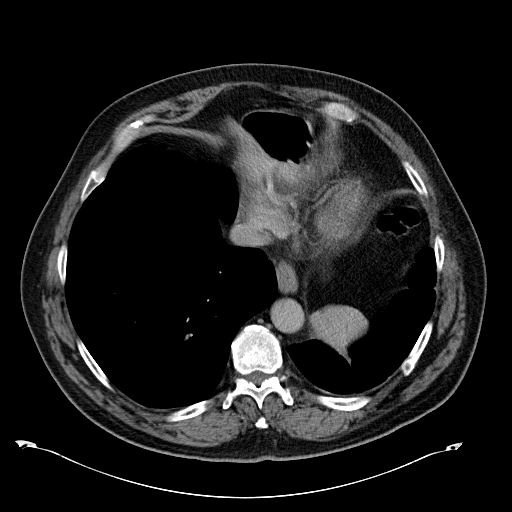
[im 26/71  lung]
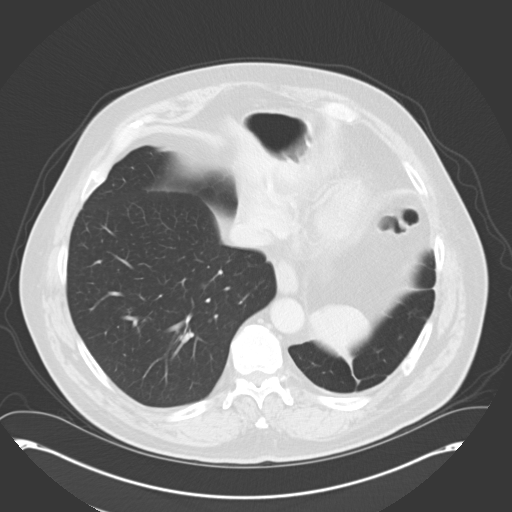
[im 31/71  lung]
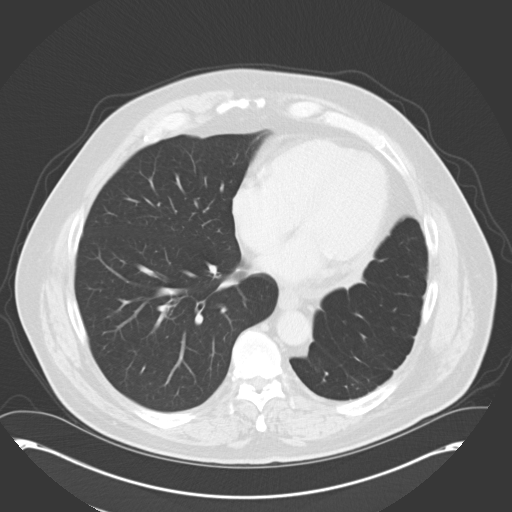
[im 41/71  lung]
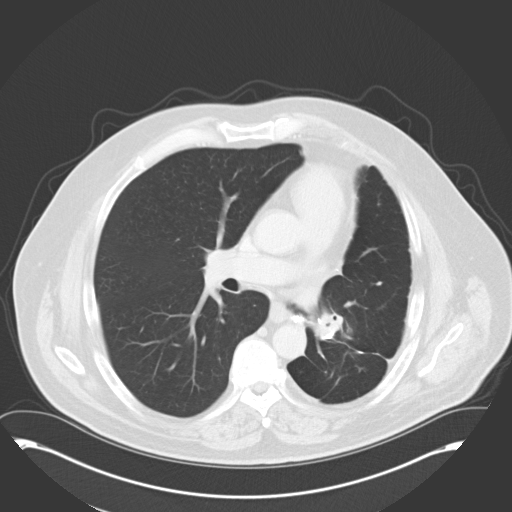
[im 46/71  lung]
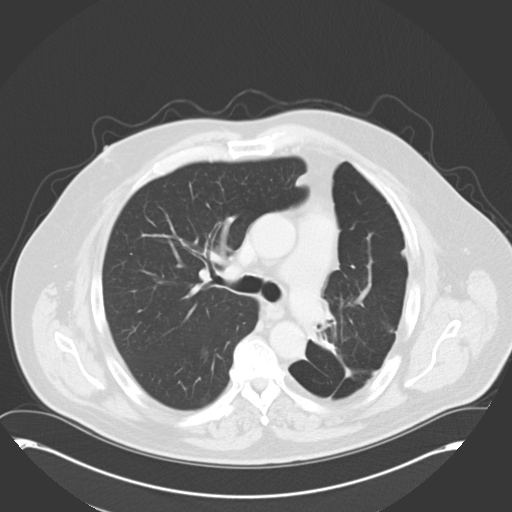
[im 51/71  mediastinal]
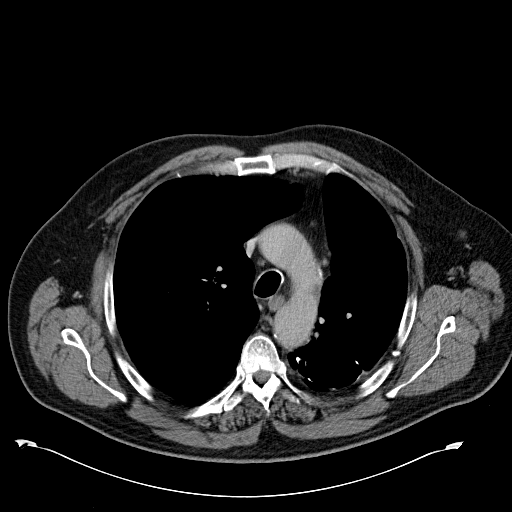
[im 51/71  lung]
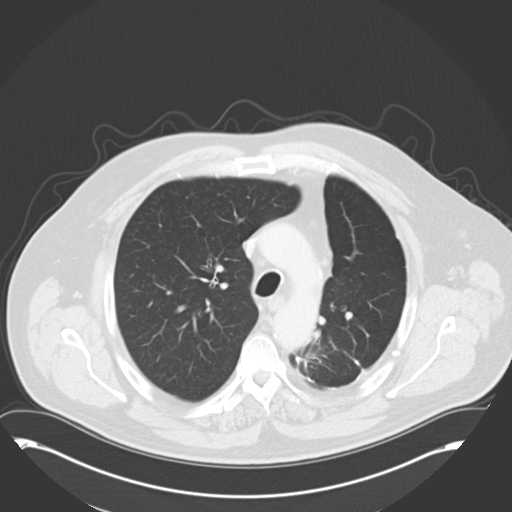
[im 56/71  lung]
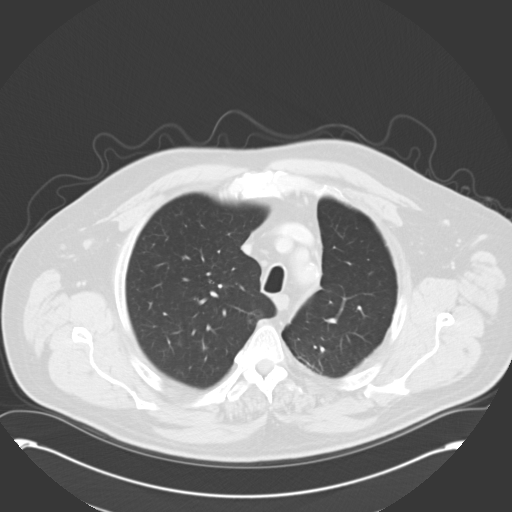
[im 61/71  lung]
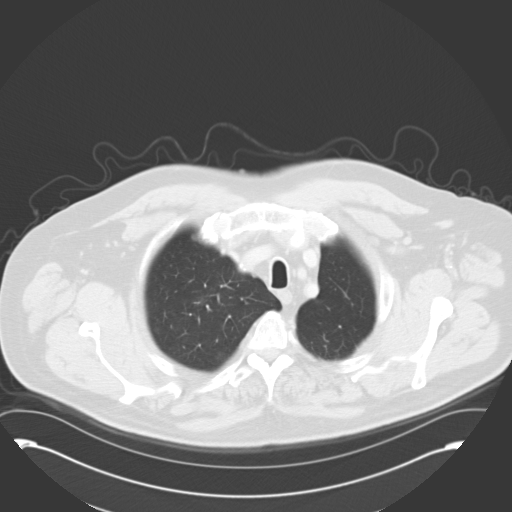
[im 66/71  lung]
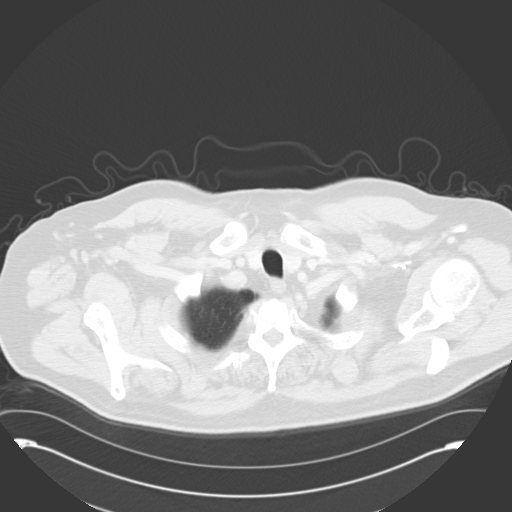

[Series 602: cor · coronal · 0.86mm/px · 3 of 103 slices shown]
[im 21/103  lung]
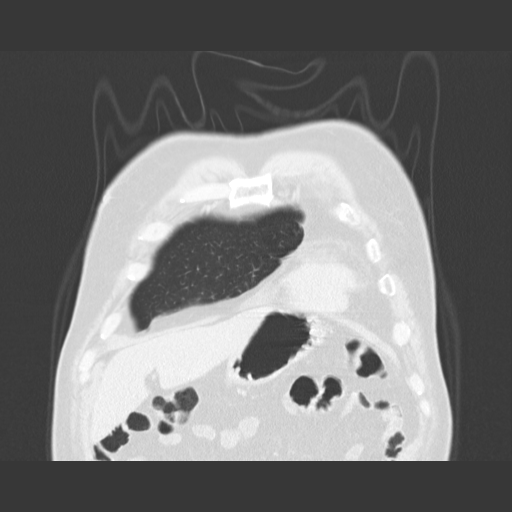
[im 41/103  lung]
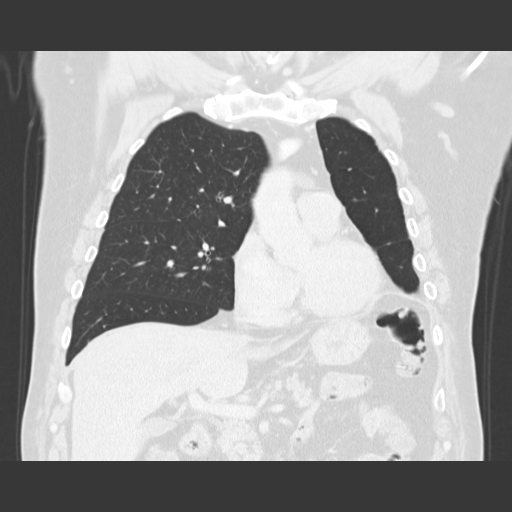
[im 62/103  lung]
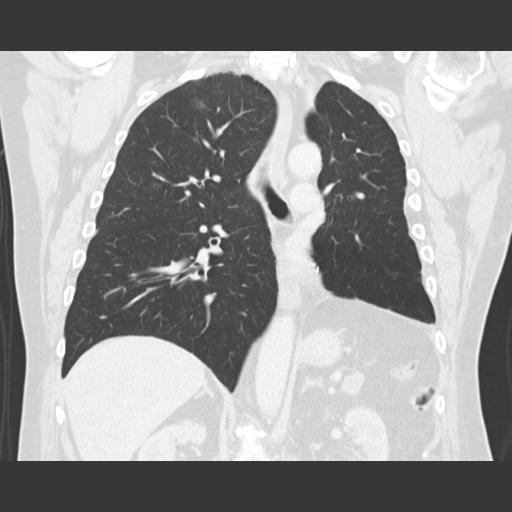

[15 of 36 positions shown; findings below may reference images not displayed]

FINDINGS: Postsurgical change in the left upper lobe with volume loss and
thickening along the surgical line. No evidence of nodularity.

In the right lung there is hyperexpansion. There is a ground-glass
nodule in the right lower lobe measuring 8 mm unchanged from prior
(image 27, series 5). Smudgy nodule in the right middle lobe
measures 4 mm also unchanged. Two ill-defined ground-glass nodules
in the right upper lobe also unchanged.

No axillary or supraclavicular lymphadenopathy. No mediastinal or
hilar lymphadenopathy. No pericardial fluid.

Limited view of the upper abdomen demonstrates normal adrenal
glands. Limited view of the skeleton is unremarkable.
IMPRESSION: 1. Post  surgical and therapy change in the left upper lobe.
2. No evidence of local lung cancer recurrence or metastasis in the
thorax.
3. Stable ground-glass nodules in the right lung.

## 2014-09-14 ENCOUNTER — Ambulatory Visit (INDEPENDENT_AMBULATORY_CARE_PROVIDER_SITE_OTHER): Payer: BC Managed Care – PPO | Admitting: Emergency Medicine

## 2014-09-14 ENCOUNTER — Encounter: Payer: Self-pay | Admitting: Emergency Medicine

## 2014-09-14 VITALS — BP 130/78 | HR 89 | Temp 98.1°F | Ht 73.0 in | Wt 265.0 lb

## 2014-09-14 DIAGNOSIS — J449 Chronic obstructive pulmonary disease, unspecified: Secondary | ICD-10-CM

## 2014-09-14 NOTE — Progress Notes (Signed)
Subjective:    Patient ID: [name] [name], male    DOB: 04/18/1956, 58 y.o.   MRN: [id number]  HPI 58 yo man, former smoker (17 pk-yrs), hx poorly differentiated lung CA stage IB in 7/'12, then stage IIB adenoCA in 6/14. Underwent chemo, wedge resection LUL and LLL and then LLL lobectomy in 6/14. His last CT scan was 03/25/14 and was reassuring. He is referred by Dr Julien Nordmann for COPD and dyspnea.    He has noticed that his exertional tolerance has decreased, especially after his surgery in June '14. He has wheeze with exertion and also at rest. He has some cough but not a lot. Also with residual neuropathic L sided pain.   PFT 04/20/13  (pre-op)> severe AFL with positive BD response, normal volumes, normal DLCO.   ROV 04/26/14 -- follow up for lung CA with rx as above, severe AFL and COPD. Last time we started Spiriva to see if he would benefit. He tells me that the spiriva has helped with his exertional SOB and his wheeze. His congestion has improved. He uses albuterol via neb a few times times a week. He is due for CT chest in August '15.   ROV 09/14/14 -- follow up visit for severe COPD. He has a hx lung CA s/p chemo, LUL wedge and then LLL lobectomy.  He had CT scan 06/23/14 that was stable. He was treated for PNA in August, was not admitted. He is on Spiriva qd, uses albuterol prn but does not take a full dose, just a fraction of a full vial. He has wheeze that happens at random, sometimes better with cough or throat clearing. He has not restarted cigarettes, has been off for > 1 yr. He is due to see Dr Julien Nordmann in December.   Review of Systems  Constitutional: Negative for fever and unexpected weight change.  HENT: Positive for congestion. Negative for dental problem, ear pain, nosebleeds, postnasal drip, rhinorrhea, sinus pressure, sneezing, sore throat and trouble swallowing.   Eyes: Negative for redness and itching.  Respiratory: Positive for shortness of breath. Negative for cough, chest  tightness and wheezing.   Cardiovascular: Positive for chest pain and leg swelling. Negative for palpitations.  Gastrointestinal: Negative for nausea and vomiting.  Genitourinary: Negative for dysuria.  Musculoskeletal: Negative for joint swelling.  Skin: Negative for rash.  Neurological: Negative for headaches.  Hematological: Does not bruise/bleed easily.  Psychiatric/Behavioral: Negative for dysphoric mood. The patient is not nervous/anxious.        Objective:   Physical Exam Filed Vitals:   09/14/14 1140  BP: 130/78  Pulse: 89  Temp: 98.1 F (36.7 C)  TempSrc: Oral  Height: 6\' 1"  (1.854 m)  Weight: 265 lb (120.203 kg)  SpO2: 99%   Gen: Pleasant, well-nourished, in no distress,  normal affect  ENT: No lesions,  mouth clear,  oropharynx clear, no postnasal drip  Neck: No JVD, no TMG, no carotid bruits  Lungs: No use of accessory muscles, very decreased on the L, clear without rales or rhonchi  Cardiovascular: RRR, heart sounds normal, no murmur or gallops, no peripheral edema  Musculoskeletal: symmetric edema on the R LE post-leg fracture, no cyanosis or clubbing  Neuro: alert, non focal  Skin: Warm, no lesions or rashes    06/23/14 --  COMPARISON: 03/25/2014 and 12/24/2013  FINDINGS: Lungs/Pleura: Postsurgical changes about the left upper lobe. No evidence of locally recurrent disease.  Right apical ground-glass nodule is unchanged at 1.0 cm on image 11.  Multiple other ground-glass pulmonary nodules are also identified. Example at 1.3 cm on image 25 in the inferior right upper lobe.  5 mm right lower lobe lung nodule on image 45 is not significantly changed  No pleural fluid.  Heart/Mediastinum: Normal heart size, without pericardial effusion. Multivessel coronary artery atherosclerosis. No mediastinal or definite hilar adenopathy, given limitations of unenhanced CT.  Upper Abdomen: Normal imaged portions of the liver, spleen,  stomach, pancreas, gallbladder, adrenal glands, kidneys.  Bones/Musculoskeletal: Postsurgical changes of left-sided ribs. Healing fracture of the posterior lateral right sixth rib on image 27. This is new since 12/24/2013, without underlying suspicious lesion on that exam.  IMPRESSION: 1. Postsurgical changes of left upper lobe wedge resection, without locally recurrent or metastatic disease. 2. Similar appearance of right-sided ground-glass nodules. These are indeterminate and warrant followup attention. 3. Healing right sixth rib fracture, new since 12/24/2013. Correlate with interval trauma. No underlying osseous lesion identified on the study from February.     Assessment & Plan:  COPD (chronic obstructive pulmonary disease) Continues to have sx, especially cough and exertional SOB. He is using small amt's of albuterol through the day via nebulizer. Will try to change to stiolto to see if he gets more benefit.   We will try changing your Spiriva to Stiolto. Take this to see if it gives you more benefit. If so, call our office and we can call in a prescription for you Continue to have your albuterol available to use as needed Follow with Dr Julien Nordmann as planned next month.  Follow with Dr Lamonte Sakai in 3 months or sooner if you have any problems.

## 2014-09-14 NOTE — Assessment & Plan Note (Signed)
Continues to have sx, especially cough and exertional SOB. He is using small amt's of albuterol through the day via nebulizer. Will try to change to stiolto to see if he gets more benefit.   We will try changing your Spiriva to Stiolto. Take this to see if it gives you more benefit. If so, call our office and we can call in a prescription for you Continue to have your albuterol available to use as needed Follow with Dr Julien Nordmann as planned next month.  Follow with Dr Lamonte Sakai in 3 months or sooner if you have any problems.

## 2014-09-14 NOTE — Patient Instructions (Signed)
We will try changing your Spiriva to Stiolto. Take this to see if it gives you more benefit. If so, call our office and we can call in a prescription for you Continue to have your albuterol available to use as needed Follow with Dr Julien Nordmann as planned next month.  Follow with Dr Lamonte Sakai in 3 months or sooner if you have any problems.

## 2014-09-26 ENCOUNTER — Encounter: Payer: Self-pay | Admitting: Internal Medicine

## 2014-09-27 ENCOUNTER — Encounter: Payer: Self-pay | Admitting: Internal Medicine

## 2014-09-28 ENCOUNTER — Telehealth: Payer: Self-pay | Admitting: Medical Oncology

## 2014-09-28 ENCOUNTER — Encounter: Payer: BC Managed Care – PPO | Admitting: Nurse Practitioner

## 2014-09-28 NOTE — Telephone Encounter (Signed)
I left another message for pt to come in today at 3 pm for office visit.

## 2014-09-28 NOTE — Telephone Encounter (Signed)
Pt stated his shoulder pain is getting worse . He is  Not taking any anti-inflammatory . He tried using 10 lb weight to see if "it would take the pain away, " I told him to see his PCP since he is only under observation with Dr Julien Nordmann. He said he will f/u with PCP.

## 2014-09-29 ENCOUNTER — Encounter: Payer: BC Managed Care – PPO | Admitting: Nurse Practitioner

## 2014-10-18 ENCOUNTER — Telehealth: Payer: Self-pay | Admitting: Emergency Medicine

## 2014-10-18 MED ORDER — TIOTROPIUM BROMIDE-OLODATEROL 2.5-2.5 MCG/ACT IN AERS: 2.0000 | INHALATION_SPRAY | Freq: Every day | RESPIRATORY_TRACT | Status: DC

## 2014-10-18 NOTE — Telephone Encounter (Signed)
Called spoke with patient who reports that the [place] has worked well for him (pt thought he needed an appt w/ RB for Feb 2016 to discuss the med change) - advised pt will be happy to send in rx for him.  Pt is aware to contact the office if insurance doesn't pay for the [place].  Nothing further needed at this time; will sign off.  Med list updated.  Per 11.5.15 ov w/ RB: Patient Instructions       We will try changing your Spiriva to Stiolto. Take this to see if it gives you more benefit. If so, call our office and we can call in a prescription for you Continue to have your albuterol available to use as needed Follow with Dr Julien Nordmann as planned next month.  Follow with Dr Lamonte Sakai in 3 months or sooner if you have any problems.

## 2014-11-02 ENCOUNTER — Ambulatory Visit (HOSPITAL_COMMUNITY)
Admission: RE | Admit: 2014-11-02 | Discharge: 2014-11-02 | Disposition: A | Discharge status: Home / Self Care | Payer: BC Managed Care – PPO | Source: Ambulatory Visit | Attending: Internal Medicine | Admitting: Internal Medicine

## 2014-11-02 ENCOUNTER — Ambulatory Visit (HOSPITAL_BASED_OUTPATIENT_CLINIC_OR_DEPARTMENT_OTHER): Payer: BC Managed Care – PPO | Admitting: Lab

## 2014-11-02 ENCOUNTER — Encounter (HOSPITAL_COMMUNITY): Payer: Self-pay

## 2014-11-02 DIAGNOSIS — Z9889 Other specified postprocedural states: Secondary | ICD-10-CM | POA: Insufficient documentation

## 2014-11-02 DIAGNOSIS — C3491 Malignant neoplasm of unspecified part of right bronchus or lung: Secondary | ICD-10-CM

## 2014-11-02 DIAGNOSIS — C3430 Malignant neoplasm of lower lobe, unspecified bronchus or lung: Secondary | ICD-10-CM

## 2014-11-02 DIAGNOSIS — Z9221 Personal history of antineoplastic chemotherapy: Secondary | ICD-10-CM | POA: Diagnosis not present

## 2014-11-02 DIAGNOSIS — C3490 Malignant neoplasm of unspecified part of unspecified bronchus or lung: Secondary | ICD-10-CM

## 2014-11-02 LAB — CBC WITH DIFFERENTIAL/PLATELET
BASO%: 0.1 % (ref 0.0–2.0)
Basophils Absolute: 0 10e3/uL (ref 0.0–0.1)
EOS%: 2.9 % (ref 0.0–7.0)
Eosinophils Absolute: 0.5 10e3/uL (ref 0.0–0.5)
HCT: 38.2 % — ABNORMAL LOW (ref 38.4–49.9)
HGB: 12.6 g/dL — ABNORMAL LOW (ref 13.0–17.1)
LYMPH%: 9.5 % — ABNORMAL LOW (ref 14.0–49.0)
MCH: 26.8 pg — ABNORMAL LOW (ref 27.2–33.4)
MCHC: 33 g/dL (ref 32.0–36.0)
MCV: 81.3 fL (ref 79.3–98.0)
MONO#: 1.1 10e3/uL — ABNORMAL HIGH (ref 0.1–0.9)
MONO%: 6.2 % (ref 0.0–14.0)
NEUT#: 14.1 10e3/uL — ABNORMAL HIGH (ref 1.5–6.5)
NEUT%: 81.3 % — ABNORMAL HIGH (ref 39.0–75.0)
Platelets: 274 10e3/uL (ref 140–400)
RBC: 4.7 10e6/uL (ref 4.20–5.82)
RDW: 14.2 % (ref 11.0–14.6)
WBC: 17.4 10e3/uL — ABNORMAL HIGH (ref 4.0–10.3)
lymph#: 1.7 10e3/uL (ref 0.9–3.3)

## 2014-11-02 LAB — COMPREHENSIVE METABOLIC PANEL (CC13)
ALBUMIN: 3.9 g/dL (ref 3.5–5.0)
ALK PHOS: 87 U/L (ref 40–150)
ALT: 27 U/L (ref 0–55)
ANION GAP: 10 meq/L (ref 3–11)
AST: 19 U/L (ref 5–34)
BILIRUBIN TOTAL: 0.39 mg/dL (ref 0.20–1.20)
BUN: 25.5 mg/dL (ref 7.0–26.0)
CALCIUM: 9.3 mg/dL (ref 8.4–10.4)
CO2: 23 meq/L (ref 22–29)
CREATININE: 1.6 mg/dL — AB (ref 0.7–1.3)
Chloride: 107 mEq/L (ref 98–109)
EGFR: 46 mL/min/{1.73_m2} — ABNORMAL LOW (ref >90)
GLUCOSE: 122 mg/dL (ref 70–140)
Potassium: 4.7 mEq/L (ref 3.5–5.1)
SODIUM: 140 meq/L (ref 136–145)
TOTAL PROTEIN: 7.2 g/dL (ref 6.4–8.3)

## 2014-11-02 MED ORDER — IOHEXOL 300 MG/ML  SOLN
100.0000 mL | Freq: Once | INTRAMUSCULAR | Status: AC | PRN
  Administered 2014-11-02: 80 mL via INTRAVENOUS

## 2014-11-09 ENCOUNTER — Encounter: Payer: Self-pay | Admitting: Internal Medicine

## 2014-11-09 ENCOUNTER — Other Ambulatory Visit: Payer: Self-pay | Admitting: Internal Medicine

## 2014-11-09 ENCOUNTER — Other Ambulatory Visit: Payer: Self-pay | Admitting: Radiation Therapy

## 2014-11-09 ENCOUNTER — Ambulatory Visit (HOSPITAL_COMMUNITY)
Admission: RE | Admit: 2014-11-09 | Discharge: 2014-11-09 | Disposition: A | Discharge status: Home / Self Care | Payer: BC Managed Care – PPO | Source: Ambulatory Visit | Attending: Internal Medicine | Admitting: Internal Medicine

## 2014-11-09 ENCOUNTER — Telehealth: Payer: Self-pay | Admitting: Internal Medicine

## 2014-11-09 ENCOUNTER — Ambulatory Visit (HOSPITAL_BASED_OUTPATIENT_CLINIC_OR_DEPARTMENT_OTHER): Payer: BC Managed Care – PPO | Admitting: Internal Medicine

## 2014-11-09 ENCOUNTER — Telehealth: Payer: Self-pay | Admitting: *Deleted

## 2014-11-09 VITALS — BP 151/80 | HR 71 | Temp 97.7°F | Resp 18 | Ht 73.0 in | Wt 261.2 lb

## 2014-11-09 DIAGNOSIS — R51 Headache: Secondary | ICD-10-CM

## 2014-11-09 DIAGNOSIS — Z85118 Personal history of other malignant neoplasm of bronchus and lung: Secondary | ICD-10-CM | POA: Diagnosis not present

## 2014-11-09 DIAGNOSIS — C7931 Secondary malignant neoplasm of brain: Secondary | ICD-10-CM

## 2014-11-09 DIAGNOSIS — R220 Localized swelling, mass and lump, head: Secondary | ICD-10-CM | POA: Insufficient documentation

## 2014-11-09 DIAGNOSIS — R112 Nausea with vomiting, unspecified: Secondary | ICD-10-CM | POA: Diagnosis present

## 2014-11-09 DIAGNOSIS — C3492 Malignant neoplasm of unspecified part of left bronchus or lung: Secondary | ICD-10-CM

## 2014-11-09 DIAGNOSIS — D72829 Elevated white blood cell count, unspecified: Secondary | ICD-10-CM

## 2014-11-09 DIAGNOSIS — G936 Cerebral edema: Secondary | ICD-10-CM | POA: Insufficient documentation

## 2014-11-09 DIAGNOSIS — C3412 Malignant neoplasm of upper lobe, left bronchus or lung: Secondary | ICD-10-CM

## 2014-11-09 MED ORDER — AZITHROMYCIN 250 MG PO TABS: ORAL_TABLET | ORAL | Status: DC

## 2014-11-09 MED ORDER — ALPRAZOLAM 0.25 MG PO TABS: 0.2500 mg | ORAL_TABLET | Freq: Three times a day (TID) | ORAL | Status: DC | PRN

## 2014-11-09 MED ORDER — IOHEXOL 300 MG/ML  SOLN
100.0000 mL | Freq: Once | INTRAMUSCULAR | Status: AC | PRN
  Administered 2014-11-09: 100 mL via INTRAVENOUS

## 2014-11-09 MED ORDER — DEXAMETHASONE 4 MG PO TABS: 4.0000 mg | ORAL_TABLET | Freq: Three times a day (TID) | ORAL | Status: DC

## 2014-11-09 NOTE — Telephone Encounter (Signed)
gv adn pritned appt sched and avs for pt for April and May 2016.....sent pt to radiology

## 2014-11-09 NOTE — Progress Notes (Signed)
[facility] Telephone:([phone]   Fax:([phone]) [phone]  OFFICE PROGRESS NOTE  [name], MD Dr. 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  2) stage IIB (T3, N1, M0) non-small cell lung cancer, adenocarcinoma diagnosed in June of 2014.   PRIOR THERAPY:  1) Status post 3 cycles of systemic chemotherapy with carboplatin and etoposide. Last dose was given on 07/16/2011.  2) Left video-assisted thoracoscopy, mini thoracotomy; wedge, posterior segment of left upper lobe; wedge, anterior segment of left lower lobe with node dissection.  3) Redo left video-assisted thoracoscopy, lysis of adhesions, wedge resection of left lower lobe, thoracoscopic left lower lobectomy under the care of Dr. Roxan Hockey on 05/03/2013.  4) Adjuvant chemotherapy with cisplatin 75 mg/M2 and Alimta 500 mg/M2 every 3 weeks, first dose given 06/28/2013. Status post 4 cycles.  CURRENT THERAPY: Observation.  CHEMOTHERAPY INTENT: Adjuvant/Curative.  CURRENT # OF CHEMOTHERAPY CYCLES: 0 CURRENT ANTIEMETICS: N/A  CURRENT SMOKING STATUS: Quit smoking 05/02/13  ORAL CHEMOTHERAPY AND CONSENT: None  CURRENT BISPHOSPHONATES USE: None  PAIN MANAGEMENT: 2/10  NARCOTICS INDUCED CONSTIPATION: None  LIVING WILL AND CODE STATUS: Full Code  INTERVAL HISTORY: [name] [name] 58 y.o. male returns to the clinic today for 4 months followup visit accompanied by his wife. The patient is feeling fine today with no specific complaints except for shortness of breath with exertion and left-sided chest pain secondary to his previous surgery. The patient also complained of headache started 3 weeks ago in addition to occasional episode of vomiting.  The patient denied having any fever or chills. He denied having any cough or hemoptysis. He has no weight loss or night  sweats. He has repeat CT scan of the chest performed recently and he is here for evaluation and discussion of his scan results.  MEDICAL HISTORY: Past Medical History  Diagnosis Date  . Hypercholesteremia     takes Pravastatin nightly  . HTN (hypertension)     takes Lisinopril and HCTZ daily  . Shortness of breath     with exertion;Albuterol inhaler prn  . Insomnia     takes Xanax prn  . Lung cancer dx'd 2012    ALLERGIES:  has No Known Allergies.  MEDICATIONS:  Current Outpatient Prescriptions  Medication Sig Dispense Refill  . albuterol (PROVENTIL HFA;VENTOLIN HFA) 108 (90 BASE) MCG/ACT inhaler Inhale 1-2 puffs into the lungs every 4 (four) hours as needed for wheezing.    Marland Kitchen albuterol (PROVENTIL) (2.5 MG/3ML) 0.083% nebulizer solution Take 3 mLs (2.5 mg total) by nebulization every 4 (four) hours as needed for wheezing or shortness of breath. 75 mL 12  . aspirin EC 81 MG tablet Take 81 mg by mouth every morning.    . benzonatate (TESSALON PERLES) 100 MG capsule Take 1 capsule (100 mg total) by mouth 3 (three) times daily as needed for cough. 30 capsule 0  . lisinopril-hydrochlorothiazide (PRINZIDE,ZESTORETIC) 20-25 MG per tablet Take 1 tablet by mouth every morning.     . pravastatin (PRAVACHOL) 40 MG tablet Take 40 mg by mouth at bedtime.     . Tiotropium Bromide-Olodaterol (STIOLTO RESPIMAT) 2.5-2.5 MCG/ACT AERS Inhale 2 puffs into the lungs daily. 4 g 5   No current facility-administered medications for this visit.    SURGICAL HISTORY:  Past Surgical History  Procedure Laterality Date  . Left video-assisted thoracoscopy, mini  thoracotomy; wedge, posterior segment of left upper lobe; wedge, anterior segment of left lower lobe with node dissection.  09/02/11    [facility]  . Video assisted thoracoscopy Left 05/03/2013    Procedure: REDO VIDEO ASSISTED THORACOSCOPY;  Surgeon: Melrose Nakayama, MD;  Location: [facility];  Service: Thoracic;  Laterality: Left;  REDO LEFT VATS  .  Thoracotomy Left 05/03/2013    Procedure: THORACOTOMY MAJOR;  Surgeon: Melrose Nakayama, MD;  Location: [facility];  Service: Thoracic;  Laterality: Left;  . Lobectomy Left 05/03/2013    Procedure: LOBECTOMY;  Surgeon: Melrose Nakayama, MD;  Location: [facility];  Service: Thoracic;  Laterality: Left;  . Wedge resection Left 05/03/2013    Procedure: WEDGE RESECTION;  Surgeon: Melrose Nakayama, MD;  Location: [facility];  Service: Thoracic;  Laterality: Left;  . Open reduction internal fixation (orif) tibia/fibula fracture Right 07/01/2013    Procedure: OPEN REDUCTION INTERNAL FIXATION TIBIAL  FRACTURE;  Surgeon: Johnn Hai, MD;  Location: [facility];  Service: Orthopedics;  Laterality: Right;    REVIEW OF SYSTEMS:  A comprehensive review of systems was negative except for: Constitutional: positive for fatigue Musculoskeletal: positive for arthralgias Neurological: positive for headaches   PHYSICAL EXAMINATION: General appearance: alert, cooperative, fatigued and no distress Head: Normocephalic, without obvious abnormality, atraumatic Neck: no adenopathy, no JVD, supple, symmetrical, trachea midline and thyroid not enlarged, symmetric, no tenderness/mass/nodules Lymph nodes: Cervical, supraclavicular, and axillary nodes normal. Resp: clear to auscultation bilaterally Back: symmetric, no curvature. ROM normal. No CVA tenderness. Cardio: regular rate and rhythm, S1, S2 normal, no murmur, click, rub or gallop GI: soft, non-tender; bowel sounds normal; no masses,  no organomegaly Extremities: extremities normal, atraumatic, no cyanosis or edema Neurologic: Alert and oriented X 3, normal strength and tone. Normal symmetric reflexes. Normal coordination and gait  ECOG PERFORMANCE STATUS: 1 - Symptomatic but completely ambulatory  Blood pressure 151/80, pulse 71, temperature 97.7 F (36.5 C), temperature source Oral, resp. rate 18, height 6\' 1"  (1.854 m), weight 261 lb 3.2 oz (118.48 kg), SpO2 100  %.  LABORATORY DATA: Lab Results  Component Value Date   WBC 17.4* 11/02/2014   HGB 12.6* 11/02/2014   HCT 38.2* 11/02/2014   MCV 81.3 11/02/2014   PLT 274 11/02/2014      Chemistry      Component Value Date/Time   NA 140 11/02/2014 1000   NA 139 09/21/2013 1438   NA 138 03/25/2012 1225   K 4.7 11/02/2014 1000   K 3.8 09/21/2013 1438   K 4.2 03/25/2012 1225   CL 102 09/21/2013 1438   CL 105 03/03/2013 1002   CL 98 03/25/2012 1225   CO2 23 11/02/2014 1000   CO2 32 07/04/2013 0455   CO2 26 03/25/2012 1225   BUN 25.5 11/02/2014 1000   BUN 15 09/21/2013 1438   BUN 11 03/25/2012 1225   CREATININE 1.6* 11/02/2014 1000   CREATININE 1.00 09/21/2013 1438   CREATININE 1.0 03/25/2012 1225      Component Value Date/Time   CALCIUM 9.3 11/02/2014 1000   CALCIUM 9.1 07/04/2013 0455   CALCIUM 8.6 03/25/2012 1225   ALKPHOS 87 11/02/2014 1000   ALKPHOS 51 07/02/2013 0455   ALKPHOS 84 03/25/2012 1225   AST 19 11/02/2014 1000   AST 23 07/02/2013 0455   AST 33 03/25/2012 1225   ALT 27 11/02/2014 1000   ALT 34 07/02/2013 0455   ALT 43 03/25/2012 1225   BILITOT 0.39 11/02/2014 1000  BILITOT 0.5 07/02/2013 0455   BILITOT 0.80 03/25/2012 1225       RADIOGRAPHIC STUDIES: Ct Chest W Contrast  11/02/2014   CLINICAL DATA:  58 year old male with history of lung cancer diagnosed in 2012 with recurrence diagnosed in 2013 status post surgical resection x2. Chemotherapy completed in 2014.  EXAM: CT CHEST WITH CONTRAST  TECHNIQUE: Multidetector CT imaging of the chest was performed during intravenous contrast administration.  CONTRAST:  64mL OMNIPAQUE IOHEXOL 300 MG/ML  SOLN  COMPARISON:  Chest CT 06/23/2014.  FINDINGS: Mediastinum: Heart size is normal. There is no significant pericardial fluid, thickening or pericardial calcification. There is atherosclerosis of the thoracic aorta, the great vessels of the mediastinum and the coronary arteries, including calcified atherosclerotic plaque in  the left main, left anterior descending, left circumflex and right coronary arteries. No pathologically enlarged mediastinal or hilar lymph nodes. Esophagus is unremarkable in appearance.  Lungs/Pleura: Postoperative changes of left lower lobectomy and left upper lobe wedge resection are again noted. Architectural distortion in the perihilar aspect of the left lung is unchanged, compatible with mild postoperative scarring. Again noted are multiple ill-defined ground-glass attenuation nodules throughout the right lung, largest of which measures 1.3 cm in the right upper lobe (image 26 of series 5). These appear very similar to prior examinations dating back to 03/25/2014. 4 mm right lower lobe nodule (image 46 of series 5) is also unchanged dating back to 05/20/2011, and can be considered benign requiring no imaging followup. No acute consolidative airspace disease. No pleural effusions.  Upper Abdomen: Unremarkable.  Musculoskeletal: There are no aggressive appearing lytic or blastic lesions noted in the visualized portions of the skeleton. Postthoracotomy changes in the left hemithorax redemonstrated.  IMPRESSION: 1. Today's study demonstrates stable postoperative findings in the lungs, without evidence to suggest local recurrence of disease or new metastatic disease. 2. Multiple previously noted ground-glass attenuation nodules scattered throughout the right lung appears similar to recent prior examinations. Their persistence over time remains concerning, and continued attention on future followup studies is recommended. At this time, none of these demonstrate a central solid component. 3. Atherosclerosis, including left main and 3 vessel coronary artery disease. Please note that although the presence of coronary artery calcium documents the presence of coronary artery disease, the severity of this disease and any potential stenosis cannot be assessed on this non-gated CT examination. Assessment for potential risk  factor modification, dietary therapy or pharmacologic therapy may be warranted, if clinically indicated.   Electronically Signed   By: Vinnie Langton M.D.   On: 11/02/2014 14:01    ASSESSMENT AND PLAN: This is a very pleasant 58 years old white male with   1) Stage IIB non-small cell lung cancer, adenocarcinoma status post resection and completed 4 cycles of adjuvant chemotherapy with cisplatin and Alimta.  Has been observation with no significant new complaints except for the headache. His recent CT scan of the chest showed no significant evidence for disease progression. I discussed the scan results with the patient. I recommended for him to continue on observation with repeat CT scan of the chest in 4 months.  2) persistent headache: This has been going on for the last 3 weeks. Could be secondary to sinus infection versus metastatic brain lesion. I will order CT of the head to rule out brain metastases. The patient is not interested in proceeding with MRI of the brain unless it is absolutely necessary.   3) leukocytosis: Most likely secondary to sinus infection, I will start the  patient on Z-Pak and he was advised to see his primary care physician is no improvement.  4) COPD: Followed by Dr. Lamonte Sakai and he is currently on Spiriva and albuterol inhaler and nebulizers.  5) hypertension: Continue lisinopril/HCTZ.  He was advised to call immediately if he has any concerning symptoms in the interval. The patient voices understanding of current disease status and treatment options and is in agreement with the current care plan.  All questions were answered. The patient knows to call the clinic with any problems, questions or concerns. We can certainly see the patient much sooner if necessary.  Disclaimer: This note was dictated with voice recognition software. Similar sounding words can inadvertently be transcribed and may not be corrected upon review.

## 2014-11-09 NOTE — Telephone Encounter (Signed)
Received call report from Willacy @ [facility] CT dept of pt's head CT done today.  Report printed and gave to [facility], Chief Strategy Officer. Escorted pt to subwait as per Stephanie's request.

## 2014-11-10 ENCOUNTER — Other Ambulatory Visit: Payer: Self-pay | Admitting: Neurosurgery

## 2014-11-10 ENCOUNTER — Telehealth: Payer: Self-pay | Admitting: Radiation Oncology

## 2014-11-10 ENCOUNTER — Telehealth: Payer: Self-pay | Admitting: *Deleted

## 2014-11-10 ENCOUNTER — Encounter: Payer: Self-pay | Admitting: Internal Medicine

## 2014-11-10 NOTE — Telephone Encounter (Signed)
Per Dr. Julien Nordmann: continue the Zpack and the steroids (this will help his headache by decreasing swelling). Ok to take any leftover Percocet he may have from his ortho physician. Called wife and spoke with her (patient was outside in his building). Confirms he is taking these and he does have a few Percocet left to try. She knows to take him to ER for any neuro changes, seizures, change in speech. She says his birthday is [birth year] and begs to keep him alive to celebrate his birthday.

## 2014-11-10 NOTE — Telephone Encounter (Signed)
Per Dr. Johny Shears order phoned in Ativan 1 mg tablet to [facility] in [place]. One tablet by mouth po before bed and one tablet 30 minutes prior to MRI. Qty 30. No refills. Phoned wife to explain script was called in and the directions. She verbalized understanding.

## 2014-11-10 NOTE — Telephone Encounter (Signed)
Attempted to call patient in response to his MyChart message regarding headaches that are "unbearable". Noted on 12/30 visit he was started on Zpack. Will respond via MyChart also.

## 2014-11-18 ENCOUNTER — Ambulatory Visit
Admission: RE | Admit: 2014-11-18 | Discharge: 2014-11-18 | Disposition: A | Discharge status: Home / Self Care | Payer: BLUE CROSS/BLUE SHIELD | Source: Ambulatory Visit | Attending: Radiation Oncology | Admitting: Radiation Oncology

## 2014-11-18 DIAGNOSIS — C7931 Secondary malignant neoplasm of brain: Secondary | ICD-10-CM

## 2014-11-18 MED ORDER — GADOBENATE DIMEGLUMINE 529 MG/ML IV SOLN
20.0000 mL | Freq: Once | INTRAVENOUS | Status: AC | PRN
  Administered 2014-11-18: 20 mL via INTRAVENOUS

## 2014-11-21 ENCOUNTER — Encounter: Payer: Self-pay | Admitting: Radiation Oncology

## 2014-11-21 ENCOUNTER — Ambulatory Visit
Admission: RE | Admit: 2014-11-21 | Discharge: 2014-11-21 | Disposition: A | Discharge status: Home / Self Care | Payer: BLUE CROSS/BLUE SHIELD | Source: Ambulatory Visit | Attending: Radiation Oncology | Admitting: Radiation Oncology

## 2014-11-21 VITALS — BP 124/85 | HR 89 | Resp 16 | Ht 73.0 in | Wt 257.2 lb

## 2014-11-21 DIAGNOSIS — Z79899 Other long term (current) drug therapy: Secondary | ICD-10-CM | POA: Insufficient documentation

## 2014-11-21 DIAGNOSIS — C7931 Secondary malignant neoplasm of brain: Secondary | ICD-10-CM

## 2014-11-21 DIAGNOSIS — Z87891 Personal history of nicotine dependence: Secondary | ICD-10-CM | POA: Insufficient documentation

## 2014-11-21 DIAGNOSIS — Z510 Encounter for antineoplastic radiation therapy: Secondary | ICD-10-CM | POA: Diagnosis not present

## 2014-11-21 DIAGNOSIS — Z902 Acquired absence of lung [part of]: Secondary | ICD-10-CM | POA: Insufficient documentation

## 2014-11-21 DIAGNOSIS — C61 Malignant neoplasm of prostate: Secondary | ICD-10-CM

## 2014-11-21 DIAGNOSIS — Z85118 Personal history of other malignant neoplasm of bronchus and lung: Secondary | ICD-10-CM | POA: Diagnosis not present

## 2014-11-21 DIAGNOSIS — Z809 Family history of malignant neoplasm, unspecified: Secondary | ICD-10-CM | POA: Insufficient documentation

## 2014-11-21 DIAGNOSIS — I10 Essential (primary) hypertension: Secondary | ICD-10-CM | POA: Insufficient documentation

## 2014-11-21 DIAGNOSIS — E780 Pure hypercholesterolemia: Secondary | ICD-10-CM | POA: Diagnosis not present

## 2014-11-21 MED ORDER — SODIUM CHLORIDE 0.9 % IJ SOLN
10.0000 mL | Freq: Once | INTRAMUSCULAR | Status: AC
  Administered 2014-11-21: 10 mL via INTRAVENOUS

## 2014-11-21 NOTE — Progress Notes (Signed)
Location/Histology of Brain Tumor: Two large metastases showing necrosis, 1 in the peripheral left cerebellum measuring 3 x 2.4 x 2.4 cm and the other within the peripheral right parieto-occipital region measuring 3.3 x 2.5 x 3.2 cm Patient presented with symptoms of:  Complained of headache and occasional episodes of vomiting starting first week of December  Past or anticipated interventions, if any, per neurosurgery: surgery scheduled for 11/25/2014  Past or anticipated interventions, if any, per medical oncology:  PRIOR THERAPY:  1) Status post 3 cycles of systemic chemotherapy with carboplatin and etoposide. Last dose was given on 07/16/2011.  2) Left video-assisted thoracoscopy, mini thoracotomy; wedge, posterior segment of left upper lobe; wedge, anterior segment of left lower lobe with node dissection.  3) Redo left video-assisted thoracoscopy, lysis of adhesions, wedge resection of left lower lobe, thoracoscopic left lower lobectomy under the care of Dr. Roxan Hockey on 05/03/2013.  4) Adjuvant chemotherapy with cisplatin 75 mg/M2 and Alimta 500 mg/M2 every 3 weeks, first dose given 06/28/2013. Status post 4 cycles.  CURRENT THERAPY: Observation.  Dose of Decadron, if applicable: decadron 4 mg tid  Recent neurologic symptoms, if any:   Seizures: no  Headaches: yes  Nausea: yes  Dizziness/ataxia: yes  Difficulty with hand coordination: intermittently   Focal numbness/weakness: no  Visual deficits/changes: no  Confusion/Memory deficits: no  Painful bone metastases at present, if any: no  SAFETY ISSUES:  Prior radiation? no  Pacemaker/ICD? no  Possible current pregnancy? no  Is the patient on methotrexate? no  Additional Complaints / other details: 59 year old male. Married. NKDA. Complains of shortness of breath with exertion and left sided chest pain secondary to his previous surgery. Denies fever or chills. Denies cough or hemoptysis. Denies weight loss or night  sweats.

## 2014-11-21 NOTE — Progress Notes (Signed)
Radiation Oncology         [phone] ________________________________  Initial outpatient Consultation  Name: [name] [name] MRN: [id number]  Date: 11/21/2014  DOB: 1956/03/11  PI:RJJOACZ,YSAYT L, MD  Curt Bears, MD   REFERRING PHYSICIAN: Curt Bears, MD  DIAGNOSIS: The encounter diagnosis was Brain metastases.    ICD-9-CM ICD-10-CM   1. Brain metastases 198.3 C79.31     HISTORY OF PRESENT ILLNESS::[name] [name] is a 59 y.o. male who was found to have a left upper lung mass in 2012. 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 The patient underwent 3 cycles of chemotherapy followed by left upper lung wedge resection as well as wedge resection of the left lower lobe and lymph node dissection. This showed minimal viable tumor remaining. The patient had a recurrent lung nodule requiring repeat thoracotomy in June 2014. At this point, PET scan suggested lymph node involvement. He underwent wedge resection of the left lower lung nodule confirming non-small cell cancer and this was followed by left lower lobectomy and lymph node dissection. At this point, the patient had adenocarcinoma with lymph node involvement. The patient was given chemotherapy in the adjuvant setting completed in August 2014. He recently developed headaches and nausea prompting head CT which showed 2 brain metastases. The patient had follow-up MRI for further evaluation and this confirmed the presence of 2 brain metastases. Skeleton referred today for consideration of possible radiation treatment options.  PREVIOUS RADIATION THERAPY: No  PAST MEDICAL HISTORY:  has a past medical history of Hypercholesteremia; HTN (hypertension); Shortness of breath; Insomnia; Lung cancer (dx'd 2012); and Brain cancer.    PAST SURGICAL HISTORY: Past Surgical History  Procedure Laterality Date  . Left video-assisted thoracoscopy, mini thoracotomy; wedge, posterior segment of left upper  lobe; wedge, anterior segment of left lower lobe with node dissection.  09/02/11    [facility]  . Video assisted thoracoscopy Left 05/03/2013    Procedure: REDO VIDEO ASSISTED THORACOSCOPY;  Surgeon: Melrose Nakayama, MD;  Location: [facility] [facility];  Service: Thoracic;  Laterality: Left;  REDO LEFT VATS  . Thoracotomy Left 05/03/2013    Procedure: THORACOTOMY MAJOR;  Surgeon: Melrose Nakayama, MD;  Location: [place];  Service: Thoracic;  Laterality: Left;  . Lobectomy Left 05/03/2013    Procedure: LOBECTOMY;  Surgeon: Melrose Nakayama, MD;  Location: [place];  Service: Thoracic;  Laterality: Left;  . Wedge resection Left 05/03/2013    Procedure: WEDGE RESECTION;  Surgeon: Melrose Nakayama, MD;  Location: [place];  Service: Thoracic;  Laterality: Left;  . Open reduction internal fixation (orif) tibia/fibula fracture Right 07/01/2013    Procedure: OPEN REDUCTION INTERNAL FIXATION TIBIAL  FRACTURE;  Surgeon: Johnn Hai, MD;  Location: [facility];  Service: Orthopedics;  Laterality: Right;    FAMILY HISTORY: family history includes Cancer - Other in his mother.  SOCIAL HISTORY:  reports that he quit smoking about 18 months ago. His smoking use included Cigarettes. He has a 80 pack-year smoking history. He has quit using smokeless tobacco. His smokeless tobacco use included Chew. He reports that he drinks alcohol. He reports that he does not use illicit drugs.  ALLERGIES: Review of patient's allergies indicates no known allergies.  MEDICATIONS:  Current Outpatient Prescriptions  Medication Sig Dispense Refill  . albuterol (PROVENTIL HFA;VENTOLIN HFA) 108 (90 BASE) MCG/ACT inhaler Inhale 1-2 puffs into the lungs every 4 (four) hours as needed for wheezing.    Marland Kitchen albuterol (PROVENTIL) (2.5 MG/3ML) 0.083%  nebulizer solution Take 3 mLs (2.5 mg total) by nebulization every 4 (four) hours as needed for wheezing or shortness of breath. 75 mL 12  . ALPRAZolam (XANAX) 0.25 MG tablet Take 1 tablet (0.25 mg total)  by mouth 3 (three) times daily as needed for anxiety. 30 tablet 0  . dexamethasone (DECADRON) 4 MG tablet Take 1 tablet (4 mg total) by mouth 3 (three) times daily. 45 tablet 0  . lisinopril-hydrochlorothiazide (PRINZIDE,ZESTORETIC) 20-25 MG per tablet Take 1 tablet by mouth every morning.     . pravastatin (PRAVACHOL) 40 MG tablet Take 40 mg by mouth at bedtime.     . Tiotropium Bromide-Olodaterol (STIOLTO RESPIMAT) 2.5-2.5 MCG/ACT AERS Inhale 2 puffs into the lungs daily. 4 g 5  . aspirin EC 81 MG tablet Take 81 mg by mouth every morning.    . benzonatate (TESSALON PERLES) 100 MG capsule Take 1 capsule (100 mg total) by mouth 3 (three) times daily as needed for cough. (Patient not taking: Reported on 11/21/2014) 30 capsule 0   No current facility-administered medications for this encounter.    REVIEW OF SYSTEMS:  A 15 point review of systems is documented in the electronic medical record. This was obtained by the nursing staff. However, I reviewed this with the patient to discuss relevant findings and make appropriate changes.  Pertinent items are noted in HPI.   PHYSICAL EXAM:  height is 6\' 1"  (1.854 m) and weight is 257 lb 3.2 oz (116.665 kg). His blood pressure is 124/85 and his pulse is 89. His respiration is 16 and oxygen saturation is 100%.   The patient is in no acute distress today. Is alert and oriented. Motor strength is intact throughout. Mood and affect are appropriate. Speech is fluent articulate. Gait is steady.  KPS = 90  100 - Normal; no complaints; no evidence of disease. 90   - Able to carry on normal activity; minor signs or symptoms of disease. 80   - Normal activity with effort; some signs or symptoms of disease. 21   - Cares for self; unable to carry on normal activity or to do active work. 60   - Requires occasional assistance, but is able to care for most of his personal needs. 50   - Requires considerable assistance and frequent medical care. 23   - Disabled;  requires special care and assistance. 27   - Severely disabled; hospital admission is indicated although death not imminent. 63   - Very sick; hospital admission necessary; active supportive treatment necessary. 10   - Moribund; fatal processes progressing rapidly. 0     - Dead  Karnofsky DA, Abelmann [place], Craver LS and Elbert JH [phone]) The use of the nitrogen mustards in the palliative treatment of carcinoma: with particular reference to bronchogenic carcinoma Cancer 1 [phone]  LABORATORY DATA:  Lab Results  Component Value Date   WBC 17.4* 11/02/2014   HGB 12.6* 11/02/2014   HCT 38.2* 11/02/2014   MCV 81.3 11/02/2014   PLT 274 11/02/2014   Lab Results  Component Value Date   NA 140 11/02/2014   K 4.7 11/02/2014   CL 102 09/21/2013   CO2 23 11/02/2014   Lab Results  Component Value Date   ALT 27 11/02/2014   AST 19 11/02/2014   ALKPHOS 87 11/02/2014   BILITOT 0.39 11/02/2014     RADIOGRAPHY: Ct Head W Wo Contrast  11/09/2014   CLINICAL DATA:  Left-sided headaches with nausea and vomiting. Personal history of lung  cancer.  EXAM: CT HEAD WITHOUT AND WITH CONTRAST  TECHNIQUE: Contiguous axial images were obtained from the base of the skull through the vertex without and with intravenous contrast  CONTRAST:  148mL OMNIPAQUE IOHEXOL 300 MG/ML  SOLN  COMPARISON:  CT head without contrast 07/02/2013.  FINDINGS: A peripherally enhancing mass lesion in the left cerebellum measures 3.2 x 2.4 cm. There is significant surrounding vasogenic edema with mass effect on the fourth ventricle.  A hyperdense mass in the posterior right parietal lobe demonstrates some enhancement as well. The lesion measures 3.1 x 2.2 cm. There is surrounding vasogenic edema and mass effect. 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 No other focal metastatic lesions are evident.  No acute cortical infarct is present. The ventricles are of normal  size. No significant extra-axial fluid collection is present.  IMPRESSION: 1. Hyperdense peripherally enhancing mass lesion in the left cerebellum measures 3.2 x 2.4 cm, compatible with a focal metastasis, potentially hemorrhagic. 2. Hyperdense mass lesion in the posterior right parietal lobe measures 3.1 x 2.2 cm. This may be a hyperdense lesion although some hemorrhage is suspected as well. 3. Prominent vasogenic edema is associated with both lesions. There is some mass effect on the fourth ventricle without hydrocephalus.   Electronically Signed   By: Lawrence Santiago M.D.   On: 11/09/2014 11:21   Ct Chest W Contrast  11/02/2014   CLINICAL DATA:  59 year old male with history of lung cancer diagnosed in 2012 with recurrence diagnosed in 2013 status post surgical resection x2. Chemotherapy completed in 2014.  EXAM: CT CHEST WITH CONTRAST  TECHNIQUE: Multidetector CT imaging of the chest was performed during intravenous contrast administration.  CONTRAST:  60mL OMNIPAQUE IOHEXOL 300 MG/ML  SOLN  COMPARISON:  Chest CT 06/23/2014.  FINDINGS: Mediastinum: Heart size is normal. There is no significant pericardial fluid, thickening or pericardial calcification. There is atherosclerosis of the thoracic aorta, the great vessels of the mediastinum and the coronary arteries, including calcified atherosclerotic plaque in the left main, left anterior descending, left circumflex and right coronary arteries. No pathologically enlarged mediastinal or hilar lymph nodes. Esophagus is unremarkable in appearance.  Lungs/Pleura: Postoperative changes of left lower lobectomy and left upper lobe wedge resection are again noted. Architectural distortion in the perihilar aspect of the left lung is unchanged, compatible with mild postoperative scarring. Again noted are multiple ill-defined ground-glass attenuation nodules throughout the right lung, largest of which measures 1.3 cm in the right upper lobe (image 26 of series 5). These  appear very similar to prior examinations dating back to 03/25/2014. 4 mm right lower lobe nodule (image 46 of series 5) is also unchanged dating back to 05/20/2011, and can be considered benign requiring no imaging followup. No acute consolidative airspace disease. No pleural effusions.  Upper Abdomen: Unremarkable.  Musculoskeletal: There are no aggressive appearing lytic or blastic lesions noted in the visualized portions of the skeleton. Postthoracotomy changes in the left hemithorax redemonstrated.  IMPRESSION: 1. Today's study demonstrates stable postoperative findings in the lungs, without evidence to suggest local recurrence of disease or new metastatic disease. 2. Multiple previously noted ground-glass attenuation nodules scattered throughout the right lung appears similar to recent prior examinations. Their persistence over time remains concerning, and continued attention on future followup studies is recommended. At this time, none of these demonstrate a central solid component. 3. Atherosclerosis, including left main and 3 vessel coronary artery disease. Please note that although the presence  of coronary artery calcium documents the presence of coronary artery disease, the severity of this disease and any potential stenosis cannot be assessed on this non-gated CT examination. Assessment for potential risk factor modification, dietary therapy or pharmacologic therapy may be warranted, if clinically indicated.   Electronically Signed   By: Vinnie Langton M.D.   On: 11/02/2014 14:01   Mr Jeri Cos EU Contrast  11/18/2014   CLINICAL DATA:  New diagnosis metastatic disease from known lung cancer. S RS targeting.  EXAM: MRI HEAD WITHOUT AND WITH CONTRAST  TECHNIQUE: Multiplanar, multiecho pulse sequences of the brain and surrounding structures were obtained without and with intravenous contrast.  CONTRAST:  89mL MULTIHANCE GADOBENATE DIMEGLUMINE 529 MG/ML IV SOLN  COMPARISON:  Head CT 11/09/2014.  MRI  06/06/2011.  FINDINGS: Within the peripheral left cerebellar hemisphere, there is a centrally necrotic mass measuring 3 x 2.4 by 2.4 cm consistent with metastatic disease. No other cerebellar mass.  Within the right parieto-occipital peripheral brain, there is a centrally necrotic mass measuring 3.3 x 2.5 by 3.2 cm consistent with a metastasis.  No other lesion is identified. There is an incidental venous angioma in the left frontal lobe. There is no brain infarction. No hydrocephalus. No extra-axial collection. No visible skull or skullbase metastatic lesion.  IMPRESSION: Two large metastases showing necrosis, 1 in the peripheral left cerebellum measuring 3 x 2.4 x 2.4 cm and the other within the peripheral right parieto-occipital region measuring 3.3 x 2.5 x 3.2 cm. Both are associated with vasogenic edema but there is no significant mass effect/shift.   Electronically Signed   By: Nelson Chimes M.D.   On: 11/18/2014 16:46      IMPRESSION:  This patient is a very nice 59 yo man with two brain metastases. The patient would benefit from surgical resection of his brain metastases.* In addition, the patient would potentially benefit from radiotherapy.* The options include whole brain irradiation versus stereotactic radiosurgery. There are pros and cons associated with each of these potential treatment options. Whole brain radiotherapy would treat the known metastatic deposits and help provide some reduction of risk for future brain metastases. However, whole brain radiotherapy carries potential risks including hair loss, subacute somnolence, and neurocognitive changes including a possible reduction in short-term memory. Whole brain radiotherapy also may carry a lower likelihood of tumor control at the treatment sites because of the low-dose used. Stereotactic radiosurgery carries a higher likelihood for local tumor control at the targeted sites with lower associated risk for neurocognitive changes such as memory  loss.* However, the use of stereotactic radiosurgery in this setting may leave the patient at increased risk for new brain metastases elsewhere in the brain as high as 50-60%. Accordingly, patients who receive stereotactic radiosurgery in this setting should undergo ongoing surveillance imaging with brain MRI more frequently in order to identify and treat new small brain metastases before they become symptomatic. Stereotactic radiosurgery does carry some different risks, including a risk of radionecrosis.  PLAN: Today, I reviewed the findings and workup thus far with the patient. We discussed the dilemma regarding whole brain radiotherapy versus stereotactic radiosurgery. We discussed the pros and cons of each. We also discussed the logistics and delivery of each. We reviewed the results associated with each of the treatments described above. The patient seems to understand the treatment options and would like to proceed with stereotactic radiosurgery.  In terms of timing of the stereotactic radiosurgery, evidence suggests that the risk of radionecrosis and leptomeningeal recurrence is  lower when used in the pre-operative setting as opposed to post-operative SRS.*  The patient will be set up for [facility] CT Simulation, SRS Treatment, and Surgical resection.  I spent face to face time with the patient and more than 50% of that time was spent in counseling and/or coordination of care.    ------------------------------------------------  Sheral Apley. Tammi Klippel, M.D.    *References:  1: Patchell RA, Tibbs PA, Helene Shoe, [address] RJ, [place], Guy Sandifer JS, Young B. A randomized trial of surgery in the treatment of single metastases to the brain. Jackson Feb 22;322(8):494-500. PubMed PMID: 3235573.   2: Patchell RA, Tibbs PA, Regine WF, Jonita Albee, Mohiuddin M, Arrie Eastern, [place], [place], Young B. Postoperative radiotherapy in the treatment of single metastases  to the brain: a randomized trial. JAMA. 1998 Nov 4;280(17):1485-9. PubMed PMID: 2202542.   3: Erlene Senters, Wenda Low, Hess KR, Tomie China, Lang FF, Kornguth DG, [place], Swint JM, Shiu AS, Maor MH, [place]. Neurocognition in patients with brain metastases treated with radiosurgery or radiosurgery plus whole-brain irradiation: a randomised controlled trial. Lancet Oncol. 2009 Nov;10(11):1037-44. doi: 10.1016/S1470-2045(09)70263-3. Epub 2009 Oct 2. PubMed PMID: 70623762.  4: Weyman Rodney, Estill Dooms, Coralee Pesa, Crocker IR, Lorie Phenix, Charlesetta Garibaldi, Press RH, Tanya Nones, Underwood NM, Wait SD, Higinio Plan, Shu HG, [place]. Comparing Preoperative With Postoperative Stereotactic Radiosurgery for Resectable Brain Metastases: A Multi-institutional Analysis. Neurosurgery. 2015 Nov 2. [Epub ahead of print] PubMed PMID: [id number].

## 2014-11-21 NOTE — Progress Notes (Signed)
See progress noted under physician encounter.

## 2014-11-21 NOTE — Progress Notes (Signed)
Patient presented for consult, IV start and simulation. Patient accompanied by his wife. Patient took an Ativan tablet he brought from home. BUN WDL. Creatinine slightly elevated. Dr. Tammi Klippel approved contrast. Denies taking Metformin. Denies having a pacemaker or history of xrt. Reports that he has NKDA. Started right AC 22 gauge IV. Excellent blood return. Flushed without complication. Patient tolerated well. Treatment scheduled for 11/24/2014 and surgery 11/25/2014.

## 2014-11-21 NOTE — Progress Notes (Signed)
  Radiation Oncology         [phone] ________________________________  Name: [name] [name] MRN: [id number]  Date: 11/21/2014  DOB: [birth year]  SIMULATION AND TREATMENT PLANNING NOTE    ICD-9-CM ICD-10-CM   1. Brain metastases 198.3 C79.31     DIAGNOSIS:  59 yo man with 2 brain metastases  NARRATIVE:  The patient was brought to the [facility].  Identity was confirmed.  All relevant records and images related to the planned course of therapy were reviewed.  The patient freely provided informed written consent to proceed with treatment after reviewing the details related to the planned course of therapy. The consent form was witnessed and verified by the simulation staff. Intravenous access was established for contrast administration. Then, the patient was set-up in a stable reproducible supine position for radiation therapy.  A relocatable thermoplastic stereotactic head frame was fabricated for precise immobilization.  CT images were obtained.  Surface markings were placed.  The CT images were loaded into the planning software and fused with the patient's targeting MRI scan.  Then the target and avoidance structures were contoured.  Treatment planning then occurred.  The radiation prescription was entered and confirmed.  I have requested 3D planning  I have requested a DVH of the following structures: Brain stem, brain, left eye, right eye, lenses, optic chiasm, target volumes, uninvolved brain, and normal tissue.    PLAN:  The patient will receive 15-18 Gy in one fraction with pre-operative intent.  ________________________________  Sheral Apley. Tammi Klippel, M.D.

## 2014-11-21 NOTE — Progress Notes (Signed)
FMLA paperwork rec'd - forward to RN for processing

## 2014-11-22 ENCOUNTER — Telehealth: Payer: Self-pay | Admitting: Radiation Oncology

## 2014-11-22 DIAGNOSIS — Z510 Encounter for antineoplastic radiation therapy: Secondary | ICD-10-CM | POA: Diagnosis not present

## 2014-11-22 NOTE — Telephone Encounter (Signed)
Placed orange folder with completed FMLA forms in Dr. Johny Shears inbox to sign.

## 2014-11-22 NOTE — Pre-Procedure Instructions (Signed)
[name] [name]  11/22/2014   Your procedure is scheduled on:  Friday, January 15th  Report to [facility].  Call this number if you have problems the morning of surgery: [phone]   Remember:   Do not eat food or drink liquids after midnight.   Take these medicines the morning of surgery with A SIP OF WATER: xanax if needed, inhalers   Do not wear jewelry  Do not wear lotions, powders, or perfumes, deodorant.  Do not shave 48 hours prior to surgery. Men may shave face and neck.  Do not bring valuables to the hospital.  [facility] is not responsible  for any belongings or valuables.               Contacts, dentures or bridgework may not be worn into surgery.  Leave suitcase in the car. After surgery it may be brought to your room.  For patients admitted to the hospital, discharge time is determined by your  treatment team.          Please read over the following fact sheets that you were given: Pain Booklet, Coughing and Deep Breathing, Blood Transfusion Information, MRSA Information and Surgical Site Infection Prevention  [place] - Preparing for Surgery  Before surgery, you can play an important role.  Because skin is not sterile, your skin needs to be as free of germs as possible.  You can reduce the number of germs on you skin by washing with CHG (chlorahexidine gluconate) soap before surgery.  CHG is an antiseptic cleaner which kills germs and bonds with the skin to continue killing germs even after washing.  Please DO NOT use if you have an allergy to CHG or antibacterial soaps.  If your skin becomes reddened/irritated stop using the CHG and inform your nurse when you arrive at Short Stay.  Do not shave (including legs and underarms) for at least 48 hours prior to the first CHG shower.  You may shave your face.  Please follow these instructions carefully:   1.  Shower with CHG Soap the night before surgery and the morning of Surgery.  2.  If you  choose to wash your hair, wash your hair first as usual with your normal shampoo.  3.  After you shampoo, rinse your hair and body thoroughly to remove the shampoo.  4.  Use CHG as you would any other liquid soap.  You can apply CHG directly to the skin and wash gently with scrungie or a clean washcloth.  5.  Apply the CHG Soap to your body ONLY FROM THE NECK DOWN.  Do not use on open wounds or open sores.  Avoid contact with your eyes, ears, mouth and genitals (private parts).  Wash genitals (private parts) with your normal soap.  6.  Wash thoroughly, paying special attention to the area where your surgery will be performed.  7.  Thoroughly rinse your body with warm water from the neck down.  8.  DO NOT shower/wash with your normal soap after using and rinsing off the CHG Soap.  9.  Pat yourself dry with a clean towel.            10.  Wear clean pajamas.            11.  Place clean sheets on your bed the night of your first shower and do not sleep with pets.  Day of Surgery  Do not apply any lotions/deoderants the morning  of surgery.  Please wear clean clothes to the hospital/surgery center.

## 2014-11-23 ENCOUNTER — Inpatient Hospital Stay (HOSPITAL_COMMUNITY): Payer: BLUE CROSS/BLUE SHIELD | Admitting: Vascular Surgery

## 2014-11-23 ENCOUNTER — Encounter (HOSPITAL_COMMUNITY)
Admission: RE | Admit: 2014-11-23 | Discharge: 2014-11-23 | Disposition: A | Discharge status: Home / Self Care | Payer: BLUE CROSS/BLUE SHIELD | Source: Ambulatory Visit | Attending: Neurosurgery | Admitting: Neurosurgery

## 2014-11-23 ENCOUNTER — Encounter (HOSPITAL_COMMUNITY): Payer: Self-pay

## 2014-11-23 ENCOUNTER — Inpatient Hospital Stay (HOSPITAL_COMMUNITY): Payer: BLUE CROSS/BLUE SHIELD | Admitting: Anesthesiology

## 2014-11-23 DIAGNOSIS — C3490 Malignant neoplasm of unspecified part of unspecified bronchus or lung: Secondary | ICD-10-CM | POA: Diagnosis present

## 2014-11-23 DIAGNOSIS — C7931 Secondary malignant neoplasm of brain: Secondary | ICD-10-CM | POA: Diagnosis not present

## 2014-11-23 DIAGNOSIS — Z87891 Personal history of nicotine dependence: Secondary | ICD-10-CM | POA: Diagnosis not present

## 2014-11-23 DIAGNOSIS — E780 Pure hypercholesterolemia: Secondary | ICD-10-CM | POA: Diagnosis not present

## 2014-11-23 DIAGNOSIS — Z510 Encounter for antineoplastic radiation therapy: Secondary | ICD-10-CM | POA: Diagnosis not present

## 2014-11-23 DIAGNOSIS — C3412 Malignant neoplasm of upper lobe, left bronchus or lung: Secondary | ICD-10-CM | POA: Diagnosis not present

## 2014-11-23 DIAGNOSIS — I10 Essential (primary) hypertension: Secondary | ICD-10-CM | POA: Diagnosis not present

## 2014-11-23 DIAGNOSIS — Z79899 Other long term (current) drug therapy: Secondary | ICD-10-CM | POA: Diagnosis not present

## 2014-11-23 HISTORY — DX: Anxiety disorder, unspecified: F41.9

## 2014-11-23 HISTORY — DX: Unspecified osteoarthritis, unspecified site: M19.90

## 2014-11-23 LAB — BASIC METABOLIC PANEL
Anion gap: 9 (ref 5–15)
BUN: 38 mg/dL — AB (ref 6–23)
CHLORIDE: 97 meq/L (ref 96–112)
CO2: 25 mmol/L (ref 19–32)
Calcium: 8.6 mg/dL (ref 8.4–10.5)
Creatinine, Ser: 1.67 mg/dL — ABNORMAL HIGH (ref 0.50–1.35)
GFR, EST AFRICAN AMERICAN: 51 mL/min — AB (ref >90)
GFR, EST NON AFRICAN AMERICAN: 44 mL/min — AB (ref >90)
Glucose, Bld: 113 mg/dL — ABNORMAL HIGH (ref 70–99)
POTASSIUM: 4.2 mmol/L (ref 3.5–5.1)
Sodium: 131 mmol/L — ABNORMAL LOW (ref 135–145)

## 2014-11-23 LAB — CBC
HCT: 40.7 % (ref 39.0–52.0)
Hemoglobin: 13.6 g/dL (ref 13.0–17.0)
MCH: 26.7 pg (ref 26.0–34.0)
MCHC: 33.4 g/dL (ref 30.0–36.0)
MCV: 79.8 fL (ref 78.0–100.0)
PLATELETS: 210 10*3/uL (ref 150–400)
RBC: 5.1 MIL/uL (ref 4.22–5.81)
RDW: 14.5 % (ref 11.5–15.5)
WBC: 12.4 10*3/uL — ABNORMAL HIGH (ref 4.0–10.5)

## 2014-11-23 LAB — TYPE AND SCREEN
ABO/RH(D): B POS
ANTIBODY SCREEN: NEGATIVE

## 2014-11-23 NOTE — Progress Notes (Signed)
Anesthesia Chart Review: Patient is a 59 year old male scheduled for craniotomy for tumor excision 11/25/14 by Dr. Vertell Limber.  Patient with known lung cancer in 2012 and 2014 who reported new onset headaches with occasional vomiting in 10/2014.  Head CT showed a 3.2 X 2.4 cm mass in the left cerebellum compatible with focal metastasis, potentially hemorrhagic and a hyperdense 3.1 X 2.2 cm right posterior parietal lobe mass. There was prominent vasogenic edema associated with both lesions with some mass effect on the fourth ventricle without hydrocephalus.  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 PCP is listed as Dr. Daiva Eves. HEM-ONC is Dr. Julien Nordmann. RAD-ONC is Dr. Tammi Klippel. CT surgeon Dr. Roxan Hockey. Pulmonologist is Dr. Lamonte Sakai. Patient told his PAT RN that his breathing is much improved since starting his pulmonary regimen per Dr. Lamonte Sakai.  Meds include albuterol, Xanax, Decadron, lisinopril-HCTZ, Ativan, Pravachol, Stiolto Respimat.  EKG 07/09/14 [facility]): ST at 118 bpm. Non-specific T wave abnormality in inferior leads.   07/09/14 CXR [facility]): The heart is mildly enlarged. New RML airspace disease in concerning for PNA. Postsurgical changes of the left lung are stable. The visualized soft tissues and bony thorax are unremarkable.   PFT 04/20/13 (pre-op LL lobectomy): severe AFL with positive BD response, normal volumes, normal DLCO.  Severe obstructive airways disease-asthmatic type.  Preoperative labs noted. BUN/Cr 38/1.67 which appears stable since at least 03/2014. Na 131. WBC 12.4. H/H 13.5/40.7. Glucose 113. T&S done.  I  was called during his PAT visit regarding his NPO status since surgery was not until 3 PM.  He is not a diabetic. He wants to have oatmeal.  I told his PAT RN that from an anesthesia standpoint, he could have oatmeal by 6AM (as long as surgery stayed at 3PM) [place]. [place].  I notified Jessica at Dr. Melven Sartorius office.  Patient has an appointment with him today and will further discuss NPO status then.   Reviewed history with anesthesiologist Dr. Al Corpus.  If no acute respiratory issues then would not necessarily plan additional preoperative testing (ie, CXR) unless felt indicated by his assigned anesthesiologist on the day of surgery. If no acute changes then anticipate that he can proceed as planned.  George Hugh [facility] Short Stay Center/Anesthesiology Phone [phone] 11/23/2014 6:15 PM

## 2014-11-23 NOTE — Progress Notes (Addendum)
Pulmonary: Dr. Ernestina Penna Oncology: Dr. Julien Nordmann  Pt. And wife concerned of pt. Being npo at midnight, surgery not til 3 pm.  Randa Lynn notified. Stated to change npo status to 6 am , could eat something lite ie: oatmeat  if okay with Dr.Stern. Pt. Is seeing Dr. Vertell Limber today and will discuss with him.

## 2014-11-23 NOTE — Progress Notes (Signed)
   11/23/14 0916  OBSTRUCTIVE SLEEP APNEA  Have you ever been diagnosed with sleep apnea through a sleep study? No  Do you snore loudly (loud enough to be heard through closed doors)?  0  Do you often feel tired, fatigued, or sleepy during the daytime? 0  Has anyone observed you stop breathing during your sleep? 0  Do you have, or are you being treated for high blood pressure? 1  BMI more than 35 kg/m2? 0  Age over 58 years old? 1  Neck circumference greater than 40 cm/16 inches? 1  Gender: 1  Obstructive Sleep Apnea Score 4

## 2014-11-24 ENCOUNTER — Ambulatory Visit
Admission: RE | Admit: 2014-11-24 | Discharge: 2014-11-24 | Disposition: A | Discharge status: Home / Self Care | Payer: BLUE CROSS/BLUE SHIELD | Source: Ambulatory Visit | Attending: Radiation Oncology | Admitting: Radiation Oncology

## 2014-11-24 ENCOUNTER — Encounter: Payer: Self-pay | Admitting: Radiation Oncology

## 2014-11-24 ENCOUNTER — Telehealth: Payer: Self-pay | Admitting: Radiation Oncology

## 2014-11-24 VITALS — BP 132/82 | HR 65 | Temp 98.2°F | Resp 16

## 2014-11-24 DIAGNOSIS — C7931 Secondary malignant neoplasm of brain: Secondary | ICD-10-CM

## 2014-11-24 DIAGNOSIS — Z510 Encounter for antineoplastic radiation therapy: Secondary | ICD-10-CM | POA: Diagnosis not present

## 2014-11-24 MED ORDER — CEFAZOLIN SODIUM-DEXTROSE 2-3 GM-% IV SOLR
2.0000 g | INTRAVENOUS | Status: DC
  Filled 2014-11-24: qty 50

## 2014-11-24 NOTE — Progress Notes (Signed)
  Radiation Oncology         [phone] ________________________________  Name: [name] [name] MRN: [id number]  Date: 11/24/2014  DOB: 06/01/1956  End of Treatment Note   ICD-9-CM ICD-10-CM    1. Brain metastases 198.3 C79.31     DIAGNOSIS: 59 yo man with 2 brain metastases   Indication for treatment:  Palliation       Radiation treatment dates:   11/24/2014  Site/dose/beams/energy:   [name] received stereotactic radiosurgery to the following targets: Right parietal 3.3 cm target was treated using 4 Dynamic Conformal Arcs to a prescription dose of 15 Gy. ExacTrac registration was performed for each couch angle. The 83.3% isodose line was prescribed. 6 MV X-rays were delivered in the flattening filter free beam mode. Left cerebellar 3 cm target was treated using 3 Dynamic Conformal Arcs to a prescription dose of 18 Gy. ExacTrac registration was performed for each couch angle. The 82.8% isodose line was prescribed. 6 MV X-rays were delivered in the flattening filter free beam mode.  Narrative: The patient tolerated radiation treatment relatively well.     Plan: The patient has completed radiation treatment. The patient will proceed to surgical resection of both metastases and return to radiation oncology clinic for routine followup in one month. I advised them to call or return sooner if they have any questions or concerns related to their recovery or treatment. ________________________________  Sheral Apley. Tammi Klippel, M.D.

## 2014-11-24 NOTE — Telephone Encounter (Signed)
[place] folder with completed and signed FMLA paperwork returned to [employer].

## 2014-11-24 NOTE — Op Note (Signed)
  Name: CEJAY CAMBRE  MRN: [id number]  Date: 11/24/2014   DOB: [birth year]  Stereotactic Radiosurgery Operative Note  PRE-OPERATIVE DIAGNOSIS:  Multiple Brain Metastases  POST-OPERATIVE DIAGNOSIS:  Multiple Brain Metastases  PROCEDURE:  Stereotactic Radiosurgery  SURGEON:  Peggyann Shoals, MD  NARRATIVE: The patient underwent a radiation treatment planning session in the radiation oncology simulation suite under the care of the radiation oncology physician and physicist.  I participated closely in the radiation treatment planning afterwards. The patient underwent planning CT which was fused to 3T high resolution MRI with 1 mm axial slices.  These images were fused on the planning system.  We contoured the gross target volumes and subsequently expanded this to yield the Planning Target Volume. I actively participated in the planning process.  I helped to define and review the target contours and also the contours of the optic pathway, eyes, brainstem and selected nearby organs at risk.  All the dose constraints for critical structures were reviewed and compared to AAPM Task Group 101.  The prescription dose conformity was reviewed.  I approved the plan electronically.    Accordingly, [name] was brought to the TrueBeam stereotactic radiation treatment linac and placed in the custom immobilization mask.  The patient was aligned according to the IR fiducial markers with BrainLab Exactrac, then orthogonal x-rays were used in ExacTrac with the 6DOF robotic table and the shifts were made to align the patient  [name] received stereotactic radiosurgery uneventfully.    Lesions treated:  2   Complex lesions treated:  0 (>3.5 cm, <77mm of optic path, or within the brainstem)   The detailed description of the procedure is recorded in the radiation oncology procedure note.  I was present for the duration of the procedure.  DISPOSITION:  Following delivery, the patient was transported to nursing  in stable condition and monitored for possible acute effects to be discharged to home in stable condition with follow-up in one month.  Peggyann Shoals, MD 11/24/2014 2:38 PM

## 2014-11-24 NOTE — Progress Notes (Signed)
Nurse monitoring following SRS complete. Vitals stable. Patient reports that he has a headache related to wearing the "tight mask for treatment." Reports taking decadron 4 mg tid. Denies dizziness, diplopia or ringing in the ears. Understands to contact staff with future needs. Scheduled for surgery tomorrow.

## 2014-11-24 NOTE — Progress Notes (Signed)
  Radiation Oncology         [phone] ________________________________  Stereotactic Treatment Procedure Note  Name: [name] [name] MRN: [id number]  Date: 11/24/2014  DOB: 03/08/1956  SPECIAL TREATMENT PROCEDURE    ICD-9-CM ICD-10-CM   1. Brain metastases 198.3 C79.31     3D TREATMENT PLANNING AND DOSIMETRY:  The patient's radiation plan was reviewed and approved by neurosurgery and radiation oncology prior to treatment.  It showed 3-dimensional radiation distributions overlaid onto the planning CT/MRI image set.  The [facility] for the target structures as well as the organs at risk were reviewed. The documentation of the 3D plan and dosimetry are filed in the radiation oncology EMR.  NARRATIVE:  [name] [name] was brought to the TrueBeam stereotactic radiation treatment machine and placed supine on the CT couch. The head frame was applied, and the patient was set up for stereotactic radiosurgery.  Neurosurgery was present for the set-up and delivery  SIMULATION VERIFICATION:  In the couch zero-angle position, the patient underwent Exactrac imaging using the Brainlab system with orthogonal KV images.  These were carefully aligned and repeated to confirm treatment position for each of the isocenters.  The Exactrac snap film verification was repeated at each couch angle.  SPECIAL TREATMENT PROCEDURE: [name] received stereotactic radiosurgery to the following targets: Right parietal 3.3 cm target was treated using 4 Dynamic Conformal Arcs to a prescription dose of 15 Gy.  ExacTrac registration was performed for each couch angle.  The 83.3% isodose line was prescribed.  6 MV X-rays were delivered in the flattening filter free beam mode. Left cerebellar 3 cm target was treated using 3 Dynamic Conformal Arcs to a prescription dose of 18 Gy.  ExacTrac registration was performed for each couch angle.  The 82.8% isodose line was prescribed.  6 MV X-rays were delivered in the flattening filter free  beam mode.  STEREOTACTIC TREATMENT MANAGEMENT:  Following delivery, the patient was transported to nursing in stable condition and monitored for possible acute effects.  Vital signs were recorded BP 132/82 mmHg  Pulse 65  Temp(Src) 98.2 F (36.8 C) (Axillary)  Resp 16. The patient tolerated treatment without significant acute effects, and was discharged to home in stable condition.    PLAN: Follow-up in one month.  ________________________________  Sheral Apley. Tammi Klippel, M.D.

## 2014-11-24 NOTE — Progress Notes (Signed)
1.14.16:  Completed FMLA paperwork rec'd from physician.  Fax to Regan Rakers, [employer] M[employer] @ [place] [phone].  Copy made for scanning, original mailed to patient's spouse.

## 2014-11-25 ENCOUNTER — Encounter (HOSPITAL_COMMUNITY): Payer: Self-pay | Admitting: *Deleted

## 2014-11-25 ENCOUNTER — Ambulatory Visit (HOSPITAL_COMMUNITY)
Admission: RE | Admit: 2014-11-25 | Discharge: 2014-11-25 | Disposition: A | Discharge status: Home / Self Care | Payer: BLUE CROSS/BLUE SHIELD | Source: Ambulatory Visit | Attending: Neurosurgery | Admitting: Neurosurgery

## 2014-11-25 DIAGNOSIS — C3412 Malignant neoplasm of upper lobe, left bronchus or lung: Secondary | ICD-10-CM | POA: Insufficient documentation

## 2014-11-25 DIAGNOSIS — C7931 Secondary malignant neoplasm of brain: Secondary | ICD-10-CM | POA: Insufficient documentation

## 2014-11-25 DIAGNOSIS — Z510 Encounter for antineoplastic radiation therapy: Secondary | ICD-10-CM | POA: Diagnosis not present

## 2014-11-25 DIAGNOSIS — E780 Pure hypercholesterolemia: Secondary | ICD-10-CM | POA: Insufficient documentation

## 2014-11-25 DIAGNOSIS — Z79899 Other long term (current) drug therapy: Secondary | ICD-10-CM | POA: Insufficient documentation

## 2014-11-25 DIAGNOSIS — Z87891 Personal history of nicotine dependence: Secondary | ICD-10-CM | POA: Insufficient documentation

## 2014-11-25 DIAGNOSIS — I10 Essential (primary) hypertension: Secondary | ICD-10-CM | POA: Insufficient documentation

## 2014-11-25 MED ORDER — LACTATED RINGERS IV SOLN
INTRAVENOUS | Status: DC
  Administered 2014-11-25: 12:00:00 via INTRAVENOUS

## 2014-11-25 NOTE — Pre-Procedure Instructions (Signed)
MOSS BERRY  11/25/2014   Your procedure is scheduled on:  Monday, November 28, 2014 at 7:30 AM.  Report to [facility] Admitting at 5:30 AM.  Call this number if you have problems the morning of surgery: [phone]   Remember:   Do not eat food or drink liquids after midnight.   Take these medicines the morning of surgery with A SIP OF WATER: Alprazolam (Xanax) if needed, inhalers   Do not wear jewelry  Do not wear lotions, powders, or cologne, deodorant.  Men may shave face and neck.  Do not bring valuables to the hospital.  [facility] is not responsible  for any belongings or valuables.               Contacts, dentures or bridgework may not be worn into surgery.  Leave suitcase in the car. After surgery it may be brought to your room.  For patients admitted to the hospital, discharge time is determined by your  treatment team.          Please read over the following fact sheets that you were given: Pain Booklet, Coughing and Deep Breathing, Blood Transfusion Information, MRSA Information and Surgical Site Infection Prevention  [place] - Preparing for Surgery  Before surgery, you can play an important role.  Because skin is not sterile, your skin needs to be as free of germs as possible.  You can reduce the number of germs on you skin by washing with CHG (chlorahexidine gluconate) soap before surgery.  CHG is an antiseptic cleaner which kills germs and bonds with the skin to continue killing germs even after washing.  Please DO NOT use if you have an allergy to CHG or antibacterial soaps.  If your skin becomes reddened/irritated stop using the CHG and inform your nurse when you arrive at Short Stay.  Do not shave (including legs and underarms) for at least 48 hours prior to the first CHG shower.  You may shave your face.  Please follow these instructions carefully:   1.  Shower with CHG Soap the night before surgery and the morning of Surgery.  2.  If you choose  to wash your hair, wash your hair first as usual with your normal shampoo.  3.  After you shampoo, rinse your hair and body thoroughly to remove the shampoo.  4.  Use CHG as you would any other liquid soap.  You can apply CHG directly to the skin and wash gently with scrungie or a clean washcloth.  5.  Apply the CHG Soap to your body ONLY FROM THE NECK DOWN.  Do not use on open wounds or open sores.  Avoid contact with your eyes, ears, mouth and genitals (private parts).  Wash genitals (private parts) with your normal soap.  6.  Wash thoroughly, paying special attention to the area where your surgery will be performed.  7.  Thoroughly rinse your body with warm water from the neck down.  8.  DO NOT shower/wash with your normal soap after using and rinsing off the CHG Soap.  9.  Pat yourself dry with a clean towel.            10.  Wear clean pajamas.            11.  Place clean sheets on your bed the night of your first shower and do not sleep with pets.  Day of Surgery  Do not apply any lotions/deoderants the morning of surgery.  Please wear clean clothes to the hospital/surgery center.

## 2014-11-25 NOTE — H&P (Signed)
Name: [name] Glassco MoodyMRN: [id number] Date: 1/11/2016DOB: Aug 13, 1956  JS:[id number],OHYWV L, MD Curt Bears, MD   REFERRING PHYSICIAN: Curt Bears, MD/ Tyler Pita, MD  DIAGNOSIS: The encounter diagnosis was Brain metastases.    ICD-9-CM ICD-10-CM   1. Brain metastases 198.3 C79.31     HISTORY OF PRESENT ILLNESS::[name] [name] is a 59 y.o. male who was found to have a left upper lung mass in 2012. 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 The patient underwent 3 cycles of chemotherapy followed by left upper lung wedge resection as well as wedge resection of the left lower lobe and lymph node dissection. This showed minimal viable tumor remaining. The patient had a recurrent lung nodule requiring repeat thoracotomy in June 2014. At this point, PET scan suggested lymph node involvement. He underwent wedge resection of the left lower lung nodule confirming non-small cell cancer and this was followed by left lower lobectomy and lymph node dissection. At this point, the patient had adenocarcinoma with lymph node involvement. The patient was given chemotherapy in the adjuvant setting completed in August 2014. He recently developed headaches and nausea prompting head CT which showed 2 brain metastases. The patient had follow-up MRI for further evaluation and this confirmed the presence of 2 brain metastases. Skeleton referred today for consideration of possible radiation treatment options.  The patient's case was discussed in our coordinated brain tumor conference and it was elected to proceed with preoperative SRS followed by surgical removal of these two large metastatic deposits.  PREVIOUS RADIATION THERAPY: No  PAST MEDICAL HISTORY:  has a past medical history of Hypercholesteremia; HTN (hypertension); Shortness of breath; Insomnia; Lung cancer (dx'd 2012); and Brain cancer.   PAST SURGICAL  HISTORY: Past Surgical History  Procedure Laterality Date  . Left video-assisted thoracoscopy, mini thoracotomy; wedge, posterior segment of left upper lobe; wedge, anterior segment of left lower lobe with node dissection.  09/02/11    [facility]  . Video assisted thoracoscopy Left 05/03/2013    Procedure: REDO VIDEO ASSISTED THORACOSCOPY; Surgeon: Melrose Nakayama, MD; Location: [place]; Service: Thoracic; Laterality: Left; REDO LEFT VATS  . Thoracotomy Left 05/03/2013    Procedure: THORACOTOMY MAJOR; Surgeon: Melrose Nakayama, MD; Location: [place]; Service: Thoracic; Laterality: Left;  . Lobectomy Left 05/03/2013    Procedure: LOBECTOMY; Surgeon: Melrose Nakayama, MD; Location: [place]; Service: Thoracic; Laterality: Left;  . Wedge resection Left 05/03/2013    Procedure: WEDGE RESECTION; Surgeon: Melrose Nakayama, MD; Location: [place]; Service: Thoracic; Laterality: Left;  . Open reduction internal fixation (orif) tibia/fibula fracture Right 07/01/2013    Procedure: OPEN REDUCTION INTERNAL FIXATION TIBIAL FRACTURE; Surgeon: Johnn Hai, MD; Location: [facility]; Service: Orthopedics; Laterality: Right;    FAMILY HISTORY: family history includes Cancer - Other in his mother.  SOCIAL HISTORY:  reports that he quit smoking about 18 months ago. His smoking use included Cigarettes. He has a 80 pack-year smoking history. He has quit using smokeless tobacco. His smokeless tobacco use included Chew. He reports that he drinks alcohol. He reports that he does not use illicit drugs.  ALLERGIES: Review of patient's allergies indicates no known allergies.  MEDICATIONS:  Current Outpatient Prescriptions  Medication Sig Dispense Refill  . albuterol (PROVENTIL HFA;VENTOLIN HFA) 108 (90 BASE) MCG/ACT inhaler Inhale 1-2 puffs into the lungs every 4 (four) hours as needed for wheezing.    Marland Kitchen albuterol (PROVENTIL) (2.5 MG/3ML)  0.083% nebulizer solution Take 3 mLs (2.5 mg total) by nebulization every  4 (four) hours as needed for wheezing or shortness of breath. 75 mL 12  . ALPRAZolam (XANAX) 0.25 MG tablet Take 1 tablet (0.25 mg total) by mouth 3 (three) times daily as needed for anxiety. 30 tablet 0  . dexamethasone (DECADRON) 4 MG tablet Take 1 tablet (4 mg total) by mouth 3 (three) times daily. 45 tablet 0  . lisinopril-hydrochlorothiazide (PRINZIDE,ZESTORETIC) 20-25 MG per tablet Take 1 tablet by mouth every morning.     . pravastatin (PRAVACHOL) 40 MG tablet Take 40 mg by mouth at bedtime.     . Tiotropium Bromide-Olodaterol (STIOLTO RESPIMAT) 2.5-2.5 MCG/ACT AERS Inhale 2 puffs into the lungs daily. 4 g 5  . aspirin EC 81 MG tablet Take 81 mg by mouth every morning.    . benzonatate (TESSALON PERLES) 100 MG capsule Take 1 capsule (100 mg total) by mouth 3 (three) times daily as needed for cough. (Patient not taking: Reported on 11/21/2014) 30 capsule 0   No current facility-administered medications for this encounter.    REVIEW OF SYSTEMS: A 15 point review of systems is documented in the electronic medical record. This was obtained by the nursing staff. However, I reviewed this with the patient to discuss relevant findings and make appropriate changes. Pertinent items are noted in HPI.  PHYSICAL EXAM:  height is 6\' 1"  (1.854 m) and weight is 257 lb 3.2 oz (116.665 kg). His blood pressure is 124/85 and his pulse is 89. His respiration is 16 and oxygen saturation is 100%.   The patient is in no acute distress today. Is alert and oriented. Motor strength is intact throughout. Mood and affect are appropriate. Speech is fluent articulate. Gait is steady.  KPS = 90  100 - Normal; no complaints; no evidence of disease. 90 - Able to carry on normal activity; minor signs or symptoms of disease. 80 - Normal activity with effort; some signs or symptoms of disease. 29 -  Cares for self; unable to carry on normal activity or to do active work. 60 - Requires occasional assistance, but is able to care for most of his personal needs. 50 - Requires considerable assistance and frequent medical care. 38 - Disabled; requires special care and assistance. 34 - Severely disabled; hospital admission is indicated although death not imminent. 5 - Very sick; hospital admission necessary; active supportive treatment necessary. 10 - Moribund; fatal processes progressing rapidly. 0 - Dead  Karnofsky DA, Abelmann [place], Craver LS and Burchenal [facility] [phone]) The use of the nitrogen mustards in the palliative treatment of carcinoma: with particular reference to bronchogenic carcinoma Cancer 1 634-56  LABORATORY DATA:   Recent Labs    Lab Results  Component Value Date   WBC 17.4* 11/02/2014   HGB 12.6* 11/02/2014   HCT 38.2* 11/02/2014   MCV 81.3 11/02/2014   PLT 274 11/02/2014      Recent Labs    Lab Results  Component Value Date   NA 140 11/02/2014   K 4.7 11/02/2014   CL 102 09/21/2013   CO2 23 11/02/2014      Recent Labs    Lab Results  Component Value Date   ALT 27 11/02/2014   AST 19 11/02/2014   ALKPHOS 87 11/02/2014   BILITOT 0.39 11/02/2014       Imaging Results    RADIOGRAPHY: Ct Head W Wo Contrast  11/09/2014 CLINICAL DATA: Left-sided headaches with nausea and vomiting. Personal history of lung cancer. EXAM: CT HEAD WITHOUT AND WITH CONTRAST TECHNIQUE: Contiguous axial  images were obtained from the base of the skull through the vertex without and with intravenous contrast CONTRAST: 118mL OMNIPAQUE IOHEXOL 300 MG/ML SOLN COMPARISON: CT head without contrast 07/02/2013. FINDINGS: A peripherally enhancing mass lesion in the left cerebellum measures 3.2 x 2.4 cm. There is significant surrounding vasogenic edema with mass effect on the fourth ventricle. A hyperdense  mass in the posterior right parietal lobe demonstrates some enhancement as well. The lesion measures 3.1 x 2.2 cm. There is surrounding vasogenic edema and mass effect. 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 No other focal metastatic lesions are evident. No acute cortical infarct is present. The ventricles are of normal size. No significant extra-axial fluid collection is present. IMPRESSION: 1. Hyperdense peripherally enhancing mass lesion in the left cerebellum measures 3.2 x 2.4 cm, compatible with a focal metastasis, potentially hemorrhagic. 2. Hyperdense mass lesion in the posterior right parietal lobe measures 3.1 x 2.2 cm. This may be a hyperdense lesion although some hemorrhage is suspected as well. 3. Prominent vasogenic edema is associated with both lesions. There is some mass effect on the fourth ventricle without hydrocephalus. Electronically Signed By: Lawrence Santiago M.D. On: 11/09/2014 11:21   Ct Chest W Contrast  11/02/2014 CLINICAL DATA: 59 year old male with history of lung cancer diagnosed in 2012 with recurrence diagnosed in 2013 status post surgical resection x2. Chemotherapy completed in 2014. EXAM: CT CHEST WITH CONTRAST TECHNIQUE: Multidetector CT imaging of the chest was performed during intravenous contrast administration. CONTRAST: 68mL OMNIPAQUE IOHEXOL 300 MG/ML SOLN COMPARISON: Chest CT 06/23/2014. FINDINGS: Mediastinum: Heart size is normal. There is no significant pericardial fluid, thickening or pericardial calcification. There is atherosclerosis of the thoracic aorta, the great vessels of the mediastinum and the coronary arteries, including calcified atherosclerotic plaque in the left main, left anterior descending, left circumflex and right coronary arteries. No pathologically enlarged mediastinal or hilar lymph nodes. Esophagus is unremarkable in appearance. Lungs/Pleura: Postoperative  changes of left lower lobectomy and left upper lobe wedge resection are again noted. Architectural distortion in the perihilar aspect of the left lung is unchanged, compatible with mild postoperative scarring. Again noted are multiple ill-defined ground-glass attenuation nodules throughout the right lung, largest of which measures 1.3 cm in the right upper lobe (image 26 of series 5). These appear very similar to prior examinations dating back to 03/25/2014. 4 mm right lower lobe nodule (image 46 of series 5) is also unchanged dating back to 05/20/2011, and can be considered benign requiring no imaging followup. No acute consolidative airspace disease. No pleural effusions. Upper Abdomen: Unremarkable. Musculoskeletal: There are no aggressive appearing lytic or blastic lesions noted in the visualized portions of the skeleton. Postthoracotomy changes in the left hemithorax redemonstrated. IMPRESSION: 1. Today's study demonstrates stable postoperative findings in the lungs, without evidence to suggest local recurrence of disease or new metastatic disease. 2. Multiple previously noted ground-glass attenuation nodules scattered throughout the right lung appears similar to recent prior examinations. Their persistence over time remains concerning, and continued attention on future followup studies is recommended. At this time, none of these demonstrate a central solid component. 3. Atherosclerosis, including left main and 3 vessel coronary artery disease. Please note that although the presence of coronary artery calcium documents the presence of coronary artery disease, the severity of this disease and any potential stenosis cannot be assessed on this non-gated CT examination. Assessment for potential risk factor modification, dietary therapy or pharmacologic therapy may be warranted, if clinically  indicated. Electronically Signed By: Vinnie Langton M.D. On: 11/02/2014 14:01   [name] [name] WC  Contrast  11/18/2014 CLINICAL DATA: New diagnosis metastatic disease from known lung cancer. S RS targeting. EXAM: MRI HEAD WITHOUT AND WITH CONTRAST TECHNIQUE: Multiplanar, multiecho pulse sequences of the brain and surrounding structures were obtained without and with intravenous contrast. CONTRAST: 43mL MULTIHANCE GADOBENATE DIMEGLUMINE 529 MG/ML IV SOLN COMPARISON: Head CT 11/09/2014. MRI 06/06/2011. FINDINGS: Within the peripheral left cerebellar hemisphere, there is a centrally necrotic mass measuring 3 x 2.4 by 2.4 cm consistent with metastatic disease. No other cerebellar mass. Within the right parieto-occipital peripheral brain, there is a centrally necrotic mass measuring 3.3 x 2.5 by 3.2 cm consistent with a metastasis. No other lesion is identified. There is an incidental venous angioma in the left frontal lobe. There is no brain infarction. No hydrocephalus. No extra-axial collection. No visible skull or skullbase metastatic lesion. IMPRESSION: Two large metastases showing necrosis, 1 in the peripheral left cerebellum measuring 3 x 2.4 x 2.4 cm and the other within the peripheral right parieto-occipital region measuring 3.3 x 2.5 x 3.2 cm. Both are associated with vasogenic edema but there is no significant mass effect/shift. Electronically Signed By: Nelson Chimes M.D. On: 11/18/2014 16:46      IMPRESSION: This patient is a very nice 59 yo man with two brain metastases. The patient would benefit from surgical resection of his brain metastases.* In addition, the patient would potentially benefit from radiotherapy.* The options include whole brain irradiation versus stereotactic radiosurgery. There are pros and cons associated with each of these potential treatment options. Whole brain radiotherapy would treat the known metastatic deposits and help provide some reduction of risk for future brain metastases. However, whole brain radiotherapy carries potential risks including hair  loss, subacute somnolence, and neurocognitive changes including a possible reduction in short-term memory. Whole brain radiotherapy also may carry a lower likelihood of tumor control at the treatment sites because of the low-dose used. Stereotactic radiosurgery carries a higher likelihood for local tumor control at the targeted sites with lower associated risk for neurocognitive changes such as memory loss.* However, the use of stereotactic radiosurgery in this setting may leave the patient at increased risk for new brain metastases elsewhere in the brain as high as 50-60%. Accordingly, patients who receive stereotactic radiosurgery in this setting should undergo ongoing surveillance imaging with brain MRI more frequently in order to identify and treat new small brain metastases before they become symptomatic. Stereotactic radiosurgery does carry some different risks, including a risk of radionecrosis.  The patient's case was discussed in our coordinated brain tumor conference and it was elected to proceed with preoperative SRS followed by surgical removal of these two large metastatic deposits.   PLAN: We reviewed the findings and workup thus far with the patient. We discussed the dilemma regarding whole brain radiotherapy versus stereotactic radiosurgery. We discussed the pros and cons of each. We also discussed the logistics and delivery of each. We reviewed the results associated with each of the treatments described above. The patient seems to understand the treatment options and would like to proceed with stereotactic radiosurgery in conjunction with operative resection of these brain massess.  In terms of timing of the stereotactic radiosurgery, evidence suggests that the risk of radionecrosis and leptomeningeal recurrence is lower when used in the pre-operative setting as opposed to post-operative SRS.*  The patient has undergone SRS Treatment  and now presents for surgical resection.   Peggyann Shoals, MD    *  References:  1: Patchell RA, Tibbs PA, Helene Shoe, [address] RJ, [place], Guy Sandifer JS, [place] B. A randomized trial of surgery in the treatment of single metastases to the brain. [place] Feb 22;322(8):494-500. PubMed PMID: [id number].   2: Patchell RA, Tibbs PA, Regine WF, Jonita Albee, Mohiuddin M, Arrie Eastern, [place], Strandquist, [place] B. Postoperative radiotherapy in the treatment of single metastases to the brain: a randomized trial. JAMA. 1998 Nov 4;280(17):1485-9. PubMed PMID: [id number].   3: Erlene Senters, Wenda Low, Hess KR, Tomie China, Lang FF, Kornguth DG, [place], Swint JM, Shiu AS, Maor MH, [place]. Neurocognition in patients with brain metastases treated with radiosurgery or radiosurgery plus whole-brain irradiation: a randomised controlled trial. Lancet Oncol. 2009 Nov;10(11):1037-44. doi: 10.1016/S1470-2045(09)70263-3. Epub 2009 Oct 2. PubMed PMID: [id number].  4: Weyman Rodney, Estill Dooms, Coralee Pesa, Crocker IR, Lorie Phenix, Charlesetta Garibaldi, Press RH, Tanya Nones, [place], Wait SD, Higinio Plan, Shu HG, [place]. Comparing Preoperative With Postoperative Stereotactic Radiosurgery for Resectable Brain Metastases: A Multi-institutional Analysis. Neurosurgery. 2015 Nov 2. [Epub ahead of print] PubMed PMID: [id number].

## 2014-11-25 NOTE — Progress Notes (Signed)
Dr. Melven Sartorius Nurse informed Nurse that patients surgery would be cancelled today and rescheduled for Monday at 0730. Nurse instructed patient to remain NPO after midnight Sunday night. A new DOS instruction sheet was reviewed and given to patient. CHG soap also given to patient. Patient preparing for discharge.

## 2014-11-28 ENCOUNTER — Encounter (HOSPITAL_COMMUNITY): Payer: Self-pay | Admitting: Certified Registered Nurse Anesthetist

## 2014-11-28 ENCOUNTER — Inpatient Hospital Stay (HOSPITAL_COMMUNITY): Payer: BLUE CROSS/BLUE SHIELD | Admitting: Certified Registered Nurse Anesthetist

## 2014-11-28 ENCOUNTER — Encounter (HOSPITAL_COMMUNITY)
Admission: RE | Disposition: A | Discharge status: Home / Self Care | Payer: Self-pay | Source: Ambulatory Visit | Attending: Neurosurgery

## 2014-11-28 ENCOUNTER — Inpatient Hospital Stay (HOSPITAL_COMMUNITY): Payer: BLUE CROSS/BLUE SHIELD

## 2014-11-28 ENCOUNTER — Inpatient Hospital Stay (HOSPITAL_COMMUNITY)
Admission: RE | Admit: 2014-11-28 | Discharge: 2014-12-01 | DRG: 026 | Disposition: A | Discharge status: Home / Self Care | Payer: BLUE CROSS/BLUE SHIELD | Source: Ambulatory Visit | Attending: Neurosurgery | Admitting: Neurosurgery

## 2014-11-28 DIAGNOSIS — Z809 Family history of malignant neoplasm, unspecified: Secondary | ICD-10-CM

## 2014-11-28 DIAGNOSIS — J449 Chronic obstructive pulmonary disease, unspecified: Secondary | ICD-10-CM | POA: Diagnosis present

## 2014-11-28 DIAGNOSIS — Z7951 Long term (current) use of inhaled steroids: Secondary | ICD-10-CM | POA: Diagnosis not present

## 2014-11-28 DIAGNOSIS — C3490 Malignant neoplasm of unspecified part of unspecified bronchus or lung: Secondary | ICD-10-CM | POA: Diagnosis present

## 2014-11-28 DIAGNOSIS — C7931 Secondary malignant neoplasm of brain: Secondary | ICD-10-CM | POA: Diagnosis present

## 2014-11-28 DIAGNOSIS — F419 Anxiety disorder, unspecified: Secondary | ICD-10-CM | POA: Diagnosis present

## 2014-11-28 DIAGNOSIS — Z6833 Body mass index (BMI) 33.0-33.9, adult: Secondary | ICD-10-CM

## 2014-11-28 DIAGNOSIS — E669 Obesity, unspecified: Secondary | ICD-10-CM | POA: Diagnosis present

## 2014-11-28 DIAGNOSIS — I10 Essential (primary) hypertension: Secondary | ICD-10-CM | POA: Diagnosis present

## 2014-11-28 DIAGNOSIS — Z7982 Long term (current) use of aspirin: Secondary | ICD-10-CM

## 2014-11-28 DIAGNOSIS — E780 Pure hypercholesterolemia: Secondary | ICD-10-CM | POA: Diagnosis present

## 2014-11-28 DIAGNOSIS — Z87891 Personal history of nicotine dependence: Secondary | ICD-10-CM

## 2014-11-28 DIAGNOSIS — Z452 Encounter for adjustment and management of vascular access device: Secondary | ICD-10-CM

## 2014-11-28 HISTORY — PX: CRANIOTOMY: SHX93

## 2014-11-28 LAB — MRSA PCR SCREENING: MRSA by PCR: NEGATIVE

## 2014-11-28 SURGERY — CRANIOTOMY TUMOR EXCISION
Anesthesia: General

## 2014-11-28 MED ORDER — SENNA 8.6 MG PO TABS
1.0000 | ORAL_TABLET | Freq: Two times a day (BID) | ORAL | Status: DC
  Administered 2014-11-28 – 2014-12-01 (×6): 8.6 mg via ORAL
  Filled 2014-11-28 (×8): qty 1

## 2014-11-28 MED ORDER — THROMBIN 5000 UNITS EX SOLR
OROMUCOSAL | Status: DC | PRN
  Administered 2014-11-28: 10:00:00 via TOPICAL

## 2014-11-28 MED ORDER — LEVETIRACETAM IN NACL 500 MG/100ML IV SOLN
500.0000 mg | Freq: Two times a day (BID) | INTRAVENOUS | Status: DC
  Administered 2014-11-28 – 2014-11-30 (×4): 500 mg via INTRAVENOUS
  Filled 2014-11-28 (×5): qty 100

## 2014-11-28 MED ORDER — PROPOFOL 10 MG/ML IV BOLUS
INTRAVENOUS | Status: AC
  Filled 2014-11-28: qty 20

## 2014-11-28 MED ORDER — BACITRACIN ZINC 500 UNIT/GM EX OINT
TOPICAL_OINTMENT | CUTANEOUS | Status: DC | PRN
  Administered 2014-11-28 (×2): 1 via TOPICAL

## 2014-11-28 MED ORDER — ONDANSETRON HCL 4 MG PO TABS
4.0000 mg | ORAL_TABLET | ORAL | Status: DC | PRN
  Filled 2014-11-28: qty 1

## 2014-11-28 MED ORDER — POTASSIUM CHLORIDE IN NACL 20-0.9 MEQ/L-% IV SOLN
INTRAVENOUS | Status: DC
  Administered 2014-11-28 – 2014-11-29 (×3): via INTRAVENOUS
  Administered 2014-11-30: 75 mL/h via INTRAVENOUS
  Filled 2014-11-28 (×4): qty 1000

## 2014-11-28 MED ORDER — STERILE WATER FOR INJECTION IJ SOLN
INTRAMUSCULAR | Status: AC
  Filled 2014-11-28: qty 10

## 2014-11-28 MED ORDER — ONDANSETRON HCL 4 MG/2ML IJ SOLN
4.0000 mg | INTRAMUSCULAR | Status: DC | PRN
  Filled 2014-11-28: qty 2

## 2014-11-28 MED ORDER — SODIUM CHLORIDE 0.9 % IV SOLN
INTRAVENOUS | Status: DC | PRN
  Administered 2014-11-28: 09:00:00 via INTRAVENOUS

## 2014-11-28 MED ORDER — ALBUTEROL SULFATE HFA 108 (90 BASE) MCG/ACT IN AERS: 1.0000 | INHALATION_SPRAY | RESPIRATORY_TRACT | Status: DC | PRN

## 2014-11-28 MED ORDER — LABETALOL HCL 5 MG/ML IV SOLN
INTRAVENOUS | Status: DC | PRN
  Administered 2014-11-28 (×4): 5 mg via INTRAVENOUS

## 2014-11-28 MED ORDER — MIDAZOLAM HCL 2 MG/2ML IJ SOLN
INTRAMUSCULAR | Status: AC
  Filled 2014-11-28: qty 2

## 2014-11-28 MED ORDER — FENTANYL CITRATE 0.05 MG/ML IJ SOLN
INTRAMUSCULAR | Status: AC
  Filled 2014-11-28: qty 5

## 2014-11-28 MED ORDER — ROCURONIUM BROMIDE 50 MG/5ML IV SOLN
INTRAVENOUS | Status: AC
Start: 2014-11-28 — End: 2014-11-28
  Filled 2014-11-28: qty 2

## 2014-11-28 MED ORDER — DEXAMETHASONE 4 MG PO TABS: 4.0000 mg | ORAL_TABLET | Freq: Three times a day (TID) | ORAL | Status: DC

## 2014-11-28 MED ORDER — ESMOLOL HCL 10 MG/ML IV SOLN
INTRAVENOUS | Status: DC | PRN
  Administered 2014-11-28: 20 mg via INTRAVENOUS
  Administered 2014-11-28: 10 mg via INTRAVENOUS
  Administered 2014-11-28: 30 mg via INTRAVENOUS
  Administered 2014-11-28 (×2): 20 mg via INTRAVENOUS

## 2014-11-28 MED ORDER — LISINOPRIL-HYDROCHLOROTHIAZIDE 20-25 MG PO TABS: 1.0000 | ORAL_TABLET | Freq: Every morning | ORAL | Status: DC

## 2014-11-28 MED ORDER — HYDROCHLOROTHIAZIDE 25 MG PO TABS
25.0000 mg | ORAL_TABLET | Freq: Every day | ORAL | Status: DC
  Administered 2014-11-28 – 2014-12-01 (×4): 25 mg via ORAL
  Filled 2014-11-28 (×5): qty 1

## 2014-11-28 MED ORDER — ALBUTEROL SULFATE (2.5 MG/3ML) 0.083% IN NEBU
2.5000 mg | INHALATION_SOLUTION | RESPIRATORY_TRACT | Status: DC | PRN
  Administered 2014-11-29: 2.5 mg via RESPIRATORY_TRACT
  Filled 2014-11-28: qty 3

## 2014-11-28 MED ORDER — VECURONIUM BROMIDE 10 MG IV SOLR
INTRAVENOUS | Status: AC
  Filled 2014-11-28: qty 10

## 2014-11-28 MED ORDER — MORPHINE SULFATE 2 MG/ML IJ SOLN
1.0000 mg | INTRAMUSCULAR | Status: DC | PRN
  Administered 2014-11-28 – 2014-12-01 (×20): 2 mg via INTRAVENOUS
  Filled 2014-11-28 (×20): qty 1

## 2014-11-28 MED ORDER — CEFAZOLIN SODIUM-DEXTROSE 2-3 GM-% IV SOLR
INTRAVENOUS | Status: DC | PRN
  Administered 2014-11-28: 2 g via INTRAVENOUS

## 2014-11-28 MED ORDER — ONDANSETRON HCL 4 MG/2ML IJ SOLN: 4.0000 mg | Freq: Four times a day (QID) | INTRAMUSCULAR | Status: DC | PRN

## 2014-11-28 MED ORDER — ACETAMINOPHEN 650 MG RE SUPP: 650.0000 mg | RECTAL | Status: DC | PRN

## 2014-11-28 MED ORDER — DEXAMETHASONE SODIUM PHOSPHATE 4 MG/ML IJ SOLN
4.0000 mg | Freq: Three times a day (TID) | INTRAMUSCULAR | Status: DC
  Administered 2014-11-30 – 2014-12-01 (×3): 4 mg via INTRAVENOUS
  Filled 2014-11-28 (×6): qty 1

## 2014-11-28 MED ORDER — ONDANSETRON HCL 4 MG/2ML IJ SOLN
INTRAMUSCULAR | Status: DC | PRN
  Administered 2014-11-28: 4 mg via INTRAVENOUS

## 2014-11-28 MED ORDER — LIDOCAINE-EPINEPHRINE 1 %-1:100000 IJ SOLN
INTRAMUSCULAR | Status: DC | PRN
  Administered 2014-11-28: 10 mL

## 2014-11-28 MED ORDER — LIDOCAINE HCL (CARDIAC) 20 MG/ML IV SOLN
INTRAVENOUS | Status: DC | PRN
  Administered 2014-11-28: 40 mg via INTRAVENOUS

## 2014-11-28 MED ORDER — MICROFIBRILLAR COLL HEMOSTAT EX PADS
MEDICATED_PAD | CUTANEOUS | Status: DC | PRN
  Administered 2014-11-28: 1 via TOPICAL

## 2014-11-28 MED ORDER — DEXAMETHASONE SODIUM PHOSPHATE 10 MG/ML IJ SOLN
6.0000 mg | Freq: Four times a day (QID) | INTRAMUSCULAR | Status: AC
  Administered 2014-11-28 – 2014-11-29 (×4): 6 mg via INTRAVENOUS
  Filled 2014-11-28 (×4): qty 1

## 2014-11-28 MED ORDER — BUPIVACAINE HCL (PF) 0.5 % IJ SOLN
INTRAMUSCULAR | Status: DC | PRN
  Administered 2014-11-28: 10 mL

## 2014-11-28 MED ORDER — ESMOLOL HCL 10 MG/ML IV SOLN
INTRAVENOUS | Status: AC
  Filled 2014-11-28: qty 10

## 2014-11-28 MED ORDER — VECURONIUM BROMIDE 10 MG IV SOLR
INTRAVENOUS | Status: DC | PRN
  Administered 2014-11-28: 1 mg via INTRAVENOUS

## 2014-11-28 MED ORDER — SODIUM CHLORIDE 0.9 % IV SOLN
INTRAVENOUS | Status: DC | PRN
  Administered 2014-11-28: 07:00:00 via INTRAVENOUS

## 2014-11-28 MED ORDER — LORAZEPAM 1 MG PO TABS: 1.0000 mg | ORAL_TABLET | Freq: Every day | ORAL | Status: DC | PRN

## 2014-11-28 MED ORDER — NEOSTIGMINE METHYLSULFATE 10 MG/10ML IV SOLN
INTRAVENOUS | Status: AC
  Filled 2014-11-28: qty 1

## 2014-11-28 MED ORDER — GLYCOPYRROLATE 0.2 MG/ML IJ SOLN
INTRAMUSCULAR | Status: DC | PRN
  Administered 2014-11-28: 0.6 mg via INTRAVENOUS

## 2014-11-28 MED ORDER — POLYETHYLENE GLYCOL 3350 17 G PO PACK
17.0000 g | PACK | Freq: Every day | ORAL | Status: DC | PRN
Start: 2014-11-28 — End: 2014-12-01
  Filled 2014-11-28: qty 1

## 2014-11-28 MED ORDER — PANTOPRAZOLE SODIUM 40 MG IV SOLR
40.0000 mg | Freq: Every day | INTRAVENOUS | Status: DC
  Administered 2014-11-28: 40 mg via INTRAVENOUS
  Filled 2014-11-28 (×2): qty 40

## 2014-11-28 MED ORDER — PROPOFOL 10 MG/ML IV BOLUS
INTRAVENOUS | Status: DC | PRN
  Administered 2014-11-28: 50 mg via INTRAVENOUS
  Administered 2014-11-28: 200 mg via INTRAVENOUS
  Administered 2014-11-28: 150 mg via INTRAVENOUS

## 2014-11-28 MED ORDER — FENTANYL CITRATE 0.05 MG/ML IJ SOLN
INTRAMUSCULAR | Status: DC | PRN
  Administered 2014-11-28 (×6): 50 ug via INTRAVENOUS
  Administered 2014-11-28: 150 ug via INTRAVENOUS
  Administered 2014-11-28: 100 ug via INTRAVENOUS
  Administered 2014-11-28 (×2): 50 ug via INTRAVENOUS
  Administered 2014-11-28: 150 ug via INTRAVENOUS
  Administered 2014-11-28 (×2): 100 ug via INTRAVENOUS

## 2014-11-28 MED ORDER — BENZONATATE 100 MG PO CAPS
100.0000 mg | ORAL_CAPSULE | Freq: Three times a day (TID) | ORAL | Status: DC | PRN
  Filled 2014-11-28: qty 1

## 2014-11-28 MED ORDER — GLYCOPYRROLATE 0.2 MG/ML IJ SOLN
INTRAMUSCULAR | Status: AC
  Filled 2014-11-28: qty 3

## 2014-11-28 MED ORDER — TIOTROPIUM BROMIDE-OLODATEROL 2.5-2.5 MCG/ACT IN AERS
2.0000 | INHALATION_SPRAY | Freq: Every day | RESPIRATORY_TRACT | Status: DC
  Administered 2014-11-29: 2 via RESPIRATORY_TRACT

## 2014-11-28 MED ORDER — ACETAMINOPHEN 325 MG PO TABS: 650.0000 mg | ORAL_TABLET | ORAL | Status: DC | PRN

## 2014-11-28 MED ORDER — DEXMEDETOMIDINE HCL 200 MCG/2ML IV SOLN
INTRAVENOUS | Status: DC | PRN
  Administered 2014-11-28: 8 ug via INTRAVENOUS
  Administered 2014-11-28 (×3): 4 ug via INTRAVENOUS
  Administered 2014-11-28 (×2): 8 ug via INTRAVENOUS
  Administered 2014-11-28: 4 ug via INTRAVENOUS

## 2014-11-28 MED ORDER — CEFAZOLIN SODIUM-DEXTROSE 2-3 GM-% IV SOLR
2.0000 g | Freq: Three times a day (TID) | INTRAVENOUS | Status: AC
  Administered 2014-11-28 (×2): 2 g via INTRAVENOUS
  Filled 2014-11-28 (×2): qty 50

## 2014-11-28 MED ORDER — DEXAMETHASONE SODIUM PHOSPHATE 10 MG/ML IJ SOLN
INTRAMUSCULAR | Status: AC
  Filled 2014-11-28: qty 1

## 2014-11-28 MED ORDER — FLEET ENEMA 7-19 GM/118ML RE ENEM
1.0000 | ENEMA | Freq: Once | RECTAL | Status: AC | PRN
  Filled 2014-11-28: qty 1

## 2014-11-28 MED ORDER — LISINOPRIL 20 MG PO TABS
20.0000 mg | ORAL_TABLET | Freq: Every day | ORAL | Status: DC
  Administered 2014-11-28 – 2014-12-01 (×4): 20 mg via ORAL
  Filled 2014-11-28 (×5): qty 1

## 2014-11-28 MED ORDER — HYDROCODONE-ACETAMINOPHEN 5-325 MG PO TABS
1.0000 | ORAL_TABLET | ORAL | Status: DC | PRN
  Administered 2014-11-28 – 2014-12-01 (×13): 1 via ORAL
  Filled 2014-11-28 (×13): qty 1

## 2014-11-28 MED ORDER — DEXAMETHASONE SODIUM PHOSPHATE 4 MG/ML IJ SOLN
4.0000 mg | Freq: Four times a day (QID) | INTRAMUSCULAR | Status: AC
  Administered 2014-11-29 – 2014-11-30 (×4): 4 mg via INTRAVENOUS
  Filled 2014-11-28 (×4): qty 1

## 2014-11-28 MED ORDER — NEOSTIGMINE METHYLSULFATE 10 MG/10ML IV SOLN
INTRAVENOUS | Status: DC | PRN
  Administered 2014-11-28: 4 mg via INTRAVENOUS

## 2014-11-28 MED ORDER — LABETALOL HCL 5 MG/ML IV SOLN
INTRAVENOUS | Status: AC
  Filled 2014-11-28: qty 4

## 2014-11-28 MED ORDER — SODIUM CHLORIDE 0.9 % IR SOLN
Status: DC | PRN
  Administered 2014-11-28 (×3): 1000 mL

## 2014-11-28 MED ORDER — PRAVASTATIN SODIUM 40 MG PO TABS
40.0000 mg | ORAL_TABLET | Freq: Every day | ORAL | Status: DC
  Administered 2014-11-28 – 2014-11-30 (×3): 40 mg via ORAL
  Filled 2014-11-28 (×4): qty 1

## 2014-11-28 MED ORDER — DOCUSATE SODIUM 100 MG PO CAPS
100.0000 mg | ORAL_CAPSULE | Freq: Two times a day (BID) | ORAL | Status: DC
  Administered 2014-11-28 – 2014-12-01 (×6): 100 mg via ORAL
  Filled 2014-11-28 (×8): qty 1

## 2014-11-28 MED ORDER — DEXMEDETOMIDINE HCL IN NACL 200 MCG/50ML IV SOLN
INTRAVENOUS | Status: AC
  Filled 2014-11-28: qty 50

## 2014-11-28 MED ORDER — ALPRAZOLAM 0.25 MG PO TABS: 0.2500 mg | ORAL_TABLET | Freq: Three times a day (TID) | ORAL | Status: DC | PRN

## 2014-11-28 MED ORDER — PROMETHAZINE HCL 25 MG PO TABS: 12.5000 mg | ORAL_TABLET | ORAL | Status: DC | PRN

## 2014-11-28 MED ORDER — ROCURONIUM BROMIDE 100 MG/10ML IV SOLN
INTRAVENOUS | Status: DC | PRN
  Administered 2014-11-28 (×2): 50 mg via INTRAVENOUS

## 2014-11-28 MED ORDER — DEXAMETHASONE SODIUM PHOSPHATE 10 MG/ML IJ SOLN
INTRAMUSCULAR | Status: DC | PRN
  Administered 2014-11-28: 10 mg via INTRAVENOUS

## 2014-11-28 MED ORDER — MIDAZOLAM HCL 5 MG/5ML IJ SOLN
INTRAMUSCULAR | Status: DC | PRN
  Administered 2014-11-28 (×2): 1 mg via INTRAVENOUS

## 2014-11-28 MED ORDER — FENTANYL CITRATE 0.05 MG/ML IJ SOLN: 25.0000 ug | INTRAMUSCULAR | Status: DC | PRN

## 2014-11-28 MED ORDER — LABETALOL HCL 5 MG/ML IV SOLN
10.0000 mg | INTRAVENOUS | Status: DC | PRN
Start: 2014-11-28 — End: 2014-12-01

## 2014-11-28 MED ORDER — NITROGLYCERIN IN D5W 200-5 MCG/ML-% IV SOLN
0.0000 ug/min | INTRAVENOUS | Status: AC
  Administered 2014-11-28: 5 ug/min via INTRAVENOUS
  Filled 2014-11-28: qty 250

## 2014-11-28 MED ORDER — THROMBIN 20000 UNITS EX SOLR
CUTANEOUS | Status: DC | PRN
  Administered 2014-11-28: 20 mL via TOPICAL

## 2014-11-28 MED ORDER — BISACODYL 10 MG RE SUPP: 10.0000 mg | Freq: Every day | RECTAL | Status: DC | PRN

## 2014-11-28 SURGICAL SUPPLY — 92 items
BANDAGE GAUZE 4  KLING STR (GAUZE/BANDAGES/DRESSINGS) ×6 IMPLANT
BIT DRILL WIRE PASS 1.3MM (BIT) IMPLANT
BLADE CLIPPER SURG (BLADE) ×3 IMPLANT
BRUSH SCRUB EZ 1% IODOPHOR (MISCELLANEOUS) ×3 IMPLANT
BRUSH SCRUB EZ PLAIN DRY (MISCELLANEOUS) ×3 IMPLANT
BUR ACORN 6.0 PRECISION (BURR) ×2 IMPLANT
BUR ACORN 6.0MM PRECISION (BURR) ×1
BUR ADDG 1.1 (BURR) IMPLANT
BUR ADDG 1.1MM (BURR)
BUR ROUND FLUTED 5 RND (BURR) ×2 IMPLANT
BUR ROUND FLUTED 5MM RND (BURR) ×1
BUR ROUTER D-58 CRANI (BURR) ×3 IMPLANT
CANISTER SUCT 3000ML (MISCELLANEOUS) ×3 IMPLANT
CLIP TI MEDIUM 6 (CLIP) IMPLANT
CONT SPEC 4OZ CLIKSEAL STRL BL (MISCELLANEOUS) ×6 IMPLANT
COVER MAYO STAND STRL (DRAPES) IMPLANT
DECANTER SPIKE VIAL GLASS SM (MISCELLANEOUS) ×3 IMPLANT
DRAIN SNY WOU 7FLT (WOUND CARE) IMPLANT
DRAPE MICROSCOPE LEICA (MISCELLANEOUS) IMPLANT
DRAPE NEUROLOGICAL W/INCISE (DRAPES) ×3 IMPLANT
DRAPE STERI IOBAN 125X83 (DRAPES) IMPLANT
DRAPE WARM FLUID 44X44 (DRAPE) ×3 IMPLANT
DRILL WIRE PASS 1.3MM (BIT)
DRSG OPSITE 4X5.5 SM (GAUZE/BANDAGES/DRESSINGS) ×12 IMPLANT
DRSG TELFA 3X8 NADH (GAUZE/BANDAGES/DRESSINGS) ×3 IMPLANT
DURAMATRIX ONLAY 3X3 (Plate) ×3 IMPLANT
DURAPREP 6ML APPLICATOR 50/CS (WOUND CARE) ×3 IMPLANT
ELECT REM PT RETURN 9FT ADLT (ELECTROSURGICAL) ×3
ELECTRODE REM PT RTRN 9FT ADLT (ELECTROSURGICAL) ×1 IMPLANT
EVACUATOR 1/8 PVC DRAIN (DRAIN) IMPLANT
EVACUATOR SILICONE 100CC (DRAIN) IMPLANT
FORCEPS BIPOLAR SPETZLER 8 1.0 (NEUROSURGERY SUPPLIES) ×3 IMPLANT
GAUZE SPONGE 4X4 12PLY STRL (GAUZE/BANDAGES/DRESSINGS) ×3 IMPLANT
GAUZE SPONGE 4X4 16PLY XRAY LF (GAUZE/BANDAGES/DRESSINGS) IMPLANT
GLOVE BIO SURGEON STRL SZ8 (GLOVE) ×3 IMPLANT
GLOVE BIOGEL PI IND STRL 7.5 (GLOVE) ×4 IMPLANT
GLOVE BIOGEL PI IND STRL 8 (GLOVE) ×1 IMPLANT
GLOVE BIOGEL PI IND STRL 8.5 (GLOVE) ×1 IMPLANT
GLOVE BIOGEL PI INDICATOR 7.5 (GLOVE) ×8
GLOVE BIOGEL PI INDICATOR 8 (GLOVE) ×2
GLOVE BIOGEL PI INDICATOR 8.5 (GLOVE) ×2
GLOVE ECLIPSE 8.0 STRL XLNG CF (GLOVE) ×3 IMPLANT
GLOVE EXAM NITRILE LRG STRL (GLOVE) IMPLANT
GLOVE EXAM NITRILE MD LF STRL (GLOVE) IMPLANT
GLOVE EXAM NITRILE XL STR (GLOVE) IMPLANT
GLOVE EXAM NITRILE XS STR PU (GLOVE) IMPLANT
GOWN STRL REUS W/ TWL LRG LVL3 (GOWN DISPOSABLE) IMPLANT
GOWN STRL REUS W/ TWL XL LVL3 (GOWN DISPOSABLE) ×3 IMPLANT
GOWN STRL REUS W/TWL 2XL LVL3 (GOWN DISPOSABLE) ×3 IMPLANT
GOWN STRL REUS W/TWL LRG LVL3 (GOWN DISPOSABLE)
GOWN STRL REUS W/TWL XL LVL3 (GOWN DISPOSABLE) ×6
HEMOSTAT POWDER SURGIFOAM 1G (HEMOSTASIS) ×3 IMPLANT
HEMOSTAT SURGICEL 2X14 (HEMOSTASIS) ×3 IMPLANT
KIT BASIN OR (CUSTOM PROCEDURE TRAY) ×3 IMPLANT
KIT ROOM TURNOVER OR (KITS) ×3 IMPLANT
MARKER SKIN DUAL TIP RULER LAB (MISCELLANEOUS) ×3 IMPLANT
MARKER SPHERE PSV REFLC 13MM (MARKER) ×12 IMPLANT
NEEDLE HYPO 25X1 1.5 SAFETY (NEEDLE) ×3 IMPLANT
NS IRRIG 1000ML POUR BTL (IV SOLUTION) ×9 IMPLANT
PACK CRANIOTOMY (CUSTOM PROCEDURE TRAY) ×3 IMPLANT
PAD ARMBOARD 7.5X6 YLW CONV (MISCELLANEOUS) ×3 IMPLANT
PAD EYE OVAL STERILE LF (GAUZE/BANDAGES/DRESSINGS) IMPLANT
PATTIES SURGICAL .25X.25 (GAUZE/BANDAGES/DRESSINGS) IMPLANT
PATTIES SURGICAL .5 X.5 (GAUZE/BANDAGES/DRESSINGS) IMPLANT
PATTIES SURGICAL .5 X3 (DISPOSABLE) IMPLANT
PATTIES SURGICAL 1/4 X 3 (GAUZE/BANDAGES/DRESSINGS) IMPLANT
PATTIES SURGICAL 1X1 (DISPOSABLE) IMPLANT
PIN MAYFIELD SKULL DISP (PIN) ×3 IMPLANT
PLATE 1.5 5HOLE XLONG Y (Plate) ×6 IMPLANT
PLATE 1.5/0.5 18.5MM BURR HOLE (Plate) ×12 IMPLANT
RUBBERBAND STERILE (MISCELLANEOUS) IMPLANT
SCREW SELF DRILL HT 1.5/4MM (Screw) ×57 IMPLANT
SPECIMEN JAR SMALL (MISCELLANEOUS) IMPLANT
SPONGE NEURO XRAY DETECT 1X3 (DISPOSABLE) IMPLANT
SPONGE SURGIFOAM ABS GEL 100 (HEMOSTASIS) ×3 IMPLANT
STAPLER SKIN PROX WIDE 3.9 (STAPLE) ×6 IMPLANT
SUT ETHILON 3 0 FSL (SUTURE) IMPLANT
SUT NURALON 4 0 TR CR/8 (SUTURE) ×9 IMPLANT
SUT SILK 2 0 FS (SUTURE) IMPLANT
SUT VIC AB 0 CT1 18XCR BRD8 (SUTURE) ×1 IMPLANT
SUT VIC AB 0 CT1 8-18 (SUTURE) ×2
SUT VIC AB 2-0 CP2 18 (SUTURE) ×6 IMPLANT
SYR CONTROL 10ML LL (SYRINGE) ×3 IMPLANT
TIP SONASTAR STD MISONIX 1.9 (TRAY / TRAY PROCEDURE) IMPLANT
TOWEL OR 17X24 6PK STRL BLUE (TOWEL DISPOSABLE) ×3 IMPLANT
TOWEL OR 17X26 10 PK STRL BLUE (TOWEL DISPOSABLE) ×3 IMPLANT
TRAY FOLEY CATH 14FRSI W/METER (CATHETERS) IMPLANT
TRAY FOLEY CATH 16FRSI W/METER (SET/KITS/TRAYS/PACK) ×3 IMPLANT
TUBE CONNECTING 12'X1/4 (SUCTIONS) ×1
TUBE CONNECTING 12X1/4 (SUCTIONS) ×2 IMPLANT
UNDERPAD 30X30 INCONTINENT (UNDERPADS AND DIAPERS) ×3 IMPLANT
WATER STERILE IRR 1000ML POUR (IV SOLUTION) ×3 IMPLANT

## 2014-11-28 SURGICAL SUPPLY — 78 items
BANDAGE GAUZE 4  KLING STR (GAUZE/BANDAGES/DRESSINGS) ×6 IMPLANT
BIT DRILL WIRE PASS 1.3MM (BIT) IMPLANT
BLADE CLIPPER SURG (BLADE) ×3 IMPLANT
BRUSH SCRUB EZ 1% IODOPHOR (MISCELLANEOUS) ×3 IMPLANT
BRUSH SCRUB EZ PLAIN DRY (MISCELLANEOUS) ×3 IMPLANT
BUR ACORN 6.0 PRECISION (BURR) ×2 IMPLANT
BUR ACORN 6.0MM PRECISION (BURR) ×1
BUR ADDG 1.1 (BURR) IMPLANT
BUR ADDG 1.1MM (BURR)
BUR ROUTER D-58 CRANI (BURR) ×3 IMPLANT
CANISTER SUCT 3000ML (MISCELLANEOUS) ×3 IMPLANT
CLIP TI MEDIUM 6 (CLIP) IMPLANT
CONT SPEC 4OZ CLIKSEAL STRL BL (MISCELLANEOUS) ×6 IMPLANT
COVER MAYO STAND STRL (DRAPES) IMPLANT
DECANTER SPIKE VIAL GLASS SM (MISCELLANEOUS) ×3 IMPLANT
DRAIN SNY WOU 7FLT (WOUND CARE) IMPLANT
DRAPE MICROSCOPE LEICA (MISCELLANEOUS) IMPLANT
DRAPE NEUROLOGICAL W/INCISE (DRAPES) ×3 IMPLANT
DRAPE STERI IOBAN 125X83 (DRAPES) IMPLANT
DRAPE WARM FLUID 44X44 (DRAPE) ×3 IMPLANT
DRILL WIRE PASS 1.3MM (BIT)
DRSG OPSITE 4X5.5 SM (GAUZE/BANDAGES/DRESSINGS) IMPLANT
DRSG TELFA 3X8 NADH (GAUZE/BANDAGES/DRESSINGS) ×3 IMPLANT
DURAPREP 6ML APPLICATOR 50/CS (WOUND CARE) ×3 IMPLANT
ELECT REM PT RETURN 9FT ADLT (ELECTROSURGICAL) ×3
ELECTRODE REM PT RTRN 9FT ADLT (ELECTROSURGICAL) ×1 IMPLANT
EVACUATOR 1/8 PVC DRAIN (DRAIN) IMPLANT
EVACUATOR SILICONE 100CC (DRAIN) IMPLANT
GAUZE SPONGE 4X4 12PLY STRL (GAUZE/BANDAGES/DRESSINGS) ×3 IMPLANT
GAUZE SPONGE 4X4 16PLY XRAY LF (GAUZE/BANDAGES/DRESSINGS) IMPLANT
GLOVE BIO SURGEON STRL SZ8 (GLOVE) ×3 IMPLANT
GLOVE BIOGEL PI IND STRL 8 (GLOVE) ×1 IMPLANT
GLOVE BIOGEL PI IND STRL 8.5 (GLOVE) ×1 IMPLANT
GLOVE BIOGEL PI INDICATOR 8 (GLOVE) ×2
GLOVE BIOGEL PI INDICATOR 8.5 (GLOVE) ×2
GLOVE ECLIPSE 8.0 STRL XLNG CF (GLOVE) ×3 IMPLANT
GLOVE EXAM NITRILE LRG STRL (GLOVE) IMPLANT
GLOVE EXAM NITRILE MD LF STRL (GLOVE) IMPLANT
GLOVE EXAM NITRILE XL STR (GLOVE) IMPLANT
GLOVE EXAM NITRILE XS STR PU (GLOVE) IMPLANT
GOWN STRL REUS W/ TWL LRG LVL3 (GOWN DISPOSABLE) IMPLANT
GOWN STRL REUS W/ TWL XL LVL3 (GOWN DISPOSABLE) IMPLANT
GOWN STRL REUS W/TWL 2XL LVL3 (GOWN DISPOSABLE) IMPLANT
GOWN STRL REUS W/TWL LRG LVL3 (GOWN DISPOSABLE)
GOWN STRL REUS W/TWL XL LVL3 (GOWN DISPOSABLE)
HEMOSTAT SURGICEL 2X14 (HEMOSTASIS) ×3 IMPLANT
KIT BASIN OR (CUSTOM PROCEDURE TRAY) ×3 IMPLANT
KIT ROOM TURNOVER OR (KITS) ×3 IMPLANT
MARKER SKIN DUAL TIP RULER LAB (MISCELLANEOUS) ×3 IMPLANT
NEEDLE HYPO 25X1 1.5 SAFETY (NEEDLE) ×3 IMPLANT
NS IRRIG 1000ML POUR BTL (IV SOLUTION) ×6 IMPLANT
PACK CRANIOTOMY (CUSTOM PROCEDURE TRAY) ×3 IMPLANT
PAD ARMBOARD 7.5X6 YLW CONV (MISCELLANEOUS) ×3 IMPLANT
PAD EYE OVAL STERILE LF (GAUZE/BANDAGES/DRESSINGS) IMPLANT
PATTIES SURGICAL .25X.25 (GAUZE/BANDAGES/DRESSINGS) IMPLANT
PATTIES SURGICAL .5 X.5 (GAUZE/BANDAGES/DRESSINGS) IMPLANT
PATTIES SURGICAL .5 X3 (DISPOSABLE) IMPLANT
PATTIES SURGICAL 1/4 X 3 (GAUZE/BANDAGES/DRESSINGS) IMPLANT
PATTIES SURGICAL 1X1 (DISPOSABLE) IMPLANT
PIN MAYFIELD SKULL DISP (PIN) ×3 IMPLANT
RUBBERBAND STERILE (MISCELLANEOUS) IMPLANT
SPECIMEN JAR SMALL (MISCELLANEOUS) IMPLANT
SPONGE NEURO XRAY DETECT 1X3 (DISPOSABLE) IMPLANT
SPONGE SURGIFOAM ABS GEL 100 (HEMOSTASIS) ×3 IMPLANT
STAPLER SKIN PROX WIDE 3.9 (STAPLE) ×3 IMPLANT
SUT ETHILON 3 0 FSL (SUTURE) IMPLANT
SUT NURALON 4 0 TR CR/8 (SUTURE) ×9 IMPLANT
SUT SILK 2 0 FS (SUTURE) IMPLANT
SUT VIC AB 2-0 CP2 18 (SUTURE) ×6 IMPLANT
SYR CONTROL 10ML LL (SYRINGE) ×3 IMPLANT
TIP SONASTAR STD MISONIX 1.9 (TRAY / TRAY PROCEDURE) IMPLANT
TOWEL OR 17X24 6PK STRL BLUE (TOWEL DISPOSABLE) ×3 IMPLANT
TOWEL OR 17X26 10 PK STRL BLUE (TOWEL DISPOSABLE) ×3 IMPLANT
TRAY FOLEY CATH 14FRSI W/METER (CATHETERS) ×3 IMPLANT
TUBE CONNECTING 12'X1/4 (SUCTIONS) ×1
TUBE CONNECTING 12X1/4 (SUCTIONS) ×2 IMPLANT
UNDERPAD 30X30 INCONTINENT (UNDERPADS AND DIAPERS) ×3 IMPLANT
WATER STERILE IRR 1000ML POUR (IV SOLUTION) ×3 IMPLANT

## 2014-11-28 NOTE — Anesthesia Preprocedure Evaluation (Addendum)
Anesthesia Evaluation  Patient identified by MRN, date of birth, ID band Patient awake    Reviewed: Allergy & Precautions, NPO status , Patient's Chart, lab work & pertinent test results  Airway Mallampati: II  TM Distance: >3 FB Neck ROM: full    Dental  (+) Edentulous Upper, Edentulous Lower   Pulmonary shortness of breath, COPD COPD inhaler, former smoker,  breath sounds clear to auscultation  + decreased breath sounds      Cardiovascular hypertension, Pt. on medications Rhythm:Regular     Neuro/Psych  Headaches, Anxiety    GI/Hepatic negative GI ROS, Neg liver ROS,   Endo/Other  negative endocrine ROSobese  Renal/GU Renal InsufficiencyRenal diseasenegative Renal ROS     Musculoskeletal  (+) Arthritis -,   Abdominal (+) + obese,   Peds  Hematology   Anesthesia Other Findings   Reproductive/Obstetrics                            Anesthesia Physical Anesthesia Plan  ASA: III  Anesthesia Plan: General   Post-op Pain Management:    Induction: Intravenous  Airway Management Planned: Oral ETT  Additional Equipment: Arterial line and CVP  Intra-op Plan:   Post-operative Plan: Extubation in OR and Possible Post-op intubation/ventilation  Informed Consent: I have reviewed the patients History and Physical, chart, labs and discussed the procedure including the risks, benefits and alternatives for the proposed anesthesia with the patient or authorized representative who has indicated his/her understanding and acceptance.   Dental advisory given  Plan Discussed with: Anesthesiologist, Surgeon and CRNA  Anesthesia Plan Comments:        Anesthesia Quick Evaluation

## 2014-11-28 NOTE — Brief Op Note (Signed)
11/28/2014  11:13 AM  PATIENT:  [name] [name]  59 y.o. male  PRE-OPERATIVE DIAGNOSIS:  Left cerebellar and right parietal brain metastasis with lung cancer primary  POST-OPERATIVE DIAGNOSIS:  Left cerebellar and right parietal brain metastasis with lung cancer primary  PROCEDURE:  Procedure(s): CRANIOTOMY TUMOR EXCISION (N/A)  SURGEON:  Surgeon(s) and Role:    * Erline Levine, MD - Primary    * Newman Pies, MD - Assisting  PHYSICIAN ASSISTANT:   ASSISTANTS: Poteat, RN  ANESTHESIA:   general  EBL:  Total I/O In: 1200 [I.V.:1200] Out: 1000 [Urine:700; Blood:300]  BLOOD ADMINISTERED:none  DRAINS: none   LOCAL MEDICATIONS USED:  LIDOCAINE   SPECIMEN:  Excision  DISPOSITION OF SPECIMEN:  PATHOLOGY  COUNTS:  YES  TOURNIQUET:  * No tourniquets in log *  DICTATION: Patient is 2ear old man with a large cerebellar metastatic lung cancer mass and a right parietal metastasis. He received preoperative radiosurgery to these lesiona and preoperative planning with Brain Lab MRI. It was elected to take him to surgery for resection of these masses.  Procedure:  Following smooth intubation, patient was placed in prone position on chest rolls with 3 pin head fixation. Posterior parietal and suboccipital region was shaved and prepped and draped in usual sterile fashion with betadine scrub and Duraprep after BrainLab was used to localize surgery and tumors..  Area of planned incision was infiltrated with lidocaine. A linear incision was made in the suboccipital region to expose calvarium and extended to the parietal region, then limbed to the right. High speed drill was used to perform bilateral suboccipital craniectomy to expose the dura and 4 Bur holes were placed and flap elevated in the right posterior parietal region.  Tumor location was confirmed with BrainLab probe. Dura was opened over left cerebellar hemisphere.  A corticotomy was created in the left cerebellar hemisphere and carried  to the tumor, which was mobilized and resected.  The timor was adherent to the brain and Dura.  The tumor was removed with suction cautery technique and samples were sent to Pathology for evaluation.  The Dura was opened over the right parietal region.  The tumor came to the surface and was removed.  Extent of resection was confirmed with BrainLab probe and with use of operating microscope.  Hemostasis was assured with irrigation and cotton balls.  Hemostasis was assured.  The brain was considerably more relaxed after tumor resection.  The tumorcavity was lined with Surgicell. The dura was patched with Dura-matrix and  the fascia was closed with 0 vicryl sutures, subcutaneous tissues were reapproximated with 2-0 vicryl sutures and the skin was re approximated with  A 3-0 Nylon sutures.  A sterile occlusive dressing was placed.  Patient was returned to a supine position and taken out of pins and extubated in the operating room and taken to Recovery having tolerated his surgery well.  Counts were correct at the end of the case.  PLAN OF CARE: Admit to inpatient   PATIENT DISPOSITION:  PACU - hemodynamically stable.   Delay start of Pharmacological VTE agent (>24hrs) due to surgical blood loss or risk of bleeding: yes

## 2014-11-28 NOTE — H&P (View-Only) (Signed)
Name: [name] Cullens MoodyMRN: [id number] Date: 1/11/2016DOB: May 15, 1956  HB:ZJIRCVE,LFYBO L, MD Curt Bears, MD   REFERRING PHYSICIAN: Curt Bears, MD/ Tyler Pita, MD  DIAGNOSIS: The encounter diagnosis was Brain metastases.    ICD-9-CM ICD-10-CM   1. Brain metastases 198.3 C79.31     HISTORY OF PRESENT ILLNESS::[name] [name] is a 59 y.o. male who was found to have a left upper lung mass in 2012. 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 The patient underwent 3 cycles of chemotherapy followed by left upper lung wedge resection as well as wedge resection of the left lower lobe and lymph node dissection. This showed minimal viable tumor remaining. The patient had a recurrent lung nodule requiring repeat thoracotomy in June 2014. At this point, PET scan suggested lymph node involvement. He underwent wedge resection of the left lower lung nodule confirming non-small cell cancer and this was followed by left lower lobectomy and lymph node dissection. At this point, the patient had adenocarcinoma with lymph node involvement. The patient was given chemotherapy in the adjuvant setting completed in August 2014. He recently developed headaches and nausea prompting head CT which showed 2 brain metastases. The patient had follow-up MRI for further evaluation and this confirmed the presence of 2 brain metastases. Skeleton referred today for consideration of possible radiation treatment options.  The patient's case was discussed in our coordinated brain tumor conference and it was elected to proceed with preoperative SRS followed by surgical removal of these two large metastatic deposits.  PREVIOUS RADIATION THERAPY: No  PAST MEDICAL HISTORY:  has a past medical history of Hypercholesteremia; HTN (hypertension); Shortness of breath; Insomnia; Lung cancer (dx'd 2012); and Brain cancer.   PAST SURGICAL  HISTORY: Past Surgical History  Procedure Laterality Date  . Left video-assisted thoracoscopy, mini thoracotomy; wedge, posterior segment of left upper lobe; wedge, anterior segment of left lower lobe with node dissection.  09/02/11    [facility]  . Video assisted thoracoscopy Left 05/03/2013    Procedure: REDO VIDEO ASSISTED THORACOSCOPY; Surgeon: Melrose Nakayama, MD; Location: [place]; Service: Thoracic; Laterality: Left; REDO LEFT VATS  . Thoracotomy Left 05/03/2013    Procedure: THORACOTOMY MAJOR; Surgeon: Melrose Nakayama, MD; Location: [place]; Service: Thoracic; Laterality: Left;  . Lobectomy Left 05/03/2013    Procedure: LOBECTOMY; Surgeon: Melrose Nakayama, MD; Location: [place]; Service: Thoracic; Laterality: Left;  . Wedge resection Left 05/03/2013    Procedure: WEDGE RESECTION; Surgeon: Melrose Nakayama, MD; Location: [place]; Service: Thoracic; Laterality: Left;  . Open reduction internal fixation (orif) tibia/fibula fracture Right 07/01/2013    Procedure: OPEN REDUCTION INTERNAL FIXATION TIBIAL FRACTURE; Surgeon: Johnn Hai, MD; Location: [facility]; Service: Orthopedics; Laterality: Right;    FAMILY HISTORY: family history includes Cancer - Other in his mother.  SOCIAL HISTORY:  reports that he quit smoking about 18 months ago. His smoking use included Cigarettes. He has a 80 pack-year smoking history. He has quit using smokeless tobacco. His smokeless tobacco use included Chew. He reports that he drinks alcohol. He reports that he does not use illicit drugs.  ALLERGIES: Review of patient's allergies indicates no known allergies.  MEDICATIONS:  Current Outpatient Prescriptions  Medication Sig Dispense Refill  . albuterol (PROVENTIL HFA;VENTOLIN HFA) 108 (90 BASE) MCG/ACT inhaler Inhale 1-2 puffs into the lungs every 4 (four) hours as needed for wheezing.    Marland Kitchen albuterol (PROVENTIL) (2.5 MG/3ML)  0.083% nebulizer solution Take 3 mLs (2.5 mg total) by nebulization every  4 (four) hours as needed for wheezing or shortness of breath. 75 mL 12  . ALPRAZolam (XANAX) 0.25 MG tablet Take 1 tablet (0.25 mg total) by mouth 3 (three) times daily as needed for anxiety. 30 tablet 0  . dexamethasone (DECADRON) 4 MG tablet Take 1 tablet (4 mg total) by mouth 3 (three) times daily. 45 tablet 0  . lisinopril-hydrochlorothiazide (PRINZIDE,ZESTORETIC) 20-25 MG per tablet Take 1 tablet by mouth every morning.     . pravastatin (PRAVACHOL) 40 MG tablet Take 40 mg by mouth at bedtime.     . Tiotropium Bromide-Olodaterol (STIOLTO RESPIMAT) 2.5-2.5 MCG/ACT AERS Inhale 2 puffs into the lungs daily. 4 g 5  . aspirin EC 81 MG tablet Take 81 mg by mouth every morning.    . benzonatate (TESSALON PERLES) 100 MG capsule Take 1 capsule (100 mg total) by mouth 3 (three) times daily as needed for cough. (Patient not taking: Reported on 11/21/2014) 30 capsule 0   No current facility-administered medications for this encounter.    REVIEW OF SYSTEMS: A 15 point review of systems is documented in the electronic medical record. This was obtained by the nursing staff. However, I reviewed this with the patient to discuss relevant findings and make appropriate changes. Pertinent items are noted in HPI.  PHYSICAL EXAM:  height is 6\' 1"  (1.854 m) and weight is 257 lb 3.2 oz (116.665 kg). His blood pressure is 124/85 and his pulse is 89. His respiration is 16 and oxygen saturation is 100%.   The patient is in no acute distress today. Is alert and oriented. Motor strength is intact throughout. Mood and affect are appropriate. Speech is fluent articulate. Gait is steady.  KPS = 90  100 - Normal; no complaints; no evidence of disease. 90 - Able to carry on normal activity; minor signs or symptoms of disease. 80 - Normal activity with effort; some signs or symptoms of disease. 60 -  Cares for self; unable to carry on normal activity or to do active work. 60 - Requires occasional assistance, but is able to care for most of his personal needs. 50 - Requires considerable assistance and frequent medical care. 5 - Disabled; requires special care and assistance. 41 - Severely disabled; hospital admission is indicated although death not imminent. 31 - Very sick; hospital admission necessary; active supportive treatment necessary. 10 - Moribund; fatal processes progressing rapidly. 0 - Dead  Karnofsky DA, Abelmann [place], Craver LS and Burchenal [facility] [phone]) The use of the nitrogen mustards in the palliative treatment of carcinoma: with particular reference to bronchogenic carcinoma Cancer 1 634-56  LABORATORY DATA:   Recent Labs    Lab Results  Component Value Date   WBC 17.4* 11/02/2014   HGB 12.6* 11/02/2014   HCT 38.2* 11/02/2014   MCV 81.3 11/02/2014   PLT 274 11/02/2014      Recent Labs    Lab Results  Component Value Date   NA 140 11/02/2014   K 4.7 11/02/2014   CL 102 09/21/2013   CO2 23 11/02/2014      Recent Labs    Lab Results  Component Value Date   ALT 27 11/02/2014   AST 19 11/02/2014   ALKPHOS 87 11/02/2014   BILITOT 0.39 11/02/2014       Imaging Results    RADIOGRAPHY: Ct Head W Wo Contrast  11/09/2014 CLINICAL DATA: Left-sided headaches with nausea and vomiting. Personal history of lung cancer. EXAM: CT HEAD WITHOUT AND WITH CONTRAST TECHNIQUE: Contiguous axial  images were obtained from the base of the skull through the vertex without and with intravenous contrast CONTRAST: 164mL OMNIPAQUE IOHEXOL 300 MG/ML SOLN COMPARISON: CT head without contrast 07/02/2013. FINDINGS: A peripherally enhancing mass lesion in the left cerebellum measures 3.2 x 2.4 cm. There is significant surrounding vasogenic edema with mass effect on the fourth ventricle. A hyperdense  mass in the posterior right parietal lobe demonstrates some enhancement as well. The lesion measures 3.1 x 2.2 cm. There is surrounding vasogenic edema and mass effect. 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 No other focal metastatic lesions are evident. No acute cortical infarct is present. The ventricles are of normal size. No significant extra-axial fluid collection is present. IMPRESSION: 1. Hyperdense peripherally enhancing mass lesion in the left cerebellum measures 3.2 x 2.4 cm, compatible with a focal metastasis, potentially hemorrhagic. 2. Hyperdense mass lesion in the posterior right parietal lobe measures 3.1 x 2.2 cm. This may be a hyperdense lesion although some hemorrhage is suspected as well. 3. Prominent vasogenic edema is associated with both lesions. There is some mass effect on the fourth ventricle without hydrocephalus. Electronically Signed By: Lawrence Santiago M.D. On: 11/09/2014 11:21   Ct Chest W Contrast  11/02/2014 CLINICAL DATA: 59 year old male with history of lung cancer diagnosed in 2012 with recurrence diagnosed in 2013 status post surgical resection x2. Chemotherapy completed in 2014. EXAM: CT CHEST WITH CONTRAST TECHNIQUE: Multidetector CT imaging of the chest was performed during intravenous contrast administration. CONTRAST: 65mL OMNIPAQUE IOHEXOL 300 MG/ML SOLN COMPARISON: Chest CT 06/23/2014. FINDINGS: Mediastinum: Heart size is normal. There is no significant pericardial fluid, thickening or pericardial calcification. There is atherosclerosis of the thoracic aorta, the great vessels of the mediastinum and the coronary arteries, including calcified atherosclerotic plaque in the left main, left anterior descending, left circumflex and right coronary arteries. No pathologically enlarged mediastinal or hilar lymph nodes. Esophagus is unremarkable in appearance. Lungs/Pleura: Postoperative  changes of left lower lobectomy and left upper lobe wedge resection are again noted. Architectural distortion in the perihilar aspect of the left lung is unchanged, compatible with mild postoperative scarring. Again noted are multiple ill-defined ground-glass attenuation nodules throughout the right lung, largest of which measures 1.3 cm in the right upper lobe (image 26 of series 5). These appear very similar to prior examinations dating back to 03/25/2014. 4 mm right lower lobe nodule (image 46 of series 5) is also unchanged dating back to 05/20/2011, and can be considered benign requiring no imaging followup. No acute consolidative airspace disease. No pleural effusions. Upper Abdomen: Unremarkable. Musculoskeletal: There are no aggressive appearing lytic or blastic lesions noted in the visualized portions of the skeleton. Postthoracotomy changes in the left hemithorax redemonstrated. IMPRESSION: 1. Today's study demonstrates stable postoperative findings in the lungs, without evidence to suggest local recurrence of disease or new metastatic disease. 2. Multiple previously noted ground-glass attenuation nodules scattered throughout the right lung appears similar to recent prior examinations. Their persistence over time remains concerning, and continued attention on future followup studies is recommended. At this time, none of these demonstrate a central solid component. 3. Atherosclerosis, including left main and 3 vessel coronary artery disease. Please note that although the presence of coronary artery calcium documents the presence of coronary artery disease, the severity of this disease and any potential stenosis cannot be assessed on this non-gated CT examination. Assessment for potential risk factor modification, dietary therapy or pharmacologic therapy may be warranted, if clinically  indicated. Electronically Signed By: Vinnie Langton M.D. On: 11/02/2014 14:01   Mr Jeri Cos HY  Contrast  11/18/2014 CLINICAL DATA: New diagnosis metastatic disease from known lung cancer. S RS targeting. EXAM: MRI HEAD WITHOUT AND WITH CONTRAST TECHNIQUE: Multiplanar, multiecho pulse sequences of the brain and surrounding structures were obtained without and with intravenous contrast. CONTRAST: 64mL MULTIHANCE GADOBENATE DIMEGLUMINE 529 MG/ML IV SOLN COMPARISON: Head CT 11/09/2014. MRI 06/06/2011. FINDINGS: Within the peripheral left cerebellar hemisphere, there is a centrally necrotic mass measuring 3 x 2.4 by 2.4 cm consistent with metastatic disease. No other cerebellar mass. Within the right parieto-occipital peripheral brain, there is a centrally necrotic mass measuring 3.3 x 2.5 by 3.2 cm consistent with a metastasis. No other lesion is identified. There is an incidental venous angioma in the left frontal lobe. There is no brain infarction. No hydrocephalus. No extra-axial collection. No visible skull or skullbase metastatic lesion. IMPRESSION: Two large metastases showing necrosis, 1 in the peripheral left cerebellum measuring 3 x 2.4 x 2.4 cm and the other within the peripheral right parieto-occipital region measuring 3.3 x 2.5 x 3.2 cm. Both are associated with vasogenic edema but there is no significant mass effect/shift. Electronically Signed By: Nelson Chimes M.D. On: 11/18/2014 16:46      IMPRESSION: This patient is a very nice 59 yo man with two brain metastases. The patient would benefit from surgical resection of his brain metastases.* In addition, the patient would potentially benefit from radiotherapy.* The options include whole brain irradiation versus stereotactic radiosurgery. There are pros and cons associated with each of these potential treatment options. Whole brain radiotherapy would treat the known metastatic deposits and help provide some reduction of risk for future brain metastases. However, whole brain radiotherapy carries potential risks including hair  loss, subacute somnolence, and neurocognitive changes including a possible reduction in short-term memory. Whole brain radiotherapy also may carry a lower likelihood of tumor control at the treatment sites because of the low-dose used. Stereotactic radiosurgery carries a higher likelihood for local tumor control at the targeted sites with lower associated risk for neurocognitive changes such as memory loss.* However, the use of stereotactic radiosurgery in this setting may leave the patient at increased risk for new brain metastases elsewhere in the brain as high as 50-60%. Accordingly, patients who receive stereotactic radiosurgery in this setting should undergo ongoing surveillance imaging with brain MRI more frequently in order to identify and treat new small brain metastases before they become symptomatic. Stereotactic radiosurgery does carry some different risks, including a risk of radionecrosis.  The patient's case was discussed in our coordinated brain tumor conference and it was elected to proceed with preoperative SRS followed by surgical removal of these two large metastatic deposits.   PLAN: We reviewed the findings and workup thus far with the patient. We discussed the dilemma regarding whole brain radiotherapy versus stereotactic radiosurgery. We discussed the pros and cons of each. We also discussed the logistics and delivery of each. We reviewed the results associated with each of the treatments described above. The patient seems to understand the treatment options and would like to proceed with stereotactic radiosurgery in conjunction with operative resection of these brain massess.  In terms of timing of the stereotactic radiosurgery, evidence suggests that the risk of radionecrosis and leptomeningeal recurrence is lower when used in the pre-operative setting as opposed to post-operative SRS.*  The patient has undergone SRS Treatment  and now presents for surgical resection.   Peggyann Shoals, MD    *  References:  1: Patchell RA, Tibbs PA, Helene Shoe, [address] RJ, [place], Guy Sandifer JS, [place] B. A randomized trial of surgery in the treatment of single metastases to the brain. [place] Feb 22;322(8):494-500. PubMed PMID: [id number].   2: Patchell RA, Tibbs PA, Regine WF, Jonita Albee, Mohiuddin M, Arrie [place], [place], Metz, [place] B. Postoperative radiotherapy in the treatment of single metastases to the brain: a randomized trial. JAMA. 1998 Nov 4;280(17):1485-9. PubMed PMID: 8887579.   3: Erlene Senters, Wenda Low, Hess KR, Tomie China, Lang FF, Kornguth DG, [place], Swint JM, Shiu AS, Maor MH, [place]. Neurocognition in patients with brain metastases treated with radiosurgery or radiosurgery plus whole-brain irradiation: a randomised controlled trial. Lancet Oncol. 2009 Nov;10(11):1037-44. doi: 10.1016/S1470-2045(09)70263-3. Epub 2009 Oct 2. PubMed PMID: 72820601.  4: Weyman Rodney, Estill Dooms, Coralee Pesa, Crocker IR, Lorie Phenix, Charlesetta Garibaldi, Press RH, Tanya Nones, Mingus NM, Wait SD, Higinio Plan, Shu HG, [place]. Comparing Preoperative With Postoperative Stereotactic Radiosurgery for Resectable Brain Metastases: A Multi-institutional Analysis. Neurosurgery. 2015 Nov 2. [Epub ahead of print] PubMed PMID: [id number].

## 2014-11-28 NOTE — Progress Notes (Signed)
Awake, alert, conversant.  MAEW.  Doing well.  

## 2014-11-28 NOTE — Transfer of Care (Signed)
Immediate Anesthesia Transfer of Care Note  Patient: [name] [name]  Procedure(s) Performed: Procedure(s): CRANIOTOMY TUMOR EXCISION (N/A)  Patient Location: PACU  Anesthesia Type:General  Level of Consciousness: awake and responds to stimulation  Airway & Oxygen Therapy: Patient Spontanous Breathing and Patient connected to face mask oxygen  Post-op Assessment: Report given to PACU RN, Post -op Vital signs reviewed and stable and Patient moving all extremities X 4  Post vital signs: Reviewed and stable  Complications: No apparent anesthesia complications

## 2014-11-28 NOTE — Interval H&P Note (Signed)
History and Physical Interval Note:  11/28/2014 7:17 AM  [name] [name]  has presented today for surgery, with the diagnosis of tumor  The various methods of treatment have been discussed with the patient and family. After consideration of risks, benefits and other options for treatment, the patient has consented to  Procedure(s): CRANIOTOMY TUMOR EXCISION (N/A) as a surgical intervention .  The patient's history has been reviewed, patient examined, no change in status, stable for surgery.  I have reviewed the patient's chart and labs.  Questions were answered to the patient's satisfaction.     Aviah Sorci D

## 2014-11-28 NOTE — Op Note (Signed)
11/28/2014  11:13 AM  PATIENT:  [name] [name]  59 y.o. male  PRE-OPERATIVE DIAGNOSIS:  Left cerebellar and right parietal brain metastasis with lung cancer primary  POST-OPERATIVE DIAGNOSIS:  Left cerebellar and right parietal brain metastasis with lung cancer primary  PROCEDURE:  Procedure(s): CRANIOTOMY TUMOR EXCISION (N/A)  SURGEON:  Surgeon(s) and Role:    * Erline Levine, MD - Primary    * Newman Pies, MD - Assisting  PHYSICIAN ASSISTANT:   ASSISTANTS: Poteat, RN  ANESTHESIA:   general  EBL:  Total I/O In: 1200 [I.V.:1200] Out: 1000 [Urine:700; Blood:300]  BLOOD ADMINISTERED:none  DRAINS: none   LOCAL MEDICATIONS USED:  LIDOCAINE   SPECIMEN:  Excision  DISPOSITION OF SPECIMEN:  PATHOLOGY  COUNTS:  YES  TOURNIQUET:  * No tourniquets in log *  DICTATION: Patient is 59ear old man with a large cerebellar metastatic lung cancer mass and a right parietal metastasis. He received preoperative radiosurgery to these lesiona and preoperative planning with Brain Lab MRI. It was elected to take him to surgery for resection of these masses.  Procedure:  Following smooth intubation, patient was placed in prone position on chest rolls with 3 pin head fixation. Posterior parietal and suboccipital region was shaved and prepped and draped in usual sterile fashion with betadine scrub and Duraprep after BrainLab was used to localize surgery and tumors..  Area of planned incision was infiltrated with lidocaine. A linear incision was made in the suboccipital region to expose calvarium and extended to the parietal region, then limbed to the right. High speed drill was used to perform bilateral suboccipital craniectomy to expose the dura and 4 Bur holes were placed and flap elevated in the right posterior parietal region.  Tumor location was confirmed with BrainLab probe. Dura was opened over left cerebellar hemisphere.  A corticotomy was created in the left cerebellar hemisphere and carried  to the tumor, which was mobilized and resected.  The timor was adherent to the brain and Dura.  The tumor was removed with suction cautery technique and samples were sent to Pathology for evaluation.  The Dura was opened over the right parietal region.  The tumor came to the surface and was removed.  Extent of resection was confirmed with BrainLab probe and with use of operating microscope.  Hemostasis was assured with irrigation and cotton balls.  Hemostasis was assured.  The brain was considerably more relaxed after tumor resection.  The tumorcavity was lined with Surgicell. The dura was patched with Dura-matrix and  the fascia was closed with 0 vicryl sutures, subcutaneous tissues were reapproximated with 2-0 vicryl sutures and the skin was re approximated with  A 3-0 Nylon sutures.  A sterile occlusive dressing was placed.  Patient was returned to a supine position and taken out of pins and extubated in the operating room and taken to Recovery having tolerated his surgery well.  Counts were correct at the end of the case.  PLAN OF CARE: Admit to inpatient   PATIENT DISPOSITION:  PACU - hemodynamically stable.   Delay start of Pharmacological VTE agent (>58hrs) due to surgical blood loss or risk of bleeding: yes

## 2014-11-28 NOTE — Anesthesia Preprocedure Evaluation (Deleted)
Anesthesia Evaluation  Patient identified by MRN, date of birth, ID band Patient awake    Reviewed: Allergy & Precautions, NPO status , Patient's Chart, lab work & pertinent test results  Airway Mallampati: II   Neck ROM: full    Dental   Pulmonary shortness of breath, COPDformer smoker,  Lung CA         Cardiovascular hypertension,     Neuro/Psych Anxiety Pt has 2 brain mets.    GI/Hepatic   Endo/Other  obese  Renal/GU Renal InsufficiencyRenal disease     Musculoskeletal  (+) Arthritis -,   Abdominal   Peds  Hematology   Anesthesia Other Findings   Reproductive/Obstetrics                            Anesthesia Physical Anesthesia Plan  ASA: III  Anesthesia Plan: General   Post-op Pain Management:    Induction: Intravenous  Airway Management Planned: Oral ETT  Additional Equipment: Arterial line, CVP and Ultrasound Guidance Line Placement  Intra-op Plan:   Post-operative Plan: Possible Post-op intubation/ventilation  Informed Consent: I have reviewed the patients History and Physical, chart, labs and discussed the procedure including the risks, benefits and alternatives for the proposed anesthesia with the patient or authorized representative who has indicated his/her understanding and acceptance.     Plan Discussed with: CRNA, Anesthesiologist and Surgeon  Anesthesia Plan Comments:         Anesthesia Quick Evaluation

## 2014-11-29 ENCOUNTER — Inpatient Hospital Stay (HOSPITAL_COMMUNITY): Payer: BLUE CROSS/BLUE SHIELD

## 2014-11-29 MED ORDER — LORAZEPAM 1 MG PO TABS: 1.0000 mg | ORAL_TABLET | Freq: Once | ORAL | Status: AC

## 2014-11-29 MED ORDER — LORAZEPAM 2 MG/ML IJ SOLN
1.0000 mg | Freq: Once | INTRAMUSCULAR | Status: AC
  Administered 2014-11-29: 1 mg via INTRAMUSCULAR

## 2014-11-29 MED ORDER — PANTOPRAZOLE SODIUM 40 MG PO TBEC
40.0000 mg | DELAYED_RELEASE_TABLET | Freq: Every day | ORAL | Status: DC
  Administered 2014-11-29 – 2014-11-30 (×2): 40 mg via ORAL
  Filled 2014-11-29 (×2): qty 1

## 2014-11-29 MED ORDER — GADOBENATE DIMEGLUMINE 529 MG/ML IV SOLN
20.0000 mL | Freq: Once | INTRAVENOUS | Status: AC
  Administered 2014-11-29: 20 mL via INTRAVENOUS

## 2014-11-29 MED ORDER — LORAZEPAM 2 MG/ML IJ SOLN
INTRAMUSCULAR | Status: AC
  Filled 2014-11-29: qty 1

## 2014-11-29 NOTE — Progress Notes (Signed)
Subjective: Patient reports "I can't move my neck...because of the pain"  Objective: Vital signs in last 24 hours: Temp:  [97.1 F (36.2 C)-98.7 F (37.1 C)] 98.1 F (36.7 C) (01/19 0836) Pulse Rate:  [55-87] 70 (01/19 0836) Resp:  [9-21] 12 (01/19 0836) BP: (99-150)/(54-92) 131/61 mmHg (01/19 0836) SpO2:  [95 %-100 %] 98 % (01/19 0836) Arterial Line BP: (108-197)/(63-140) 157/73 mmHg (01/19 0725) Weight:  [116.4 kg (256 lb 9.9 oz)] 116.4 kg (256 lb 9.9 oz) (01/18 1415)  Intake/Output from previous day: 01/18 0701 - 01/19 0700 In: 3363.7 [I.V.:3063.7; IV Piggyback:300] Out: 4500 [Urine:4200; Blood:300] Intake/Output this shift: Total I/O In: -  Out: 450 [Urine:450]  Alert, conversant, smiling. Reports posterior cervical muscle pain this am. Drsg intact, drainage stained. MAEW. PEARL. No drift.   Lab Results: No results for input(s): WBC, HGB, HCT, PLT in the last 72 hours. BMET No results for input(s): NA, K, CL, CO2, GLUCOSE, BUN, CREATININE, CALCIUM in the last 72 hours.  Studies/Results: Dg Chest Port 1 View  11/28/2014   CLINICAL DATA:  Central line placement.  EXAM: PORTABLE CHEST - 1 VIEW  COMPARISON:  08/09/2014  FINDINGS: There has been placement of a right IJ central venous catheter with tip overlying the region of the SVC through increased lucency of the left lung unchanged. Lungs are adequately inflated as there is no focal consolidation or effusion. No evidence of right-sided pneumothorax. Cardiomediastinal silhouette and remainder of the exam is unchanged.  IMPRESSION: No active disease.  Right IJ central venous catheter with tip overlying the region of the SVC. No pneumothorax.   Electronically Signed   By: Marin Olp M.D.   On: 11/28/2014 12:23    Assessment/Plan: Doing well   LOS: 1 day  Ok per DrStern for Ativan 1mg  before MRI today.    Verdis Prime 11/29/2014, 10:14 AM

## 2014-11-29 NOTE — Anesthesia Postprocedure Evaluation (Signed)
Anesthesia Post Note  Patient: [name] [name]  Procedure(s) Performed: Procedure(s) (LRB): CRANIOTOMY TUMOR EXCISION (N/A)  Anesthesia type: General  Patient location: PACU  Post pain: Pain level controlled and Adequate analgesia  Post assessment: Post-op Vital signs reviewed, Patient's Cardiovascular Status Stable, Respiratory Function Stable, Patent Airway and Pain level controlled  Last Vitals:  Filed Vitals:   11/29/14 1110  BP:   Pulse: 70  Temp:   Resp: 13    Post vital signs: Reviewed and stable  Level of consciousness: awake, alert  and oriented  Complications: No apparent anesthesia complications

## 2014-11-30 ENCOUNTER — Encounter (HOSPITAL_COMMUNITY): Payer: Self-pay | Admitting: Neurosurgery

## 2014-11-30 MED ORDER — LEVETIRACETAM 500 MG PO TABS
500.0000 mg | ORAL_TABLET | Freq: Two times a day (BID) | ORAL | Status: DC
  Administered 2014-11-30 – 2014-12-01 (×3): 500 mg via ORAL
  Filled 2014-11-30 (×5): qty 1

## 2014-11-30 NOTE — Progress Notes (Signed)
Subjective: Patient reports feeling well  Objective: Vital signs in last 24 hours: Temp:  [98.1 F (36.7 C)-98.7 F (37.1 C)] 98.4 F (36.9 C) (01/20 0446) Pulse Rate:  [60-104] 61 (01/20 0700) Resp:  [10-20] 11 (01/20 0700) BP: (97-142)/(61-83) 118/81 mmHg (01/20 0700) SpO2:  [95 %-100 %] 96 % (01/20 0700) Arterial Line BP: (117-160)/(71-117) 153/82 mmHg (01/19 1200)  Intake/Output from previous day: 01/19 0701 - 01/20 0700 In: 3320 [P.O.:1320; I.V.:1800; IV Piggyback:200] Out: 2950 [Urine:2950] Intake/Output this shift:    Physical Exam: Full strength, no drift, minimal drainage on dressing  Lab Results: No results for input(s): WBC, HGB, HCT, PLT in the last 72 hours. BMET No results for input(s): NA, K, CL, CO2, GLUCOSE, BUN, CREATININE, CALCIUM in the last 72 hours.  Studies/Results: Mr [name] [name] Contrast  11/29/2014   CLINICAL DATA:  59 year old male with metastatic lung cancer to the brain status post preoperative stereotactic radio surgery treatment on 11/24/2014. Now status post surgical resection of left cerebellar and right parietal brain metastases. Subsequent encounter.  EXAM: MRI HEAD WITHOUT AND WITH CONTRAST  TECHNIQUE: Multiplanar, multiecho pulse sequences of the brain and surrounding structures were obtained without and with intravenous contrast.  CONTRAST:  50mL MULTIHANCE GADOBENATE DIMEGLUMINE 529 MG/ML IV SOLN  COMPARISON:  Pretreatment SRS targeting exam 11/18/2014, and earlier.  FINDINGS: Posterior right parietal lobe resection cavity with postoperative fluid and blood products. Some T1 intrinsic blood products are noted. Minimal residual enhancement along the margins of the cavity otherwise probably is vascular in nature. However, there is restricted diffusion in an area of 2 cm anterior and lateral to the cavity (series 4, image 18). Surrounding T2 and FLAIR hyperintensity is stable.  Posterior and lateral left cerebellar resection cavity with fluid and  blood products. Here there is more nodular residual enhancement noted mostly along the anterior aspect of the cavity, encompassing 8 mm (series 9, image 33). Minimal restricted diffusion along the cavity margins. Stable left cerebellar T2 and FLAIR hyperintensity.  Overlying craniotomy changes. No other restricted diffusion. No midline shift. No ventriculomegaly. No other intracranial hemorrhage. No other abnormal enhancement. Left frontal lobe DVA re- identified. Major intracranial vascular flow voids are stable. Negative pituitary, cervicomedullary junction, and visualized cervical spine. Bone marrow signal remains normal.  Visualized orbit soft tissues are within normal limits. Mild mastoid effusions. Similar paranasal sinus inflammation.  IMPRESSION: 1. Right parietal lobe metastasis gross total resection. Note 2 cm area of restricted diffusion mostly anterior to the resection cavity. 2. Left cerebellar hemisphere tumor resection with 8 mm area of nodular enhancement at the anterior/lateral cavity. Attention directed on followup.   Electronically Signed   By: Lars Pinks M.D.   On: 11/29/2014 14:05   Dg Chest Port 1 View  11/28/2014   CLINICAL DATA:  Central line placement.  EXAM: PORTABLE CHEST - 1 VIEW  COMPARISON:  08/09/2014  FINDINGS: There has been placement of a right IJ central venous catheter with tip overlying the region of the SVC through increased lucency of the left lung unchanged. Lungs are adequately inflated as there is no focal consolidation or effusion. No evidence of right-sided pneumothorax. Cardiomediastinal silhouette and remainder of the exam is unchanged.  IMPRESSION: No active disease.  Right IJ central venous catheter with tip overlying the region of the SVC. No pneumothorax.   Electronically Signed   By: Marin Olp M.D.   On: 11/28/2014 12:23    Assessment/Plan: Transfer to floor and mobilize.  Small amount of retained  tumor in posterior fossa (this was adherent to Transverse  Sinus and left intentionally) with gross total resection of parietal metastasis.  Patient doing well.    LOS: 2 days    Peggyann Shoals, MD 11/30/2014, 7:44 AM

## 2014-11-30 NOTE — Evaluation (Signed)
Physical Therapy Evaluation Patient Details Name: [name] [name] MRN: [id number] DOB: 02-May-1956 Today's Date: 11/30/2014   History of Present Illness  [name] [name] is a 59 y.o. male who was found to have a left upper lung mass in 2012. 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 The patient underwent 3 cycles of chemotherapy followed by left upper lung wedge resection as well as wedge resection of the left lower lobe and lymph node dissection. This showed minimal viable tumor remaining. The patient had a recurrent lung nodule requiring repeat thoracotomy in June 2014. At this point, PET scan suggested lymph node involvement. He underwent wedge resection of the left lower lung nodule confirming non-small cell cancer and this was followed by left lower lobectomy and lymph node dissection. At this point, the patient had adenocarcinoma with lymph node involvement. The patient was given chemotherapy in the adjuvant setting completed in August 2014. He recently developed headaches and nausea prompting head CT which showed 2 brain metastases. Pt underwent crani with tumor excision 11/28/14. PMH also includes fall with tib/ fib fx L in 2014  Clinical Impression  Pt independent with activity currently, ambulating with no weakness or LOB noted. Will likely need outpt PT to address cervical ROM when cleared by Dr Vertell Limber. PT signing off. Spoke with pt and RN about pt walking in hall with nursing staff or family 3-5x/ day.    Follow Up Recommendations Outpatient PT;Supervision - Intermittent    Equipment Recommendations  None recommended by PT    Recommendations for Other Services       Precautions / Restrictions Precautions Precautions: None Precaution Comments: when pt fell in 2014, it was just after finishing chemo Restrictions Weight Bearing Restrictions: No      Mobility  Bed Mobility Overal bed mobility: Independent                 Transfers Overall transfer level: Independent                  Ambulation/Gait Ambulation/Gait assistance: Modified independent (Device/Increase time) Ambulation Distance (Feet): 150 Feet Assistive device: Rolling walker (2 wheeled) Gait Pattern/deviations: WFL(Within Functional Limits) Gait velocity: WFL Gait velocity interpretation: at or above normal speed for age/gender General Gait Details: pt ambulated with RW only because of pain meds, no LOB within room without RW, no gait deviations or balance impairment noted  Stairs            Wheelchair Mobility    Modified Rankin (Stroke Patients Only)       Balance Overall balance assessment: No apparent balance deficits (not formally assessed)                                           Pertinent Vitals/Pain Pain Assessment: 0-10 Pain Score: 6  Pain Location: posterior head Pain Intervention(s): Limited activity within patient's tolerance;Monitored during session;Patient requesting pain meds-RN notified;RN gave pain meds during session    Home Living Family/patient expects to be discharged to:: Private residence Living Arrangements: Spouse/significant other Available Help at Discharge: Family;Available PRN/intermittently Type of Home: House Home Access: Ramped entrance     Home Layout: One level Home Equipment: Wheelchair - [employer] - 2 wheels      Prior Function Level of Independence: Independent         Comments: wife works but will take some time off  to be with him when he first gets home     Hand Dominance        Extremity/Trunk Assessment   Upper Extremity Assessment: Overall WFL for tasks assessed           Lower Extremity Assessment: Overall WFL for tasks assessed      Cervical / Trunk Assessment: Kyphotic (neck very stiff, TBA on outpt basis )  Communication   Communication: No difficulties  Cognition Arousal/Alertness: Awake/alert Behavior During  Therapy: WFL for tasks assessed/performed Overall Cognitive Status: Within Functional Limits for tasks assessed                      General Comments General comments (skin integrity, edema, etc.): pt with limited neck ROM currently but to bed expected from surgery, will likely need outpt PT after cleared by Dr Vertell Limber    Exercises        Assessment/Plan    PT Assessment All further PT needs can be met in the next venue of care  PT Diagnosis Other (comment) (limited cervical ROM)   PT Problem List Decreased range of motion  PT Treatment Interventions     PT Goals (Current goals can be found in the Care Plan section) Acute Rehab PT Goals Patient Stated Goal: return home PT Goal Formulation: All assessment and education complete, DC therapy    Frequency     Barriers to discharge        Co-evaluation               End of Session Equipment Utilized During Treatment: Gait belt Activity Tolerance: Patient tolerated treatment well Patient left: in chair;with call bell/phone within reach Nurse Communication: Mobility status         Time: 1150-1208 PT Time Calculation (min) (ACUTE ONLY): 18 min   Charges:   PT Evaluation $Initial PT Evaluation Tier I: 1 Procedure PT Treatments $Gait Training: 8-22 mins   PT G Codes:      Leighton Roach, PT  Acute Rehab Services  [place], [place] 11/30/2014, 12:17 PM

## 2014-11-30 NOTE — Progress Notes (Signed)
Pt is transferred from 3 M to 4N13. Admission vital sign s are stable. Pt complained  of pain and was medicated

## 2014-12-01 NOTE — Progress Notes (Signed)
Patient is being discharged home. Discharge instructions, including pain meds, sign of infection and when to call the doctor, were given to patient and family

## 2014-12-01 NOTE — Discharge Summary (Signed)
Physician Discharge Summary  Patient ID: [name] [name] MRN: [id number] DOB/AGE: 1956-10-14 59 y.o.  Admit date: 11/28/2014 Discharge date: 12/01/2014  Admission Diagnoses: Left cerebellar and right parietal brain metastasis with lung cancer primary   Discharge Diagnoses: Left cerebellar and right parietal brain metastasis with lung cancer primary s/p CRANIOTOMY TUMOR EXCISION (N/A)  Active Problems:   Metastasis to brain   Discharged Condition: good  Hospital Course: [name] [name] was admitted for craniotomy(ies) for left cerebellar & right parietal masses.  Following uncomplicated surgery, pt recovered nicely & transferred to Neuro ICU, and [facility]. He has progressed nicely.  Consults: None  Significant Diagnostic Studies: Pathology pending  Treatments: surgery: CRANIOTOMY TUMOR EXCISION    Discharge Exam: Blood pressure 119/77, pulse 97, temperature 97.9 F (36.6 C), temperature source Oral, resp. rate 16, height 6\' 1"  (1.854 m), weight 116.4 kg (256 lb 9.9 oz), SpO2 100 %. Alert, conversant, sitting in chair. DSD over incision - incision without erythema, swelling, or drainage. PEARL. No headache at present. Mobilizing with walker.   Disposition: 01-Home or Self Care Per Dr. Vertell Limber, d/c IV; d/c to home. Stop Decadron. Rx's for Valium & Norco to chart for prn use. Pt verbalizes understanding of d/c instructions & agrees to call office for 2 week appt for staple removal.      Medication List    ASK your doctor about these medications        albuterol (2.5 MG/3ML) 0.083% nebulizer solution  Commonly known as:  PROVENTIL  Take 3 mLs (2.5 mg total) by nebulization every 4 (four) hours as needed for wheezing or shortness of breath.     albuterol 108 (90 BASE) MCG/ACT inhaler  Commonly known as:  PROVENTIL HFA;VENTOLIN HFA  Inhale 1-2 puffs into the lungs every 4 (four) hours as needed for wheezing.     ALPRAZolam 0.25 MG tablet  Commonly known as:  XANAX  Take 1 tablet  (0.25 mg total) by mouth 3 (three) times daily as needed for anxiety.     benzonatate 100 MG capsule  Commonly known as:  TESSALON PERLES  Take 1 capsule (100 mg total) by mouth 3 (three) times daily as needed for cough.     dexamethasone 4 MG tablet  Commonly known as:  DECADRON  Take 1 tablet (4 mg total) by mouth 3 (three) times daily.     lisinopril-hydrochlorothiazide 20-25 MG per tablet  Commonly known as:  PRINZIDE,ZESTORETIC  Take 1 tablet by mouth every morning.     LORazepam 1 MG tablet  Commonly known as:  ATIVAN  Take 1 mg by mouth daily as needed for anxiety (Takes when having procedures).     pravastatin 40 MG tablet  Commonly known as:  PRAVACHOL  Take 40 mg by mouth at bedtime.     Tiotropium Bromide-Olodaterol 2.5-2.5 MCG/ACT Aers  Commonly known as:  STIOLTO RESPIMAT  Inhale 2 puffs into the lungs daily.         Signed: Verdis Prime 12/01/2014, 1:21 PM

## 2014-12-01 NOTE — Progress Notes (Signed)
Subjective: Patient reports "I feel pretty good"  Objective: Vital signs in last 24 hours: Temp:  [97.9 F (36.6 C)-99 F (37.2 C)] 97.9 F (36.6 C) (01/21 0911) Pulse Rate:  [67-97] 97 (01/21 0911) Resp:  [16-20] 16 (01/21 0911) BP: (90-128)/(55-77) 119/77 mmHg (01/21 0911) SpO2:  [94 %-100 %] 100 % (01/21 0911)  Intake/Output from previous day: 01/20 0701 - 01/21 0700 In: 170 [P.O.:120; I.V.:50] Out: 2075 [Urine:2075] Intake/Output this shift:    Alert, conversant, sitting in chair. DSD over incision - incision without erythema, swelling, or drainage. PEARL. No headache at present. Mobilizing with walker.  Lab Results: No results for input(s): WBC, HGB, HCT, PLT in the last 72 hours. BMET No results for input(s): NA, K, CL, CO2, GLUCOSE, BUN, CREATININE, CALCIUM in the last 72 hours.  Studies/Results: Mr [name] [name] Contrast  11/29/2014   CLINICAL DATA:  59 year old male with metastatic lung cancer to the brain status post preoperative stereotactic radio surgery treatment on 11/24/2014. Now status post surgical resection of left cerebellar and right parietal brain metastases. Subsequent encounter.  EXAM: MRI HEAD WITHOUT AND WITH CONTRAST  TECHNIQUE: Multiplanar, multiecho pulse sequences of the brain and surrounding structures were obtained without and with intravenous contrast.  CONTRAST:  94mL MULTIHANCE GADOBENATE DIMEGLUMINE 529 MG/ML IV SOLN  COMPARISON:  Pretreatment SRS targeting exam 11/18/2014, and earlier.  FINDINGS: Posterior right parietal lobe resection cavity with postoperative fluid and blood products. Some T1 intrinsic blood products are noted. Minimal residual enhancement along the margins of the cavity otherwise probably is vascular in nature. However, there is restricted diffusion in an area of 2 cm anterior and lateral to the cavity (series 4, image 18). Surrounding T2 and FLAIR hyperintensity is stable.  Posterior and lateral left cerebellar resection cavity with  fluid and blood products. Here there is more nodular residual enhancement noted mostly along the anterior aspect of the cavity, encompassing 8 mm (series 9, image 33). Minimal restricted diffusion along the cavity margins. Stable left cerebellar T2 and FLAIR hyperintensity.  Overlying craniotomy changes. No other restricted diffusion. No midline shift. No ventriculomegaly. No other intracranial hemorrhage. No other abnormal enhancement. Left frontal lobe DVA re- identified. Major intracranial vascular flow voids are stable. Negative pituitary, cervicomedullary junction, and visualized cervical spine. Bone marrow signal remains normal.  Visualized orbit soft tissues are within normal limits. Mild mastoid effusions. Similar paranasal sinus inflammation.  IMPRESSION: 1. Right parietal lobe metastasis gross total resection. Note 2 cm area of restricted diffusion mostly anterior to the resection cavity. 2. Left cerebellar hemisphere tumor resection with 8 mm area of nodular enhancement at the anterior/lateral cavity. Attention directed on followup.   Electronically Signed   By: Lars Pinks M.D.   On: 11/29/2014 14:05    Assessment/Plan: Improving    LOS: 3 days  Per Dr. Vertell Limber, d/c IV; d/c to home. Rx's for Valium & Norco to chart for prn use. Pt verbalizes understanding of d/c instructions & agrees to call office for 2 week appt for staple removal.   Uday Jantz, Aaron Edelman 12/01/2014, 12:58 PM

## 2015-01-09 ENCOUNTER — Ambulatory Visit
Admission: RE | Admit: 2015-01-09 | Discharge: 2015-01-09 | Disposition: A | Discharge status: Home / Self Care | Payer: Medicare Other | Source: Ambulatory Visit | Attending: Radiation Oncology | Admitting: Radiation Oncology

## 2015-01-09 ENCOUNTER — Encounter: Payer: Self-pay | Admitting: Radiation Oncology

## 2015-01-09 ENCOUNTER — Ambulatory Visit: Payer: Medicare Other

## 2015-01-09 VITALS — BP 152/92 | HR 94 | Resp 16 | Wt 256.5 lb

## 2015-01-09 DIAGNOSIS — C7931 Secondary malignant neoplasm of brain: Secondary | ICD-10-CM

## 2015-01-09 MED ORDER — TRAMADOL HCL 50 MG PO TABS: 50.0000 mg | ORAL_TABLET | Freq: Four times a day (QID) | ORAL | Status: DC | PRN

## 2015-01-09 NOTE — Progress Notes (Signed)
Patient reports that he hurts from head to toe. Reports taking percocet for pain. Reports vertical occipital incision remain sore. Incision well healed without redness, drainage or edema. Patient unable to rate pain or describe intensity. Denies dizziness. Reports intermittent headaches. Wife reports patient is depressed and emotional. Wife reports the patient is slow to dress. Reports she got him up at 0530 this morning and he was barely ready to leave at [place]. Patient reports he has to take breaks and rest while dressing. Shuffling gait noted. Wife reports patient is "mentally mixed up often." Denies taking decadron. Denies seizure activity. BP slightly elevated. No thrush noted.

## 2015-01-09 NOTE — Progress Notes (Signed)
Radiation Oncology         [phone] ________________________________  Name: [name] [name] MRN: [id number]  Date: 01/09/2015  DOB: December 18, 1955  Follow-Up Visit Note  CC: HAMRICK,MAURA L, MD  Hamrick, Lorin Mercy, MD  Diagnosis:   59 yo man with 2 brain metastases s/p pre-op SRS - 11/24/2014 (Right parietal 3.3 cm met treated to 15 Gy, and Left cerebellar 3 cm met treated to 18 Gy)    ICD-9-CM ICD-10-CM   1. Brain metastases 198.3 C79.31     Interval Since Last Radiation:  4  weeks  Narrative:  The patient returns today for routine follow-up.  Patient reports that he hurts from head to toe. Reports taking percocet for pain. Reports vertical occipital incision remain sore. Incision well healed without redness, drainage or edema. Patient unable to rate pain or describe intensity. Denies dizziness. Reports intermittent headaches. Wife reports patient is depressed and emotional. Wife reports the patient is slow to dress. Reports she got him up at 0530 this morning and he was barely ready to leave at [place]. Patient reports he has to take breaks and rest while dressing. Shuffling gait noted. Wife reports patient is "mentally mixed up often." Denies taking decadron. Denies seizure activity. BP slightly elevated. No thrush noted.                              ALLERGIES:  has No Known Allergies.  Meds: Current Outpatient Prescriptions  Medication Sig Dispense Refill  . albuterol (PROVENTIL HFA;VENTOLIN HFA) 108 (90 BASE) MCG/ACT inhaler Inhale 1-2 puffs into the lungs every 4 (four) hours as needed for wheezing.    Marland Kitchen albuterol (PROVENTIL) (2.5 MG/3ML) 0.083% nebulizer solution Take 3 mLs (2.5 mg total) by nebulization every 4 (four) hours as needed for wheezing or shortness of breath. 75 mL 12  . ALPRAZolam (XANAX) 0.25 MG tablet Take 1 tablet (0.25 mg total) by mouth 3 (three) times daily as needed for anxiety. 30 tablet 0  . lisinopril-hydrochlorothiazide (PRINZIDE,ZESTORETIC) 20-25 MG per tablet  Take 1 tablet by mouth every morning.     Marland Kitchen LORazepam (ATIVAN) 1 MG tablet Take 1 mg by mouth daily as needed for anxiety (Takes when having procedures).    . pravastatin (PRAVACHOL) 40 MG tablet Take 40 mg by mouth at bedtime.     . Tiotropium Bromide-Olodaterol (STIOLTO RESPIMAT) 2.5-2.5 MCG/ACT AERS Inhale 2 puffs into the lungs daily. 4 g 5  . benzonatate (TESSALON PERLES) 100 MG capsule Take 1 capsule (100 mg total) by mouth 3 (three) times daily as needed for cough. (Patient not taking: Reported on 01/09/2015) 30 capsule 0  . dexamethasone (DECADRON) 4 MG tablet Take 1 tablet (4 mg total) by mouth 3 (three) times daily. (Patient not taking: Reported on 01/09/2015) 45 tablet 0   No current facility-administered medications for this encounter.    Physical Findings: The patient is in no acute distress. Patient is alert and oriented.  weight is 256 lb 8 oz (116.348 kg). His blood pressure is 152/92 and his pulse is 94. His respiration is 16. .  No significant changes.  Lab Findings: Lab Results  Component Value Date   WBC 12.4* 11/23/2014   WBC 17.4* 11/02/2014   HGB 13.6 11/23/2014   HGB 12.6* 11/02/2014   HCT 40.7 11/23/2014   HCT 38.2* 11/02/2014   PLT 210 11/23/2014   PLT 274 11/02/2014    Lab Results  Component Value Date  NA 131* 11/23/2014   NA 140 11/02/2014   NA 138 03/25/2012   K 4.2 11/23/2014   K 4.7 11/02/2014   K 4.2 03/25/2012   CHLORIDE 107 11/02/2014   CO2 25 11/23/2014   CO2 23 11/02/2014   CO2 26 03/25/2012   GLUCOSE 113* 11/23/2014   GLUCOSE 122 11/02/2014   GLUCOSE 107* 03/03/2013   GLUCOSE 99 03/25/2012   BUN 38* 11/23/2014   BUN 25.5 11/02/2014   BUN 11 03/25/2012   CREATININE 1.67* 11/23/2014   CREATININE 1.6* 11/02/2014   CREATININE 1.0 03/25/2012   BILITOT 0.39 11/02/2014   BILITOT 0.5 07/02/2013   BILITOT 0.80 03/25/2012   ALKPHOS 87 11/02/2014   ALKPHOS 51 07/02/2013   ALKPHOS 84 03/25/2012   AST 19 11/02/2014   AST 23 07/02/2013    AST 33 03/25/2012   ALT 27 11/02/2014   ALT 34 07/02/2013   ALT 43 03/25/2012   PROT 7.2 11/02/2014   PROT 5.8* 07/02/2013   PROT 7.4 03/25/2012   ALBUMIN 3.9 11/02/2014   ALBUMIN 2.9* 07/02/2013   CALCIUM 8.6 11/23/2014   CALCIUM 9.3 11/02/2014   CALCIUM 8.6 03/25/2012   ANIONGAP 9 11/23/2014   ANIONGAP 10 11/02/2014    Impression:  The patient is recovering from the effects of radiation.    Plan:  Will try Ultram for pain.  MRI in 2 months then follow-up.  _____________________________________  Sheral Apley. Tammi Klippel, M.D.

## 2015-02-06 ENCOUNTER — Other Ambulatory Visit: Payer: Self-pay | Admitting: Radiation Therapy

## 2015-02-06 DIAGNOSIS — C7931 Secondary malignant neoplasm of brain: Secondary | ICD-10-CM

## 2015-03-06 ENCOUNTER — Other Ambulatory Visit (HOSPITAL_BASED_OUTPATIENT_CLINIC_OR_DEPARTMENT_OTHER): Payer: BLUE CROSS/BLUE SHIELD

## 2015-03-06 ENCOUNTER — Ambulatory Visit (HOSPITAL_COMMUNITY): Payer: BC Managed Care – PPO

## 2015-03-06 ENCOUNTER — Ambulatory Visit (HOSPITAL_COMMUNITY)
Admission: RE | Admit: 2015-03-06 | Discharge: 2015-03-06 | Disposition: A | Discharge status: Home / Self Care | Payer: BLUE CROSS/BLUE SHIELD | Source: Ambulatory Visit | Attending: Internal Medicine | Admitting: Internal Medicine

## 2015-03-06 DIAGNOSIS — R918 Other nonspecific abnormal finding of lung field: Secondary | ICD-10-CM | POA: Diagnosis not present

## 2015-03-06 DIAGNOSIS — Z9221 Personal history of antineoplastic chemotherapy: Secondary | ICD-10-CM | POA: Insufficient documentation

## 2015-03-06 DIAGNOSIS — Z902 Acquired absence of lung [part of]: Secondary | ICD-10-CM | POA: Diagnosis not present

## 2015-03-06 DIAGNOSIS — C3412 Malignant neoplasm of upper lobe, left bronchus or lung: Secondary | ICD-10-CM | POA: Diagnosis not present

## 2015-03-06 DIAGNOSIS — Z08 Encounter for follow-up examination after completed treatment for malignant neoplasm: Secondary | ICD-10-CM | POA: Diagnosis present

## 2015-03-06 LAB — CBC WITH DIFFERENTIAL/PLATELET
BASO%: 0.2 % (ref 0.0–2.0)
Basophils Absolute: 0 10*3/uL (ref 0.0–0.1)
EOS%: 2.7 % (ref 0.0–7.0)
Eosinophils Absolute: 0.3 10*3/uL (ref 0.0–0.5)
HEMATOCRIT: 42.4 % (ref 38.4–49.9)
HEMOGLOBIN: 13.8 g/dL (ref 13.0–17.1)
LYMPH%: 35.4 % (ref 14.0–49.0)
MCH: 27 pg — ABNORMAL LOW (ref 27.2–33.4)
MCHC: 32.5 g/dL (ref 32.0–36.0)
MCV: 82.8 fL (ref 79.3–98.0)
MONO#: 0.9 10*3/uL (ref 0.1–0.9)
MONO%: 9.1 % (ref 0.0–14.0)
NEUT#: 5.2 10*3/uL (ref 1.5–6.5)
NEUT%: 52.6 % (ref 39.0–75.0)
PLATELETS: 270 10*3/uL (ref 140–400)
RBC: 5.12 10*6/uL (ref 4.20–5.82)
RDW: 14.1 % (ref 11.0–14.6)
WBC: 10 10*3/uL (ref 4.0–10.3)
lymph#: 3.5 10*3/uL — ABNORMAL HIGH (ref 0.9–3.3)

## 2015-03-06 LAB — COMPREHENSIVE METABOLIC PANEL (CC13)
ALT: 24 U/L (ref 0–55)
ANION GAP: 12 meq/L — AB (ref 3–11)
AST: 19 U/L (ref 5–34)
Albumin: 3.7 g/dL (ref 3.5–5.0)
Alkaline Phosphatase: 93 U/L (ref 40–150)
BUN: 14.2 mg/dL (ref 7.0–26.0)
CHLORIDE: 106 meq/L (ref 98–109)
CO2: 22 mEq/L (ref 22–29)
Calcium: 9.4 mg/dL (ref 8.4–10.4)
Creatinine: 1.5 mg/dL — ABNORMAL HIGH (ref 0.7–1.3)
EGFR: 52 mL/min/{1.73_m2} — ABNORMAL LOW (ref >90)
GLUCOSE: 95 mg/dL (ref 70–140)
POTASSIUM: 5.2 meq/L — AB (ref 3.5–5.1)
Sodium: 140 mEq/L (ref 136–145)
TOTAL PROTEIN: 6.8 g/dL (ref 6.4–8.3)
Total Bilirubin: 0.37 mg/dL (ref 0.20–1.20)

## 2015-03-10 ENCOUNTER — Ambulatory Visit
Admission: RE | Admit: 2015-03-10 | Discharge: 2015-03-10 | Disposition: A | Discharge status: Home / Self Care | Payer: BLUE CROSS/BLUE SHIELD | Source: Ambulatory Visit | Attending: Radiation Oncology | Admitting: Radiation Oncology

## 2015-03-10 DIAGNOSIS — C7931 Secondary malignant neoplasm of brain: Secondary | ICD-10-CM

## 2015-03-10 MED ORDER — GADOBENATE DIMEGLUMINE 529 MG/ML IV SOLN
20.0000 mL | Freq: Once | INTRAVENOUS | Status: AC | PRN
  Administered 2015-03-10: 20 mL via INTRAVENOUS

## 2015-03-12 ENCOUNTER — Encounter: Payer: Self-pay | Admitting: Radiation Therapy

## 2015-03-13 ENCOUNTER — Other Ambulatory Visit: Payer: Self-pay | Admitting: Radiation Therapy

## 2015-03-13 ENCOUNTER — Encounter: Payer: Self-pay | Admitting: Internal Medicine

## 2015-03-13 ENCOUNTER — Encounter: Payer: Self-pay | Admitting: Radiation Oncology

## 2015-03-13 ENCOUNTER — Ambulatory Visit (HOSPITAL_BASED_OUTPATIENT_CLINIC_OR_DEPARTMENT_OTHER): Payer: BLUE CROSS/BLUE SHIELD | Admitting: Internal Medicine

## 2015-03-13 ENCOUNTER — Telehealth: Payer: Self-pay | Admitting: Internal Medicine

## 2015-03-13 ENCOUNTER — Ambulatory Visit
Admission: RE | Admit: 2015-03-13 | Discharge: 2015-03-13 | Disposition: A | Discharge status: Home / Self Care | Payer: BLUE CROSS/BLUE SHIELD | Source: Ambulatory Visit | Attending: Radiation Oncology | Admitting: Radiation Oncology

## 2015-03-13 VITALS — BP 150/94 | HR 80 | Temp 98.1°F | Resp 18 | Ht 73.0 in | Wt 267.3 lb

## 2015-03-13 VITALS — BP 165/86 | HR 87 | Resp 16 | Wt 268.6 lb

## 2015-03-13 DIAGNOSIS — M25512 Pain in left shoulder: Secondary | ICD-10-CM

## 2015-03-13 DIAGNOSIS — C3432 Malignant neoplasm of lower lobe, left bronchus or lung: Secondary | ICD-10-CM

## 2015-03-13 DIAGNOSIS — C7931 Secondary malignant neoplasm of brain: Secondary | ICD-10-CM | POA: Diagnosis not present

## 2015-03-13 DIAGNOSIS — I10 Essential (primary) hypertension: Secondary | ICD-10-CM

## 2015-03-13 DIAGNOSIS — J449 Chronic obstructive pulmonary disease, unspecified: Secondary | ICD-10-CM | POA: Diagnosis not present

## 2015-03-13 DIAGNOSIS — C3492 Malignant neoplasm of unspecified part of left bronchus or lung: Secondary | ICD-10-CM

## 2015-03-13 NOTE — Telephone Encounter (Signed)
per pof to sch pt appt-adv pt that [facility] to [facility] scan-gave pt copy of sch

## 2015-03-13 NOTE — Progress Notes (Signed)
[facility] [phone]   [phone]) [phone]  OFFICE PROGRESS NOTE  [name], MD Dr. 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  2) metastatic non-small cell lung cancer, adenocarcinoma initially diagnosed as stage IIB (T3, N1, M0) in June of 2014.   PRIOR THERAPY:  1) Status post 3 cycles of systemic chemotherapy with carboplatin and etoposide. Last dose was given on 07/16/2011.  2) Left video-assisted thoracoscopy, mini thoracotomy; wedge, posterior segment of left upper lobe; wedge, anterior segment of left lower lobe with node dissection.  3) Redo left video-assisted thoracoscopy, lysis of adhesions, wedge resection of left lower lobe, thoracoscopic left lower lobectomy under the care of Dr. Roxan Hockey on 05/03/2013.  4) Adjuvant chemotherapy with cisplatin 75 mg/M2 and Alimta 500 mg/M2 every 3 weeks, first dose given 06/28/2013. Status post 4 cycles. 5) status post stereotactic radiotherapy as well as craniotomy with tumor excision under the care of Dr. Tammi Klippel and Dr. Vertell Limber on 11/28/2014 and the final pathology was consistent with metastatic adenocarcinoma of lung primary.  CURRENT THERAPY: Observation.  CHEMOTHERAPY INTENT: Adjuvant/Curative.  CURRENT # OF CHEMOTHERAPY CYCLES: 0 CURRENT ANTIEMETICS: N/A  CURRENT SMOKING STATUS: Quit smoking 05/02/13  ORAL CHEMOTHERAPY AND CONSENT: None  CURRENT BISPHOSPHONATES USE: None  PAIN MANAGEMENT: 2/10  NARCOTICS INDUCED CONSTIPATION: None  LIVING WILL AND CODE STATUS: Full Code  INTERVAL HISTORY: [name] [name] 59 y.o. male returns to the clinic today for 3 months followup visit accompanied by his daughter. The patient is feeling fine today with no specific complaints except for pain on the left chest wall as well as a left shoulder. He was seen  by Dr. Maxie Better in the past and he will try to schedule an appointment with him for evaluation of his left shoulder pain. The patient denied having any fever or chills. He denied having any shortness of breath, cough or hemoptysis. He has no weight loss or night sweats. He has repeat CT scan of the chest performed recently and he is here for evaluation and discussion of his scan results.  MEDICAL HISTORY: Past Medical History  Diagnosis Date  . Hypercholesteremia     takes Pravastatin nightly  . HTN (hypertension)     takes Lisinopril and HCTZ daily  . Shortness of breath     with exertion;Albuterol inhaler prn  . Insomnia     takes Xanax prn  . Lung cancer dx'd 2012  . Brain cancer   . Anxiety   . Arthritis   . COPD (chronic obstructive pulmonary disease)     ALLERGIES:  has No Known Allergies.  MEDICATIONS:  Current Outpatient Prescriptions  Medication Sig Dispense Refill  . albuterol (PROVENTIL HFA;VENTOLIN HFA) 108 (90 BASE) MCG/ACT inhaler Inhale 1-2 puffs into the lungs every 4 (four) hours as needed for wheezing.    Marland Kitchen albuterol (PROVENTIL) (2.5 MG/3ML) 0.083% nebulizer solution Take 3 mLs (2.5 mg total) by nebulization every 4 (four) hours as needed for wheezing or shortness of breath. 75 mL 12  . benzonatate (TESSALON PERLES) 100 MG capsule Take 1 capsule (100 mg total) by mouth 3 (three) times daily as needed for cough. (Patient not taking: Reported on 01/09/2015) 30 capsule 0  . diazepam (VALIUM) 5 MG tablet   0  . lisinopril-hydrochlorothiazide (PRINZIDE,ZESTORETIC) 20-25 MG per tablet Take 1 tablet by mouth every  morning.     Marland Kitchen oxyCODONE-acetaminophen (PERCOCET/ROXICET) 5-325 MG per tablet   0  . pravastatin (PRAVACHOL) 40 MG tablet Take 40 mg by mouth at bedtime.     . Tiotropium Bromide-Olodaterol (STIOLTO RESPIMAT) 2.5-2.5 MCG/ACT AERS Inhale 2 puffs into the lungs daily. (Patient not taking: Reported on 03/13/2015) 4 g 5  . traMADol (ULTRAM) 50 MG tablet Take 1 tablet (50 mg  total) by mouth every 6 (six) hours as needed. (Patient not taking: Reported on 03/13/2015) 120 tablet 0   No current facility-administered medications for this visit.    SURGICAL HISTORY:  Past Surgical History  Procedure Laterality Date  . Left video-assisted thoracoscopy, mini thoracotomy; wedge, posterior segment of left upper lobe; wedge, anterior segment of left lower lobe with node dissection.  09/02/11    [facility]  . Video assisted thoracoscopy Left 05/03/2013    Procedure: REDO VIDEO ASSISTED THORACOSCOPY;  Surgeon: Melrose Nakayama, MD;  Location: [facility];  Service: Thoracic;  Laterality: Left;  REDO LEFT VATS  . Thoracotomy Left 05/03/2013    Procedure: THORACOTOMY MAJOR;  Surgeon: Melrose Nakayama, MD;  Location: [facility];  Service: Thoracic;  Laterality: Left;  . Lobectomy Left 05/03/2013    Procedure: LOBECTOMY;  Surgeon: Melrose Nakayama, MD;  Location: [facility];  Service: Thoracic;  Laterality: Left;  . Wedge resection Left 05/03/2013    Procedure: WEDGE RESECTION;  Surgeon: Melrose Nakayama, MD;  Location: [facility];  Service: Thoracic;  Laterality: Left;  . Open reduction internal fixation (orif) tibia/fibula fracture Right 07/01/2013    Procedure: OPEN REDUCTION INTERNAL FIXATION TIBIAL  FRACTURE;  Surgeon: Johnn Hai, MD;  Location: [facility];  Service: Orthopedics;  Laterality: Right;  . Craniotomy N/A 11/28/2014    Procedure: CRANIOTOMY TUMOR EXCISION;  Surgeon: Erline Levine, MD;  Location: [facility];  Service: Neurosurgery;  Laterality: N/A;    REVIEW OF SYSTEMS:  A comprehensive review of systems was negative except for: Constitutional: positive for fatigue Musculoskeletal: positive for bone pain   PHYSICAL EXAMINATION: General appearance: alert, cooperative, fatigued and no distress Head: Normocephalic, without obvious abnormality, atraumatic Neck: no adenopathy, no JVD, supple, symmetrical, trachea midline and thyroid not enlarged, symmetric, no  tenderness/mass/nodules Lymph nodes: Cervical, supraclavicular, and axillary nodes normal. Resp: clear to auscultation bilaterally Back: symmetric, no curvature. ROM normal. No CVA tenderness. Cardio: regular rate and rhythm, S1, S2 normal, no murmur, click, rub or gallop GI: soft, non-tender; bowel sounds normal; no masses,  no organomegaly Extremities: extremities normal, atraumatic, no cyanosis or edema Neurologic: Alert and oriented X 3, normal strength and tone. Normal symmetric reflexes. Normal coordination and gait  ECOG PERFORMANCE STATUS: 1 - Symptomatic but completely ambulatory  Blood pressure 150/94, pulse 80, temperature 98.1 F (36.7 C), temperature source Oral, resp. rate 18, height '6\' 1"'$  (1.854 m), weight 267 lb 4.8 oz (121.246 kg), SpO2 100 %.  LABORATORY DATA: Lab Results  Component Value Date   WBC 10.0 03/06/2015   HGB 13.8 03/06/2015   HCT 42.4 03/06/2015   MCV 82.8 03/06/2015   PLT 270 03/06/2015      Chemistry      Component Value Date/Time   NA 140 03/06/2015 1008   NA 131* 11/23/2014 1003   NA 138 03/25/2012 1225   K 5.2* 03/06/2015 1008   K 4.2 11/23/2014 1003   K 4.2 03/25/2012 1225   CL 97 11/23/2014 1003   CL 105 03/03/2013 1002   CL 98 03/25/2012 1225  CO2 22 03/06/2015 1008   CO2 25 11/23/2014 1003   CO2 26 03/25/2012 1225   BUN 14.2 03/06/2015 1008   BUN 38* 11/23/2014 1003   BUN 11 03/25/2012 1225   CREATININE 1.5* 03/06/2015 1008   CREATININE 1.67* 11/23/2014 1003   CREATININE 1.0 03/25/2012 1225      Component Value Date/Time   CALCIUM 9.4 03/06/2015 1008   CALCIUM 8.6 11/23/2014 1003   CALCIUM 8.6 03/25/2012 1225   ALKPHOS 93 03/06/2015 1008   ALKPHOS 51 07/02/2013 0455   ALKPHOS 84 03/25/2012 1225   AST 19 03/06/2015 1008   AST 23 07/02/2013 0455   AST 33 03/25/2012 1225   ALT 24 03/06/2015 1008   ALT 34 07/02/2013 0455   ALT 43 03/25/2012 1225   BILITOT 0.37 03/06/2015 1008   BILITOT 0.5 07/02/2013 0455   BILITOT 0.80  03/25/2012 1225       RADIOGRAPHIC STUDIES: Ct Chest Wo Contrast  03/06/2015   CLINICAL DATA:  History of left upper lobe lung cancer status post surgery x2. Chemotherapy completed 2 years ago. Restaging. Subsequent encounter.  EXAM: CT CHEST WITHOUT CONTRAST  TECHNIQUE: Multidetector CT imaging of the chest was performed following the standard protocol without IV contrast.  COMPARISON:  Portable chest 11/28/2014. Chest CTs 06/23/2014 and 11/02/2014.  FINDINGS: Mediastinum/Nodes: There are no enlarged mediastinal, hilar or axillary lymph nodes. Hilar assessment is limited by the lack of intravenous contrast, although the hilar contours are stable. The trachea, esophagus and thyroid gland demonstrate no significant findings. The heart size is normal. There is no pericardial effusion.Mild atherosclerosis of the aorta, great vessels coronary arteries noted.  Lungs/Pleura: There is no pleural effusion. There are stable postsurgical changes in the left lung status post lower lobe resection and wedge resection in the upper lobe. Left infrahilar soft tissue thickening is stable. Multiple ground-glass opacities throughout the right lung are stable, the largest in the right upper lobe measuring 13 mm on image 24. No developing solid components identified. 4 mm right lower lobe nodule on image 44 is stable.  Upper abdomen: Hepatic steatosis noted. No evidence of adrenal mass.  Musculoskeletal/Chest wall: There is no chest wall mass or suspicious osseous finding. Asymmetric gynecomastia noted on the left.  IMPRESSION: 1. Stable postoperative appearance of the chest without evidence of local recurrence or metastatic disease. 2. Stable multifocal ground-glass opacities throughout the right lung, suspicious for atypical adenomatous hyperplasia. No developing solid components identified. Continued follow up recommended. 3. Atherosclerosis and hepatic steatosis noted.   Electronically Signed   By: Richardean Sale M.D.   On:  03/06/2015 11:32   Mr Jeri Cos OE Contrast  03/10/2015   CLINICAL DATA:  Lung cancer.  EXAM: MRI HEAD WITHOUT AND WITH CONTRAST  TECHNIQUE: Multiplanar, multiecho pulse sequences of the brain and surrounding structures were obtained without and with intravenous contrast.  CONTRAST:  105m MULTIHANCE GADOBENATE DIMEGLUMINE 529 MG/ML IV SOLN  COMPARISON:  I of the head without and with contrast 11/29/2014 and 11/18/2014.  FINDINGS: A right occipital craniotomy is again noted. There is further collapse of the surgical cavity. The overall area of enhancement is similar to the prior exam measuring 2.2 x 1.8 x 1.0 cm within the left parietal lobe.  There is also collapse of the surgical cavity in the left cerebellum. The overall extent of enhancement is similar to the prior study measuring 3.4 x 2.8 x 1.5 m however come there is enhancement extending through the craniotomy site and involving the soft  tissues posterior to the occiput. This is likely inflammatory.  No new areas of parenchymal enhancement are present. 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  T2 changes surrounding the right parietal lesion have significantly decreased. There is also reduction of T2 changes surrounding the left cerebellar lesion. This area is not reduced to the same extent is the right parietal area. There is no mass effect on the fourth ventricle or adjacent posterior fossa structures.  Mild generalized atrophy is noted.  Flow is present in the major intracranial arteries. The globes and orbits are intact.  Mild mucosal thickening is present in the left maxillary sinus. There is minimal fluid in the inferior mastoids bilaterally. No obstructing nasopharyngeal lesion is evident.  IMPRESSION: 1. Stable extent enhancement with further collapse of the surgical cavity in the right parietal lobe. Surrounding T2 changes has significantly improved. This represents a positive response to therapy. Residual  enhancement may be postoperative rather than represent residual or recurrent tumor. 2. Stable appearance of residual enhancing tissue in the left cerebellum. Surrounding T2 changes have decreased as well but to a lesser extent than in the right parietal lobe. This may represent postoperative changes although residual tumor cannot be excluded. Recommend continued attention to this area. 3. Enhancement through the craniotomy site in within the suboccipital soft tissues is likely inflammatory. There is no discrete abscess or fluid collection. 4. Mild generalized atrophy. 5. 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   Electronically Signed   By: San Morelle M.D.   On: 03/10/2015 14:04    ASSESSMENT AND PLAN: This is a very pleasant 59 years old white male with   1) metastatic non-small cell lung cancer initially diagnosed as Stage IIB, adenocarcinoma status post resection and completed 4 cycles of adjuvant chemotherapy with cisplatin and Alimta.  He was found in January 2016 to have evidence for metastatic brain lesions and the patient underwent stereotactic radiotherapy as well as craniotomy with resection of the tumor. His recent CT scan of the chest showed no significant evidence for disease progression. I discussed the scan results with the patient and his daughter. I recommended for him to continue on observation with repeat CT scan of the chest in 3 months.  2) metastatic brain lesion: Status post a stereotactic radiotherapy and craniotomy. He is currently on observation under the care of Dr. Tammi Klippel.  3) COPD: Followed by Dr. Lamonte Sakai and he is currently on Spiriva and albuterol inhaler and nebulizers.  4) hypertension: Continue lisinopril/HCTZ.  5) left shoulder pain: The patient was advised to consult with his orthopedic surgeon for evaluation of this condition.  He was advised to call immediately if he has any concerning symptoms in the interval. The patient voices understanding of current  disease status and treatment options and is in agreement with the current care plan.  All questions were answered. The patient knows to call the clinic with any problems, questions or concerns. We can certainly see the patient much sooner if necessary.  Disclaimer: This note was dictated with voice recognition software. Similar sounding words can inadvertently be transcribed and may not be corrected upon review.

## 2015-03-13 NOTE — Progress Notes (Signed)
BP slightly elevated. Denies taking any medication at this time. Denies seizure activity. Denies headache, dizziness, nausea, vomiting, diplopia or ringing in ears. Reports for the last three days he has experienced some posterior neck discomfort and feels like this same area is swollen. Steady gait noted. Weight stable. Denies seizure activity.

## 2015-03-13 NOTE — Progress Notes (Signed)
Radiation Oncology         [phone] ________________________________  Name: LADERRICK WILK MRN: [id number]  Date: 03/13/2015  DOB: September 09, 1956  Follow-Up Visit Note  CC: [name], MD  Curt Bears, MD     Diagnosis:   59 yo man with 2 brain metastases s/p pre-op SRS - 11/24/2014 (Right parietal 3.3 cm met treated to 15 Gy, and Left cerebellar 3 cm met treated to 18 Gy)  Interval Since Last Radiation:  3 Months  Narrative:  The patient returns today for routine follow-up. BP slightly elevated. Denies taking any medication at this time. Denies seizure activity. Denies headache, dizziness, nausea, vomiting, diplopia or ringing in ears. Reports for the last three days he has experienced some posterior neck discomfort and feels like this same area is swollen. Steady gait noted. Weight stable. Denies seizure activity.                               ALLERGIES:  has No Known Allergies.  Meds: Current Outpatient Prescriptions  Medication Sig Dispense Refill  . albuterol (PROVENTIL HFA;VENTOLIN HFA) 108 (90 BASE) MCG/ACT inhaler Inhale 1-2 puffs into the lungs every 4 (four) hours as needed for wheezing.    Marland Kitchen albuterol (PROVENTIL) (2.5 MG/3ML) 0.083% nebulizer solution Take 3 mLs (2.5 mg total) by nebulization every 4 (four) hours as needed for wheezing or shortness of breath. (Patient not taking: Reported on 03/13/2015) 75 mL 12  . ALPRAZolam (XANAX) 0.25 MG tablet Take 1 tablet (0.25 mg total) by mouth 3 (three) times daily as needed for anxiety. (Patient not taking: Reported on 03/13/2015) 30 tablet 0  . benzonatate (TESSALON PERLES) 100 MG capsule Take 1 capsule (100 mg total) by mouth 3 (three) times daily as needed for cough. (Patient not taking: Reported on 01/09/2015) 30 capsule 0  . dexamethasone (DECADRON) 4 MG tablet Take 1 tablet (4 mg total) by mouth 3 (three) times daily. (Patient not taking: Reported on 01/09/2015) 45 tablet 0  . diazepam (VALIUM) 5 MG tablet   0  .  lisinopril-hydrochlorothiazide (PRINZIDE,ZESTORETIC) 20-25 MG per tablet Take 1 tablet by mouth every morning.     Marland Kitchen LORazepam (ATIVAN) 1 MG tablet Take 1 mg by mouth daily as needed for anxiety (Takes when having procedures).    Marland Kitchen oxyCODONE-acetaminophen (PERCOCET/ROXICET) 5-325 MG per tablet   0  . pravastatin (PRAVACHOL) 40 MG tablet Take 40 mg by mouth at bedtime.     . Tiotropium Bromide-Olodaterol (STIOLTO RESPIMAT) 2.5-2.5 MCG/ACT AERS Inhale 2 puffs into the lungs daily. (Patient not taking: Reported on 03/13/2015) 4 g 5  . traMADol (ULTRAM) 50 MG tablet Take 1 tablet (50 mg total) by mouth every 6 (six) hours as needed. (Patient not taking: Reported on 03/13/2015) 120 tablet 0   No current facility-administered medications for this encounter.    Physical Findings: The patient is in no acute distress. Patient is alert and oriented.  weight is 268 lb 9.6 oz (121.836 kg). His blood pressure is 165/86 and his pulse is 87. His respiration is 16. .  No significant changes.  Surgical site showed no erythremia or warmth or tenderness.   Lab Findings: Lab Results  Component Value Date   WBC 10.0 03/06/2015   WBC 12.4* 11/23/2014   HGB 13.8 03/06/2015   HGB 13.6 11/23/2014   HCT 42.4 03/06/2015   HCT 40.7 11/23/2014   PLT 270 03/06/2015   PLT 210 11/23/2014  Lab Results  Component Value Date   NA 140 03/06/2015   NA 131* 11/23/2014   NA 138 03/25/2012   K 5.2* 03/06/2015   K 4.2 11/23/2014   K 4.2 03/25/2012   CHLORIDE 106 03/06/2015   CO2 22 03/06/2015   CO2 25 11/23/2014   CO2 26 03/25/2012   GLUCOSE 95 03/06/2015   GLUCOSE 113* 11/23/2014   GLUCOSE 107* 03/03/2013   GLUCOSE 99 03/25/2012   BUN 14.2 03/06/2015   BUN 38* 11/23/2014   BUN 11 03/25/2012   CREATININE 1.5* 03/06/2015   CREATININE 1.67* 11/23/2014   CREATININE 1.0 03/25/2012   BILITOT 0.37 03/06/2015   BILITOT 0.5 07/02/2013   BILITOT 0.80 03/25/2012   ALKPHOS 93 03/06/2015   ALKPHOS 51 07/02/2013    ALKPHOS 84 03/25/2012   AST 19 03/06/2015   AST 23 07/02/2013   AST 33 03/25/2012   ALT 24 03/06/2015   ALT 34 07/02/2013   ALT 43 03/25/2012   PROT 6.8 03/06/2015   PROT 5.8* 07/02/2013   PROT 7.4 03/25/2012   ALBUMIN 3.7 03/06/2015   ALBUMIN 2.9* 07/02/2013   CALCIUM 9.4 03/06/2015   CALCIUM 8.6 11/23/2014   CALCIUM 8.6 03/25/2012   ANIONGAP 12* 03/06/2015   ANIONGAP 9 11/23/2014    Radiographic Findings: Ct Chest Wo Contrast  03/06/2015   CLINICAL DATA:  History of left upper lobe lung cancer status post surgery x2. Chemotherapy completed 2 years ago. Restaging. Subsequent encounter.  EXAM: CT CHEST WITHOUT CONTRAST  TECHNIQUE: Multidetector CT imaging of the chest was performed following the standard protocol without IV contrast.  COMPARISON:  Portable chest 11/28/2014. Chest CTs 06/23/2014 and 11/02/2014.  FINDINGS: Mediastinum/Nodes: There are no enlarged mediastinal, hilar or axillary lymph nodes. Hilar assessment is limited by the lack of intravenous contrast, although the hilar contours are stable. The trachea, esophagus and thyroid gland demonstrate no significant findings. The heart size is normal. There is no pericardial effusion.Mild atherosclerosis of the aorta, great vessels coronary arteries noted.  Lungs/Pleura: There is no pleural effusion. There are stable postsurgical changes in the left lung status post lower lobe resection and wedge resection in the upper lobe. Left infrahilar soft tissue thickening is stable. Multiple ground-glass opacities throughout the right lung are stable, the largest in the right upper lobe measuring 13 mm on image 24. No developing solid components identified. 4 mm right lower lobe nodule on image 44 is stable.  Upper abdomen: Hepatic steatosis noted. No evidence of adrenal mass.  Musculoskeletal/Chest wall: There is no chest wall mass or suspicious osseous finding. Asymmetric gynecomastia noted on the left.  IMPRESSION: 1. Stable postoperative  appearance of the chest without evidence of local recurrence or metastatic disease. 2. Stable multifocal ground-glass opacities throughout the right lung, suspicious for atypical adenomatous hyperplasia. No developing solid components identified. Continued follow up recommended. 3. Atherosclerosis and hepatic steatosis noted.   Electronically Signed   By: Richardean Sale M.D.   On: 03/06/2015 11:32   Mr Jeri Cos SE Contrast  03/10/2015   CLINICAL DATA:  Lung cancer.  EXAM: MRI HEAD WITHOUT AND WITH CONTRAST  TECHNIQUE: Multiplanar, multiecho pulse sequences of the brain and surrounding structures were obtained without and with intravenous contrast.  CONTRAST:  72m MULTIHANCE GADOBENATE DIMEGLUMINE 529 MG/ML IV SOLN  COMPARISON:  I of the head without and with contrast 11/29/2014 and 11/18/2014.  FINDINGS: A right occipital craniotomy is again noted. There is further collapse of the surgical cavity. The overall area of enhancement  is similar to the prior exam measuring 2.2 x 1.8 x 1.0 cm within the left parietal lobe.  There is also collapse of the surgical cavity in the left cerebellum. The overall extent of enhancement is similar to the prior study measuring 3.4 x 2.8 x 1.5 m however come there is enhancement extending through the craniotomy site and involving the soft tissues posterior to the occiput. This is likely inflammatory.  No new areas of parenchymal enhancement are present. 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  T2 changes surrounding the right parietal lesion have significantly decreased. There is also reduction of T2 changes surrounding the left cerebellar lesion. This area is not reduced to the same extent is the right parietal area. There is no mass effect on the fourth ventricle or adjacent posterior fossa structures.  Mild generalized atrophy is noted.  Flow is present in the major intracranial arteries. The globes and orbits are intact.  Mild mucosal  thickening is present in the left maxillary sinus. There is minimal fluid in the inferior mastoids bilaterally. No obstructing nasopharyngeal lesion is evident.  IMPRESSION: 1. Stable extent enhancement with further collapse of the surgical cavity in the right parietal lobe. Surrounding T2 changes has significantly improved. This represents a positive response to therapy. Residual enhancement may be postoperative rather than represent residual or recurrent tumor. 2. Stable appearance of residual enhancing tissue in the left cerebellum. Surrounding T2 changes have decreased as well but to a lesser extent than in the right parietal lobe. This may represent postoperative changes although residual tumor cannot be excluded. Recommend continued attention to this area. 3. Enhancement through the craniotomy site in within the suboccipital soft tissues is likely inflammatory. There is no discrete abscess or fluid collection. 4. Mild generalized atrophy. 5. 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   Electronically Signed   By: San Morelle M.D.   On: 03/10/2015 14:04   Impression:  The patient is recovering from the effects of radiation.  MRI scan shows increased enhancement around surgical site in the cerebellum. This may represent some persistant tumor or post-op inflammation. Difficult to distinguish. Close observation is warranted.   Plan:  Follow-up in 6 weeks with brain MRI  _____________________________________  Sheral Apley. Tammi Klippel, M.D.   This document serves as a record of services personally performed by Tyler Pita, MD. It was created on his behalf by Derek Mound, a trained medical scribe. The creation of this record is based on the scribe's personal observations and the provider's statements to them. This document has been checked and approved by the attending provider.

## 2015-03-20 ENCOUNTER — Telehealth: Payer: Self-pay | Admitting: Radiation Oncology

## 2015-03-20 NOTE — Telephone Encounter (Signed)
Received word from [place] that the patient's significant other called her reporting that the patient complains of neck pain that radiates down his left arm. Phoned patient. No answer. Left message requesting return call. Per Dr. Worthy Flank note on 5/2 the patient was going to reach out to Dr. Maxie Better reference this same pain.

## 2015-03-22 ENCOUNTER — Other Ambulatory Visit: Payer: Self-pay | Admitting: Radiation Therapy

## 2015-03-22 DIAGNOSIS — C7931 Secondary malignant neoplasm of brain: Secondary | ICD-10-CM

## 2015-04-11 ENCOUNTER — Ambulatory Visit
Admission: RE | Admit: 2015-04-11 | Discharge: 2015-04-11 | Disposition: A | Discharge status: Home / Self Care | Payer: BLUE CROSS/BLUE SHIELD | Source: Ambulatory Visit | Attending: Radiation Oncology | Admitting: Radiation Oncology

## 2015-04-11 DIAGNOSIS — C7931 Secondary malignant neoplasm of brain: Secondary | ICD-10-CM

## 2015-04-11 MED ORDER — GADOBENATE DIMEGLUMINE 529 MG/ML IV SOLN: 20.0000 mL | Freq: Once | INTRAVENOUS | Status: AC | PRN

## 2015-04-11 MED ORDER — GADOBENATE DIMEGLUMINE 529 MG/ML IV SOLN
20.0000 mL | Freq: Once | INTRAVENOUS | Status: AC | PRN
  Administered 2015-04-11: 20 mL via INTRAVENOUS

## 2015-04-13 ENCOUNTER — Other Ambulatory Visit: Payer: Self-pay | Admitting: *Deleted

## 2015-04-13 DIAGNOSIS — C7949 Secondary malignant neoplasm of other parts of nervous system: Principal | ICD-10-CM

## 2015-04-13 DIAGNOSIS — C7931 Secondary malignant neoplasm of brain: Secondary | ICD-10-CM

## 2015-04-20 ENCOUNTER — Ambulatory Visit: Payer: BLUE CROSS/BLUE SHIELD

## 2015-04-21 ENCOUNTER — Ambulatory Visit
Admission: RE | Admit: 2015-04-21 | Discharge: 2015-04-21 | Disposition: A | Discharge status: Home / Self Care | Payer: BLUE CROSS/BLUE SHIELD | Source: Ambulatory Visit | Attending: Radiation Oncology | Admitting: Radiation Oncology

## 2015-04-21 DIAGNOSIS — C7931 Secondary malignant neoplasm of brain: Secondary | ICD-10-CM | POA: Diagnosis present

## 2015-04-21 DIAGNOSIS — C7949 Secondary malignant neoplasm of other parts of nervous system: Secondary | ICD-10-CM

## 2015-04-21 LAB — BUN AND CREATININE (CC13)
BUN: 18.5 mg/dL (ref 7.0–26.0)
Creatinine: 1.4 mg/dL — ABNORMAL HIGH (ref 0.7–1.3)
EGFR: 55 mL/min/{1.73_m2} — ABNORMAL LOW (ref >90)

## 2015-04-21 MED ORDER — GADOBENATE DIMEGLUMINE 529 MG/ML IV SOLN
20.0000 mL | Freq: Once | INTRAVENOUS | Status: AC | PRN
Start: 2015-04-21 — End: 2015-04-21
  Administered 2015-04-21: 20 mL via INTRAVENOUS

## 2015-04-24 ENCOUNTER — Ambulatory Visit: Payer: BLUE CROSS/BLUE SHIELD

## 2015-04-24 ENCOUNTER — Inpatient Hospital Stay
Admission: RE | Admit: 2015-04-24 | Payer: BLUE CROSS/BLUE SHIELD | Source: Ambulatory Visit | Admitting: Radiation Oncology

## 2015-04-24 ENCOUNTER — Ambulatory Visit: Payer: BLUE CROSS/BLUE SHIELD | Admitting: Radiation Oncology

## 2015-04-27 ENCOUNTER — Ambulatory Visit: Payer: BLUE CROSS/BLUE SHIELD | Admitting: Radiation Oncology

## 2015-04-28 ENCOUNTER — Other Ambulatory Visit: Payer: Self-pay

## 2015-04-28 ENCOUNTER — Encounter: Payer: Self-pay | Admitting: Radiation Oncology

## 2015-04-28 ENCOUNTER — Ambulatory Visit
Admission: RE | Admit: 2015-04-28 | Discharge: 2015-04-28 | Disposition: A | Discharge status: Home / Self Care | Payer: BLUE CROSS/BLUE SHIELD | Source: Ambulatory Visit | Attending: Radiation Oncology | Admitting: Radiation Oncology

## 2015-04-28 VITALS — BP 151/94 | HR 106 | Resp 16 | Wt 265.3 lb

## 2015-04-28 DIAGNOSIS — J449 Chronic obstructive pulmonary disease, unspecified: Secondary | ICD-10-CM

## 2015-04-28 DIAGNOSIS — C7931 Secondary malignant neoplasm of brain: Secondary | ICD-10-CM

## 2015-04-28 DIAGNOSIS — C3492 Malignant neoplasm of unspecified part of left bronchus or lung: Secondary | ICD-10-CM

## 2015-04-28 MED ORDER — ALBUTEROL SULFATE (2.5 MG/3ML) 0.083% IN NEBU: 2.5000 mg | INHALATION_SOLUTION | RESPIRATORY_TRACT | Status: AC | PRN

## 2015-04-28 MED ORDER — ALPRAZOLAM 0.25 MG PO TABS: 0.2500 mg | ORAL_TABLET | Freq: Every evening | ORAL | Status: AC | PRN

## 2015-04-28 NOTE — Progress Notes (Addendum)
BP slightly elevated. Denies pain. Reports the only medication he taking is his Stiolto. Denies any seizure activity . Denies headache, dizziness, nausea, vomiting, diplopia, or ringing in ears. Steady gait noted. Weight stable. Reports intermittent left occipital headaches.   BP 151/94 mmHg  Pulse 106  Resp 16  Wt 265 lb 4.8 oz (120.339 kg)   Wt Readings from Last 3 Encounters:  04/21/15 260 lb (117.935 kg)  03/13/15 267 lb 4.8 oz (121.246 kg)  03/13/15 268 lb 9.6 oz (121.836 kg)

## 2015-04-28 NOTE — Progress Notes (Signed)
Radiation Oncology         [phone] ________________________________  Name: [name] [name] MRN: [id number]  Date: 04/28/2015  DOB: Jun 04, 1956  Follow-Up Visit Note  CC: [name], MD  Curt Bears, MD     Diagnosis:   59 yo man with 2 brain metastases s/p pre-op SRS - 11/24/2014 (Right parietal 3.3 cm met treated to 15 Gy, and Left cerebellar 3 cm met treated to 18 Gy)  Interval Since Last Radiation:  5 Months (11/24/14)  Narrative:  The patient returns today for follow-up of his recent MRI.  During the patient's follow-up MRI in late April, he was noted to have increasing enhancement and possible recurrence of his resected left cerebellar brain metastasis. Accordingly, he was set up for short interval follow-up MRI last week. This study shows further progression of tumor in the left temporal region with enlargement of the enhancing component and a leptomeningeal pattern of growth along the cerebellar folia. He returns today to review the results of the study and potentially coordinate salvage treatment. The patient's only symptoms at this time are left occipital headache which is mild and intermittent in nature.  ALLERGIES:  has No Known Allergies.  Meds: Current Outpatient Prescriptions  Medication Sig Dispense Refill  . SPIRIVA HANDIHALER 18 MCG inhalation capsule   0  . Tiotropium Bromide-Olodaterol (STIOLTO RESPIMAT) 2.5-2.5 MCG/ACT AERS Inhale 2 puffs into the lungs daily. 4 g 5  . albuterol (PROVENTIL) (2.5 MG/3ML) 0.083% nebulizer solution Take 3 mLs (2.5 mg total) by nebulization every 4 (four) hours as needed for wheezing or shortness of breath. (Patient not taking: Reported on 04/28/2015) 75 mL 3  . benzonatate (TESSALON PERLES) 100 MG capsule Take 1 capsule (100 mg total) by mouth 3 (three) times daily as needed for cough. (Patient not taking: Reported on 01/09/2015) 30 capsule 0  . diazepam (VALIUM) 5 MG tablet   0  . lisinopril-hydrochlorothiazide  (PRINZIDE,ZESTORETIC) 20-25 MG per tablet Take 1 tablet by mouth every morning.     . pravastatin (PRAVACHOL) 40 MG tablet Take 40 mg by mouth at bedtime.     . traMADol (ULTRAM) 50 MG tablet Take 1 tablet (50 mg total) by mouth every 6 (six) hours as needed. (Patient not taking: Reported on 03/13/2015) 120 tablet 0   No current facility-administered medications for this encounter.    Physical Findings: The patient is in no acute distress. Patient is alert and oriented.  weight is 265 lb 4.8 oz (120.339 kg). His blood pressure is 151/94 and his pulse is 106. His respiration is 16. .  No significant changes.  Surgical site showed no erythremia or warmth or tenderness.   Lab Findings: Lab Results  Component Value Date   WBC 10.0 03/06/2015   WBC 12.4* 11/23/2014   HGB 13.8 03/06/2015   HGB 13.6 11/23/2014   HCT 42.4 03/06/2015   HCT 40.7 11/23/2014   PLT 270 03/06/2015   PLT 210 11/23/2014    Lab Results  Component Value Date   NA 140 03/06/2015   NA 131* 11/23/2014   NA 138 03/25/2012   K 5.2* 03/06/2015   K 4.2 11/23/2014   K 4.2 03/25/2012   CHLORIDE 106 03/06/2015   CO2 22 03/06/2015   CO2 25 11/23/2014   CO2 26 03/25/2012   GLUCOSE 95 03/06/2015   GLUCOSE 113* 11/23/2014   GLUCOSE 107* 03/03/2013   GLUCOSE 99 03/25/2012   BUN 18.5 04/21/2015   BUN 38* 11/23/2014   BUN 11 03/25/2012  CREATININE 1.4* 04/21/2015   CREATININE 1.67* 11/23/2014   CREATININE 1.0 03/25/2012   BILITOT 0.37 03/06/2015   BILITOT 0.5 07/02/2013   BILITOT 0.80 03/25/2012   ALKPHOS 93 03/06/2015   ALKPHOS 51 07/02/2013   ALKPHOS 84 03/25/2012   AST 19 03/06/2015   AST 23 07/02/2013   AST 33 03/25/2012   ALT 24 03/06/2015   ALT 34 07/02/2013   ALT 43 03/25/2012   PROT 6.8 03/06/2015   PROT 5.8* 07/02/2013   PROT 7.4 03/25/2012   ALBUMIN 3.7 03/06/2015   ALBUMIN 2.9* 07/02/2013   CALCIUM 9.4 03/06/2015   CALCIUM 8.6 11/23/2014   CALCIUM 8.6 03/25/2012   ANIONGAP 12* 03/06/2015    ANIONGAP 9 11/23/2014    Radiographic Findings: Mr Jeri Cos Wo Contrast  04/21/2015   CLINICAL DATA:  59 year old male with lung cancer, Two brain metastases s/p pre-op SRS - 11/24/2014 (Right parietal 3.3 cm met treated to 15 Gy, and Left cerebellar 3 cm met treated to 18 Gy). Subsequent encounter.  EXAM: MRI HEAD WITHOUT AND WITH CONTRAST  TECHNIQUE: Multiplanar, multiecho pulse sequences of the brain and surrounding structures were obtained without and with intravenous contrast.  CONTRAST:  32m MULTIHANCE GADOBENATE DIMEGLUMINE 529 MG/ML IV SOLN  COMPARISON:  Cervical spine MRI 04/11/2015. Brain MRI 04/09/2015 and earlier.  FINDINGS: Post resection and post treatment changes to the right parietal lobe re- identified. Stable oval mildly heterogeneous focus of residual enhancement encompassing 10-15 mm (series 12, image 115 today). Surrounding FLAIR signal abnormality compatible with vasogenic edema has progressed (series 9, image 58 today compared to series 7, image 49 previously). No significant regional mass effect.  Interval progression of abnormal lobulated and indistinct enhancement along the left cerebellar resection site also associated with increased cerebellar edema (series 9, image 30). Some of this abnormal cerebellar enhancement appears to be surface/leptomeningeal related. On axial images today this encompasses 45 x 22 mm (AP pad transverse previously 34 x 15 mm). There is also overlying scalp soft tissue enhancement which might be postoperative granulation over the craniectomy site, uncertain.  Also, a nearby small round 4-5 mm focus of enhancement along the inferior surface of the cerebellum has increased (series 12, image 38 and series 13, image 16 today). This is new since January.  No significant posterior fossa mass effect despite these findings.  No brand new brain metastasis identified.  Left superior frontal lobe developmental venous anomaly incidentally re- identified. Major intracranial  vascular flow voids are stable, including the left sigmoid sinus. No ventriculomegaly. No restricted diffusion or evidence of acute infarction. Negative pituitary, cervicomedullary junction and visualized cervical spinal cord. Bone marrow signal remains normal. No acute intracranial hemorrhage identified. Visible internal auditory structures appear normal. Mastoids are clear. Stable mild paranasal sinus mucosal thickening. Stable orbits soft tissues.  IMPRESSION: 1. Suspect progression of metastatic disease at the left cerebellar resection/treatment site. Some of the abnormal enhancement here appears to be surface/leptomeningeal, and there is an increasing small nodular focus of enhancement adjacent to the inferior craniectomy site favored to be a satellite metastasis. 2. Increased left cerebellar edema without significant posterior fossa mass effect at this time. 3. Stable enhancement at the right parietal resection/treatment site, but increased FLAIR signal compatible with edema. FBurtis Junesthis is post treatment related.   Electronically Signed   By: HGenevie AnnM.D.   On: 04/21/2015 11:41   Mr Cervical Spine W Wo Contrast  04/11/2015   CLINICAL DATA:  Left-sided neck pain radiating into the left arm  in between the shoulders. History of stage IV lung cancer.  EXAM: MRI CERVICAL SPINE WITHOUT AND WITH CONTRAST  TECHNIQUE: Multiplanar and multiecho pulse sequences of the cervical spine, to include the craniocervical junction and cervicothoracic junction, were obtained according to standard protocol without and with intravenous contrast.  CONTRAST:  45m MULTIHANCE GADOBENATE DIMEGLUMINE 529 MG/ML IV SOLN  COMPARISON:  No prior cervical spine MRI. Brain MRI 03/10/2015. PET-CT 03/03/2013.  FINDINGS: There is mild straightening of the normal cervical lordosis. There is no listhesis. Vertebral body heights are preserved. Cervical vertebral bone marrow signal is within normal limits. Sequelae of prior left suboccipital  craniotomy are partially visualized with left cerebellar edema, enhancement, and changes in the overlying soft tissues more fully evaluated on prior brain MRI. Cervical spinal cord is normal in caliber and signal.  C2-3:  Mild right facet arthrosis without significant stenosis.  C3-4: Mild uncovertebral spurring and mild left facet arthrosis result in mild bilateral neural foraminal stenosis without spinal stenosis.  C4-5: Mild uncovertebral spurring and mild left facet arthrosis result in mild bilateral neural foraminal stenosis without spinal stenosis.  C5-6: Mild disc bulging, uncovertebral spurring, and left facet arthrosis result in mild right and moderate left neural foraminal stenosis without spinal stenosis.  C6-7: Small central disc protrusion results in mild spinal stenosis. Uncovertebral spurring results in severe bilateral neural foraminal stenosis.  C7-T1: Tiny right paracentral disc protrusion without significant stenosis.  IMPRESSION: 1. Mild-to-moderate multilevel cervical disc and facet degeneration, worst at C6-7 where there is mild spinal stenosis and severe bilateral neural foraminal stenosis. 2. No evidence of metastatic disease in the cervical spine.   Electronically Signed   By: ALogan Bores  On: 04/11/2015 20:26   Impression:  The patient has recurrence of left cerebellar metastasis following preoperative stereotactic radiosurgery followed by resection. 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  He may potentially benefit from salvage radiotherapy to this area and the posterior fossa.  Plan:  Today, I recommended the patient consider radiotherapy to the posterior fossa for salvage. We talked with benefits and risks associated with this treatment and he is interested in proceeding. The pt wants his simulation for treatment on July 1. The pt wants a 8AM treatment time. I have given him a rx of Xanax. In this  setting, we will offer Conventional fractionated radiotherapy using the open mask.  This document serves as a record of services personally performed by MTyler Pita MD. It was created on his behalf by JDarcus Austin a trained medical scribe. The creation of this record is based on the scribe's personal observations and the provider's statements to them. This document has been checked and approved by the attending provider.     _____________________________________  MSheral Apley MTammi Klippel M.D.

## 2015-05-01 ENCOUNTER — Ambulatory Visit: Payer: Self-pay | Admitting: Radiation Oncology

## 2015-05-01 ENCOUNTER — Ambulatory Visit: Payer: BLUE CROSS/BLUE SHIELD | Admitting: Radiation Oncology

## 2015-05-01 ENCOUNTER — Ambulatory Visit: Payer: BLUE CROSS/BLUE SHIELD

## 2015-05-02 ENCOUNTER — Ambulatory Visit: Payer: BC Managed Care – PPO | Admitting: Thoracic Surgery (Cardiothoracic Vascular Surgery)

## 2015-05-04 ENCOUNTER — Ambulatory Visit: Payer: Self-pay | Admitting: Radiation Oncology

## 2015-05-11 ENCOUNTER — Ambulatory Visit
Admission: RE | Admit: 2015-05-11 | Discharge: 2015-05-11 | Disposition: A | Discharge status: Home / Self Care | Payer: BLUE CROSS/BLUE SHIELD | Source: Ambulatory Visit | Attending: Radiation Oncology | Admitting: Radiation Oncology

## 2015-05-11 DIAGNOSIS — C3412 Malignant neoplasm of upper lobe, left bronchus or lung: Secondary | ICD-10-CM | POA: Insufficient documentation

## 2015-05-11 DIAGNOSIS — Z87891 Personal history of nicotine dependence: Secondary | ICD-10-CM | POA: Insufficient documentation

## 2015-05-11 DIAGNOSIS — C7931 Secondary malignant neoplasm of brain: Secondary | ICD-10-CM

## 2015-05-11 MED ORDER — OXYCODONE-ACETAMINOPHEN 5-325 MG PO TABS: 1.0000 | ORAL_TABLET | ORAL | Status: DC | PRN

## 2015-05-16 ENCOUNTER — Other Ambulatory Visit: Payer: Self-pay | Admitting: Radiation Oncology

## 2015-05-16 DIAGNOSIS — J449 Chronic obstructive pulmonary disease, unspecified: Secondary | ICD-10-CM

## 2015-05-16 DIAGNOSIS — C7931 Secondary malignant neoplasm of brain: Secondary | ICD-10-CM

## 2015-05-16 DIAGNOSIS — C3412 Malignant neoplasm of upper lobe, left bronchus or lung: Secondary | ICD-10-CM | POA: Diagnosis not present

## 2015-05-16 MED ORDER — DEXAMETHASONE 4 MG PO TABS: 4.0000 mg | ORAL_TABLET | Freq: Two times a day (BID) | ORAL | Status: DC

## 2015-05-19 ENCOUNTER — Ambulatory Visit
Admission: RE | Admit: 2015-05-19 | Discharge: 2015-05-19 | Disposition: A | Discharge status: Home / Self Care | Payer: BLUE CROSS/BLUE SHIELD | Source: Ambulatory Visit | Attending: Radiation Oncology | Admitting: Radiation Oncology

## 2015-05-19 DIAGNOSIS — C3412 Malignant neoplasm of upper lobe, left bronchus or lung: Secondary | ICD-10-CM | POA: Diagnosis not present

## 2015-05-22 ENCOUNTER — Ambulatory Visit
Admission: RE | Admit: 2015-05-22 | Discharge: 2015-05-22 | Disposition: A | Discharge status: Home / Self Care | Payer: BLUE CROSS/BLUE SHIELD | Source: Ambulatory Visit | Attending: Radiation Oncology | Admitting: Radiation Oncology

## 2015-05-22 DIAGNOSIS — C3412 Malignant neoplasm of upper lobe, left bronchus or lung: Secondary | ICD-10-CM | POA: Diagnosis not present

## 2015-05-23 ENCOUNTER — Other Ambulatory Visit: Payer: Self-pay | Admitting: Radiation Oncology

## 2015-05-23 ENCOUNTER — Encounter: Payer: Self-pay | Admitting: Radiation Oncology

## 2015-05-23 ENCOUNTER — Ambulatory Visit
Admission: RE | Admit: 2015-05-23 | Discharge: 2015-05-23 | Disposition: A | Discharge status: Home / Self Care | Payer: BLUE CROSS/BLUE SHIELD | Source: Ambulatory Visit | Attending: Radiation Oncology | Admitting: Radiation Oncology

## 2015-05-23 ENCOUNTER — Telehealth: Payer: Self-pay | Admitting: Radiation Oncology

## 2015-05-23 VITALS — BP 151/89 | HR 67 | Resp 16 | Wt 263.3 lb

## 2015-05-23 DIAGNOSIS — C7931 Secondary malignant neoplasm of brain: Secondary | ICD-10-CM

## 2015-05-23 DIAGNOSIS — C3412 Malignant neoplasm of upper lobe, left bronchus or lung: Secondary | ICD-10-CM | POA: Diagnosis not present

## 2015-05-23 MED ORDER — OXYCODONE-ACETAMINOPHEN 5-325 MG PO TABS: 1.0000 | ORAL_TABLET | ORAL | Status: DC | PRN

## 2015-05-23 NOTE — Telephone Encounter (Signed)
Received message that patient is requesting refill of his Percocet. Phoned patient' wife. She reports the patient is completely out. Explained that Dr. Tammi Klippel is not in the clinic today and her husband would need to be seen by another physician following treatment in order to obtain refill. She verbalized understanding.

## 2015-05-23 NOTE — Telephone Encounter (Signed)
Have sent request for refill pain med to [facility], Dr. Johny Shears nurse

## 2015-05-23 NOTE — Telephone Encounter (Signed)
Refilled and printed.  I am in my office for signature if you want.

## 2015-05-23 NOTE — Progress Notes (Signed)
  Radiation Oncology         [phone] ________________________________  Name: [name] [name] MRN: [id number]  Date: 05/23/2015  DOB: 01-22-1956  Weekly Radiation Therapy Management  DIAGNOSIS: [name] [name] is a 59 year old gentleman presenting to clinic in regards to his secondary malignant neoplasm of the brain.  Current Dose: 5 Gy     Planned Dose:  35 Gy  Narrative . . . . . . . . The patient presents for routine under treatment assessment.                                   The patient requested to be seen today. He continues to have headaches primarily in the left occipital area. These are worse upon awakening in the morning. The patient takes approximately 4-5 Percocet today. He is close to running out of this prescription.                                 Set-up films were reviewed.                                 The chart was checked. Physical Findings. . .  weight is 263 lb 4.8 oz (119.432 kg). His blood pressure is 151/89 and his pulse is 67. His respiration is 16. Marland Kitchen He is alert and cooperative and responds appropriately to questions. Examination of the oral cavity reveals no secondary infection. Impression . . . . . . . The patient is tolerating radiation. Plan . . . . . . . . . . . . Continue treatment as planned. Patient was given a refill on his Percocet today.  ________________________________   Blair Promise, PhD, MD

## 2015-05-23 NOTE — Progress Notes (Addendum)
Patient presents to the clinic following second whole brain radiation treatment. Patient reports he took his last percocet tablet last night. He is requesting a refill. Patient reports the percocet helps relieve his headache and left side neck pain. Patient reports that he is taking decadron 4 mg bid as directed. Patient tearful today. Attempted to console patient. Denies nausea, vomiting or dizziness.   BP 151/89 mmHg  Pulse 67  Resp 16  Wt 263 lb 4.8 oz (119.432 kg) Wt Readings from Last 3 Encounters:  05/23/15 263 lb 4.8 oz (119.432 kg)  04/28/15 265 lb 4.8 oz (120.339 kg)  04/21/15 260 lb (117.935 kg)

## 2015-05-24 ENCOUNTER — Ambulatory Visit
Admission: RE | Admit: 2015-05-24 | Discharge: 2015-05-24 | Disposition: A | Discharge status: Home / Self Care | Payer: BLUE CROSS/BLUE SHIELD | Source: Ambulatory Visit | Attending: Radiation Oncology | Admitting: Radiation Oncology

## 2015-05-24 DIAGNOSIS — C3412 Malignant neoplasm of upper lobe, left bronchus or lung: Secondary | ICD-10-CM | POA: Diagnosis not present

## 2015-05-25 ENCOUNTER — Ambulatory Visit
Admission: RE | Admit: 2015-05-25 | Discharge: 2015-05-25 | Disposition: A | Discharge status: Home / Self Care | Payer: BLUE CROSS/BLUE SHIELD | Source: Ambulatory Visit | Attending: Radiation Oncology | Admitting: Radiation Oncology

## 2015-05-25 ENCOUNTER — Telehealth: Payer: Self-pay | Admitting: Internal Medicine

## 2015-05-25 DIAGNOSIS — C3412 Malignant neoplasm of upper lobe, left bronchus or lung: Secondary | ICD-10-CM | POA: Diagnosis not present

## 2015-05-25 NOTE — Telephone Encounter (Signed)
S.wManuela Schwartz in radonc and r/s lab to b4 last radiation tx...Marland Kitchenok per [facility]

## 2015-05-26 ENCOUNTER — Ambulatory Visit
Admission: RE | Admit: 2015-05-26 | Discharge: 2015-05-26 | Disposition: A | Discharge status: Home / Self Care | Payer: BLUE CROSS/BLUE SHIELD | Source: Ambulatory Visit | Attending: Radiation Oncology | Admitting: Radiation Oncology

## 2015-05-26 ENCOUNTER — Encounter: Payer: Self-pay | Admitting: Radiation Oncology

## 2015-05-26 VITALS — BP 147/98 | HR 72 | Resp 16 | Wt 286.4 lb

## 2015-05-26 DIAGNOSIS — C3412 Malignant neoplasm of upper lobe, left bronchus or lung: Secondary | ICD-10-CM | POA: Diagnosis not present

## 2015-05-26 DIAGNOSIS — C7931 Secondary malignant neoplasm of brain: Secondary | ICD-10-CM

## 2015-05-26 NOTE — Progress Notes (Addendum)
Weight and vitals stable. Denies pain. Reports occasional headaches. Denies nausea or vomiting. Taking decadron 4 mg bid. Denies dizziness. Mild hyperpigmentation of scalp without desquamation noted.   BP 147/98 mmHg  Pulse 72  Resp 16  Wt 286 lb 6.4 oz (129.91 kg) Wt Readings from Last 3 Encounters:  05/26/15 286 lb 6.4 oz (129.91 kg)  05/23/15 263 lb 4.8 oz (119.432 kg)  04/28/15 265 lb 4.8 oz (120.339 kg)   Oriented patient to staff and routine of the clinic. Provided patient with RADIATION THERAPY AND YOU handbook then, reviewed pertinent information. Educated patient reference potential side effects and management such as, fatigue, skin changes, and  Nausea/vomiting. Provided patient with Biafine and directed upon use. Encouraged patient to contact this RN with future needs and provided a business card. Patient verbalized understanding of all reviewed.

## 2015-05-26 NOTE — Progress Notes (Signed)
  Radiation Oncology         [phone]   Name: [name] [name] MRN: [id number]   Date: 05/26/2015  DOB: 07-Mar-1956   Weekly Radiation Therapy Management    ICD-9-CM ICD-10-CM   1. Brain metastases 198.3 C79.31     Current Dose: 12.5 Gy  Planned Dose:  35 Gy  Narrative The patient presents for routine under treatment assessment. Denies pain. Reports occasional headaches. Denies nausea or vomiting. Taking decadron 4 mg bid. Denies dizziness. Mild hyperpigmentation of scalp without desquamation noted.  The patient is without complaint. Headaches improved with dexamethasone. Set-up films were reviewed. The chart was checked.  Physical Findings  weight is 286 lb 6.4 oz (129.91 kg). His blood pressure is 147/98 and his pulse is 72. His respiration is 16. . Weight essentially stable.  No significant changes.  Impression The patient is tolerating radiation.  Plan Continue treatment as planned.         Sheral Apley Tammi Klippel, M.D.

## 2015-05-29 ENCOUNTER — Other Ambulatory Visit: Payer: Self-pay | Admitting: Radiation Oncology

## 2015-05-29 ENCOUNTER — Telehealth: Payer: Self-pay | Admitting: Radiation Oncology

## 2015-05-29 ENCOUNTER — Ambulatory Visit
Admission: RE | Admit: 2015-05-29 | Discharge: 2015-05-29 | Disposition: A | Discharge status: Home / Self Care | Payer: BLUE CROSS/BLUE SHIELD | Source: Ambulatory Visit | Attending: Radiation Oncology | Admitting: Radiation Oncology

## 2015-05-29 DIAGNOSIS — C3412 Malignant neoplasm of upper lobe, left bronchus or lung: Secondary | ICD-10-CM | POA: Diagnosis not present

## 2015-05-29 DIAGNOSIS — C7931 Secondary malignant neoplasm of brain: Secondary | ICD-10-CM

## 2015-05-29 NOTE — Telephone Encounter (Signed)
Received epic in basket message that patient is requesting a refill of his Percocet. Script secured in nursing from where Dr. Tammi Klippel refilled it on 05/23/2015. Phoned patient's home. No answer. Phoned patient's wife's cell. No answer but, left message script is ready for pick up. Spoke with Joellen Jersey, RT on L1 and requested she send patient around to nursing following treatment to pick up Percocet script. She verbalized understanding.

## 2015-05-30 ENCOUNTER — Ambulatory Visit
Admission: RE | Admit: 2015-05-30 | Discharge: 2015-05-30 | Disposition: A | Discharge status: Home / Self Care | Payer: BLUE CROSS/BLUE SHIELD | Source: Ambulatory Visit | Attending: Radiation Oncology | Admitting: Radiation Oncology

## 2015-05-30 DIAGNOSIS — C3412 Malignant neoplasm of upper lobe, left bronchus or lung: Secondary | ICD-10-CM | POA: Diagnosis not present

## 2015-05-30 MED ORDER — BIAFINE EX EMUL
Freq: Every day | CUTANEOUS | Status: AC
  Administered 2015-05-30: 10:00:00 via TOPICAL

## 2015-05-30 NOTE — Addendum Note (Signed)
Encounter addended by: Heywood Footman, RN on: 05/30/2015 10:06 AM<BR>     Documentation filed: Chief Complaint Section, Visit Diagnoses, Dx Association, Notes Section, Inpatient MAR, Orders

## 2015-05-31 ENCOUNTER — Ambulatory Visit
Admission: RE | Admit: 2015-05-31 | Discharge: 2015-05-31 | Disposition: A | Discharge status: Home / Self Care | Payer: BLUE CROSS/BLUE SHIELD | Source: Ambulatory Visit | Attending: Radiation Oncology | Admitting: Radiation Oncology

## 2015-05-31 DIAGNOSIS — C3412 Malignant neoplasm of upper lobe, left bronchus or lung: Secondary | ICD-10-CM | POA: Diagnosis not present

## 2015-06-01 ENCOUNTER — Ambulatory Visit
Admission: RE | Admit: 2015-06-01 | Discharge: 2015-06-01 | Disposition: A | Discharge status: Home / Self Care | Payer: BLUE CROSS/BLUE SHIELD | Source: Ambulatory Visit | Attending: Radiation Oncology | Admitting: Radiation Oncology

## 2015-06-01 DIAGNOSIS — C3412 Malignant neoplasm of upper lobe, left bronchus or lung: Secondary | ICD-10-CM | POA: Diagnosis not present

## 2015-06-02 ENCOUNTER — Ambulatory Visit
Admission: RE | Admit: 2015-06-02 | Discharge: 2015-06-02 | Disposition: A | Discharge status: Home / Self Care | Payer: BLUE CROSS/BLUE SHIELD | Source: Ambulatory Visit | Attending: Radiation Oncology | Admitting: Radiation Oncology

## 2015-06-02 ENCOUNTER — Encounter: Payer: Self-pay | Admitting: Radiation Oncology

## 2015-06-02 ENCOUNTER — Ambulatory Visit: Payer: BLUE CROSS/BLUE SHIELD

## 2015-06-02 ENCOUNTER — Ambulatory Visit
Admission: RE | Admit: 2015-06-02 | Discharge: 2015-06-02 | Disposition: A | Discharge status: Home / Self Care | Payer: BLUE CROSS/BLUE SHIELD | Source: Ambulatory Visit | Attending: Family Medicine | Admitting: Family Medicine

## 2015-06-02 VITALS — BP 144/84 | HR 75 | Temp 98.4°F | Resp 20 | Wt 266.9 lb

## 2015-06-02 DIAGNOSIS — C7931 Secondary malignant neoplasm of brain: Secondary | ICD-10-CM

## 2015-06-02 DIAGNOSIS — C3412 Malignant neoplasm of upper lobe, left bronchus or lung: Secondary | ICD-10-CM | POA: Diagnosis not present

## 2015-06-02 NOTE — Consult Note (Signed)
Patient [name] [name]      DOB: May 18, 1956      ZLD:[id number]     Consult Note from the Palliative Medicine Team at [place] Requested by:     PCP: [name] Sake, MD Reason for Consultation              Phone                                                                                                                                [phone]  Assessment of patients Current state:     Consult is for introduction to the concept of Palliative Medicine,  Clarification of Advanced Directives,  holistic support and symptom management as indicated.  Concept of Hospice and Palliative Care were discussed  This NP [name] [name] reviewed medical records, received report from team, assessed the patient and then meet with [name] [name] and his wife [name] [name] in the outpatient oncology clinic.  Created space and opportunity for [name] [name] and his wife to share their thoughts and  feelings living with a serious life limiting illness  A discussion was had today regarding advanced directives.  Concepts specific to code status, artifical feeding and hydration, continued IV antibiotics and rehospitalization was had.  The difference between a aggressive medical intervention path  and a palliative comfort care path for this patient at this time was had.  Values and goals of care important to patient and family were attempted to be elicited.   Questions and concerns addressed. Family encouraged to call with questions or concerns.  PMT will continue to support holistically at patient/family request    Goals of Care: 1.  Code Status:   Full code-strongly encouraged to consider DNR status understanding the limitation of CPR in this patient  2. Scope of Treatment:  At this time patient  is open to all available and offered medicial interventions to prolong quality life.   3. Symptom Management:    1. Pain: Percocet 5-325 mg one tablet every 4 hrs prn-we discussed use of of long acting  agents as strategy for better pain control, at this time is wishes to continue with his current regime.  He is utilizing 6-8 tablets a day.  2. Bowel Regimen:  Senna-s 1-2 tablets qhs prn- discussed opiod induced constipation   4. Psychosocial:  Emotional support offered to patient.  Both patient and his wife verbalize great emotional and financial difficulties at this time.  [name] [name] is trying to work "[name]" while also being there for her husband.  [name] [name] is quite frustrated with the physical limitations of the disease. Encouraged to keep the lines of communication open. They also have a 45 yo daughter being worked uo for "some type of cancer", unsure exactly what is going on with her.    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  2) metastatic non-small cell lung cancer, adenocarcinoma initially diagnosed as stage IIB (T3, N1, M0) in June of 2014.   PRIOR THERAPY:  1) Status post 3 cycles of systemic chemotherapy with carboplatin and etoposide. Last dose was given on 07/16/2011.  2) Left video-assisted thoracoscopy, mini thoracotomy; wedge, posterior segment of left upper lobe; wedge, anterior segment of left lower lobe with node dissection.  3) Redo left video-assisted thoracoscopy, lysis of adhesions, wedge resection of left lower lobe, thoracoscopic left lower lobectomy under the care of Dr. Roxan Hockey on 05/03/2013.  4) Adjuvant chemotherapy with cisplatin 75 mg/M2 and Alimta 500 mg/M2 every 3 weeks, first dose given 06/28/2013. Status post 4 cycles. 5) status post stereotactic radiotherapy as well as craniotomy with tumor excision under the care of Dr. Tammi Klippel and Dr. Vertell Limber on 11/28/2014 and the final pathology was  consistent with metastatic adenocarcinoma of lung primary.  CURRENT THERAPY: Observation.  ROS:    -fatigue and irritability    PMH:  Past Medical History  Diagnosis Date  . Hypercholesteremia     takes Pravastatin nightly  . HTN (hypertension)     takes Lisinopril and HCTZ daily  . Shortness of breath     with exertion;Albuterol inhaler prn  . Insomnia     takes Xanax prn  . Lung cancer dx'd 2012  . Brain cancer   . Anxiety   . Arthritis   . COPD (chronic obstructive pulmonary disease)      PSH: Past Surgical History  Procedure Laterality Date  . Left video-assisted thoracoscopy, mini thoracotomy; wedge, posterior segment of left upper lobe; wedge, anterior segment of left lower lobe with node dissection.  09/02/11    [facility]  . Video assisted thoracoscopy Left 05/03/2013    Procedure: REDO VIDEO ASSISTED THORACOSCOPY;  Surgeon: Melrose Nakayama, MD;  Location: [facility];  Service: Thoracic;  Laterality: Left;  REDO LEFT VATS  . Thoracotomy Left 05/03/2013    Procedure: THORACOTOMY MAJOR;  Surgeon: Melrose Nakayama, MD;  Location: [facility];  Service: Thoracic;  Laterality: Left;  . Lobectomy Left 05/03/2013    Procedure: LOBECTOMY;  Surgeon: Melrose Nakayama, MD;  Location: [facility];  Service: Thoracic;  Laterality: Left;  . Wedge resection Left 05/03/2013    Procedure: WEDGE RESECTION;  Surgeon: Melrose Nakayama, MD;  Location: [facility];  Service: Thoracic;  Laterality: Left;  . Open reduction internal fixation (orif) tibia/fibula fracture Right 07/01/2013    Procedure: OPEN REDUCTION INTERNAL FIXATION TIBIAL  FRACTURE;  Surgeon: Johnn Hai, MD;  Location: [facility];  Service: Orthopedics;  Laterality: Right;  . Craniotomy N/A 11/28/2014    Procedure: CRANIOTOMY TUMOR EXCISION;  Surgeon: Erline Levine, MD;  Location: [facility];  Service: Neurosurgery;  Laterality: N/A;   I have reviewed the [place] and SH and  If appropriate update it with new information. No Known  Allergies Scheduled Meds: Continuous Infusions: PRN Meds:.  There were no vitals taken for this visit.    No intake or output data in the 24 hours ending 06/02/15 1340  Labs: CBC    Component Value Date/Time   WBC 10.0 03/06/2015 1008   WBC 12.4* 11/23/2014 1003   RBC 5.12 03/06/2015 1008   RBC 5.10 11/23/2014 1003  HGB 13.8 03/06/2015 1008   HGB 13.6 11/23/2014 1003   HCT 42.4 03/06/2015 1008   HCT 40.7 11/23/2014 1003   PLT 270 03/06/2015 1008   PLT 210 11/23/2014 1003   MCV 82.8 03/06/2015 1008   MCV 79.8 11/23/2014 1003   MCH 27.0* 03/06/2015 1008   MCH 26.7 11/23/2014 1003   MCHC 32.5 03/06/2015 1008   MCHC 33.4 11/23/2014 1003   RDW 14.1 03/06/2015 1008   RDW 14.5 11/23/2014 1003   LYMPHSABS 3.5* 03/06/2015 1008   LYMPHSABS 2.7 07/01/2013 1610   MONOABS 0.9 03/06/2015 1008   MONOABS 0.4 07/01/2013 1610   EOSABS 0.3 03/06/2015 1008   EOSABS 0.0 07/01/2013 1610   BASOSABS 0.0 03/06/2015 1008   BASOSABS 0.0 07/01/2013 1610    BMET    Component Value Date/Time   NA 140 03/06/2015 1008   NA 131* 11/23/2014 1003   NA 138 03/25/2012 1225   K 5.2* 03/06/2015 1008   K 4.2 11/23/2014 1003   K 4.2 03/25/2012 1225   CL 97 11/23/2014 1003   CL 105 03/03/2013 1002   CL 98 03/25/2012 1225   CO2 22 03/06/2015 1008   CO2 25 11/23/2014 1003   CO2 26 03/25/2012 1225   GLUCOSE 95 03/06/2015 1008   GLUCOSE 113* 11/23/2014 1003   GLUCOSE 107* 03/03/2013 1002   GLUCOSE 99 03/25/2012 1225   BUN 18.5 04/21/2015 0811   BUN 38* 11/23/2014 1003   BUN 11 03/25/2012 1225   CREATININE 1.4* 04/21/2015 0811   CREATININE 1.67* 11/23/2014 1003   CREATININE 1.0 03/25/2012 1225   CALCIUM 9.4 03/06/2015 1008   CALCIUM 8.6 11/23/2014 1003   CALCIUM 8.6 03/25/2012 1225   GFRNONAA 44* 11/23/2014 1003   GFRAA 51* 11/23/2014 1003    CMP     Component Value Date/Time   NA 140 03/06/2015 1008   NA 131* 11/23/2014 1003   NA 138 03/25/2012 1225   K 5.2* 03/06/2015 1008   K  4.2 11/23/2014 1003   K 4.2 03/25/2012 1225   CL 97 11/23/2014 1003   CL 105 03/03/2013 1002   CL 98 03/25/2012 1225   CO2 22 03/06/2015 1008   CO2 25 11/23/2014 1003   CO2 26 03/25/2012 1225   GLUCOSE 95 03/06/2015 1008   GLUCOSE 113* 11/23/2014 1003   GLUCOSE 107* 03/03/2013 1002   GLUCOSE 99 03/25/2012 1225   BUN 18.5 04/21/2015 0811   BUN 38* 11/23/2014 1003   BUN 11 03/25/2012 1225   CREATININE 1.4* 04/21/2015 0811   CREATININE 1.67* 11/23/2014 1003   CREATININE 1.0 03/25/2012 1225   CALCIUM 9.4 03/06/2015 1008   CALCIUM 8.6 11/23/2014 1003   CALCIUM 8.6 03/25/2012 1225   PROT 6.8 03/06/2015 1008   PROT 5.8* 07/02/2013 0455   PROT 7.4 03/25/2012 1225   ALBUMIN 3.7 03/06/2015 1008   ALBUMIN 2.9* 07/02/2013 0455   AST 19 03/06/2015 1008   AST 23 07/02/2013 0455   AST 33 03/25/2012 1225   ALT 24 03/06/2015 1008   ALT 34 07/02/2013 0455   ALT 43 03/25/2012 1225   ALKPHOS 93 03/06/2015 1008   ALKPHOS 51 07/02/2013 0455   ALKPHOS 84 03/25/2012 1225   BILITOT 0.37 03/06/2015 1008   BILITOT 0.5 07/02/2013 0455   BILITOT 0.80 03/25/2012 1225   GFRNONAA 44* 11/23/2014 1003   GFRAA 51* 11/23/2014 1003   ECOG PERFORMANCE STATUS* (Eastern Cooperative Oncology Group)  0 Fully active, able to continue with all pre-disease activities without restriction.  Pt score  1 Restricted in physically strenuous activity but ambulatory and able to carry out work of a light or sedentary nature, e.g., light house work, office work. 1  2 Ambulatory and capable of all self-care but unable to carry out any work activities. Up and about more than 50% of waking hours.    3 Capable of only limited self-care. Confined to bed or chair more than 50% of waking hours.   4 Completely disabled. Cannot carry on any self-care. Totally confined to bed or chair.   5 Dead.    As published in Am. J. Clin. Oncol.: Eustace Pen, M.M., Colon Flattery., [place], D.C., [place]orton, Sharen Hint., Drexel Iha, P.P.:  Toxicity And Response Criteria Of [facility] Group. [place] 5:631-497, 1982.  The ECOG Performance Status is in the public domain therefore available for public use. To duplicate the scale, please cite the reference above and credit the [facility], Tyler Pita M.D., Group Chair    Time In Time Out Total Time Spent with Patient Total Overall Time  1200 1330 90 min 90 min    Greater than 50%  of this time was spent counseling and coordinating care related to the above assessment and plan.   [name] Lessen NP  Palliative Medicine Team Team Phone # [phone] Pager [phone]  Discussed with Dr Tammi Klippel

## 2015-06-02 NOTE — Progress Notes (Addendum)
Weekly rad txs 1014 completed whole brain, red and rash like behind left ear,  Forehead, using biafine 2-3x day, taking decadron '4mg'$  bid, heada cahe this am took percocet,resolved, soreness back head where incision was, appetite good, no thrush seen, says back mouth soreness nothing seen, energy level  Comes and goes, ,drinks water,gator ade and power ade  Not otherwise examined today. There were no vitals taken for this visit.  Wt Readings from Last 3 Encounters:  05/26/15 286 lb 6.4 oz (129.91 kg)  05/23/15 263 lb 4.8 oz (119.432 kg)  04/28/15 265 lb 4.8 oz (120.339 kg)  BP 144/84 mmHg  Pulse 75  Temp(Src) 98.4 F (36.9 C) (Oral)  Resp 20  Wt 266 lb 14.4 oz (121.065 kg)  SpO2 100% 12:08 PM

## 2015-06-02 NOTE — Progress Notes (Signed)
  Radiation Oncology         [phone]   Name: [name] [name] MRN: [id number]   Date: 06/02/2015  DOB: 1956/03/06     Weekly Radiation Therapy Management    ICD-9-CM ICD-10-CM   1. Brain metastases 198.3 C79.31     Current Dose: 25 Gy  Planned Dose:  35 Gy  Narrative The patient presents for routine under treatment assessment. Weekly rad txs 10/14 completed whole brain, red and rash like behind left ear, Forehead, using biafine 2-3x day, taking decadron '4mg'$  bid, headache this am took percocet, resolved, soreness back head where incision was, appetite good, no thrush seen, says back mouth soreness nothing seen, energy level comes and goes, drinks water, gatorade and powerade.  Not otherwise examined today.  There were no vitals taken for this visit. The patient is without complaint. Set-up films were reviewed. The chart was checked.  Physical Findings  weight is 266 lb 14.4 oz (121.065 kg). His oral temperature is 98.4 F (36.9 C). His blood pressure is 144/84 and his pulse is 75. His respiration is 20 and oxygen saturation is 100%. . Weight essentially stable.  No significant changes.  Impression The patient is tolerating radiation.  Plan Continue treatment as planned.   This document serves as a record of services personally performed by Tyler Pita, MD. It was created on his behalf by Arlyce Harman, a trained medical scribe. The creation of this record is based on the scribe's personal observations and the provider's statements to them. This document has been checked and approved by the attending provider.      Sheral Apley Tammi Klippel, M.D.

## 2015-06-05 ENCOUNTER — Encounter: Payer: Self-pay | Admitting: *Deleted

## 2015-06-05 ENCOUNTER — Ambulatory Visit
Admission: RE | Admit: 2015-06-05 | Discharge: 2015-06-05 | Disposition: A | Discharge status: Home / Self Care | Payer: BLUE CROSS/BLUE SHIELD | Source: Ambulatory Visit | Attending: Radiation Oncology | Admitting: Radiation Oncology

## 2015-06-05 ENCOUNTER — Telehealth: Payer: Self-pay | Admitting: Internal Medicine

## 2015-06-05 ENCOUNTER — Other Ambulatory Visit: Payer: Self-pay | Admitting: Radiation Oncology

## 2015-06-05 DIAGNOSIS — C3412 Malignant neoplasm of upper lobe, left bronchus or lung: Secondary | ICD-10-CM | POA: Diagnosis not present

## 2015-06-05 DIAGNOSIS — C7931 Secondary malignant neoplasm of brain: Secondary | ICD-10-CM

## 2015-06-05 NOTE — Progress Notes (Signed)
Oncology Nurse Navigator Documentation  Oncology Nurse Navigator Flowsheets 06/05/2015  Navigator Encounter Type Other/I received a call from Palliative NP, Stanton Kidney.  Patient needs CT scan to be re-scheduled for this week.  I clarified with Dr. Julien Nordmann.  He stated that would be ok to schedule CT scan this week. I notified scheduling to re-schedule and to cal wife with new appt time.    Treatment Phase Treatment  Barriers/Navigation Needs Transportation  Interventions Coordination of Care  Time Spent with Patient 15

## 2015-06-05 NOTE — Telephone Encounter (Signed)
Left message to confirm CT scan appointment 07/28

## 2015-06-06 ENCOUNTER — Ambulatory Visit
Admission: RE | Admit: 2015-06-06 | Discharge: 2015-06-06 | Disposition: A | Discharge status: Home / Self Care | Payer: BLUE CROSS/BLUE SHIELD | Source: Ambulatory Visit | Attending: Radiation Oncology | Admitting: Radiation Oncology

## 2015-06-06 ENCOUNTER — Other Ambulatory Visit: Payer: Self-pay | Admitting: Radiation Oncology

## 2015-06-06 DIAGNOSIS — G8928 Other chronic postprocedural pain: Secondary | ICD-10-CM

## 2015-06-06 DIAGNOSIS — C7931 Secondary malignant neoplasm of brain: Secondary | ICD-10-CM

## 2015-06-06 DIAGNOSIS — C3412 Malignant neoplasm of upper lobe, left bronchus or lung: Secondary | ICD-10-CM | POA: Diagnosis not present

## 2015-06-06 DIAGNOSIS — Z9889 Other specified postprocedural states: Secondary | ICD-10-CM

## 2015-06-06 MED ORDER — OXYCODONE-ACETAMINOPHEN 5-325 MG PO TABS: 1.0000 | ORAL_TABLET | ORAL | Status: DC | PRN

## 2015-06-07 ENCOUNTER — Telehealth: Payer: Self-pay

## 2015-06-07 ENCOUNTER — Ambulatory Visit
Admission: RE | Admit: 2015-06-07 | Discharge: 2015-06-07 | Disposition: A | Discharge status: Home / Self Care | Payer: BLUE CROSS/BLUE SHIELD | Source: Ambulatory Visit | Attending: Radiation Oncology | Admitting: Radiation Oncology

## 2015-06-07 DIAGNOSIS — C3412 Malignant neoplasm of upper lobe, left bronchus or lung: Secondary | ICD-10-CM | POA: Diagnosis not present

## 2015-06-07 NOTE — Telephone Encounter (Signed)
Patient notified pain prescription ready for pick-up in nursing.He or wife will pick up after radiation treatment today at 4:00 pm.

## 2015-06-08 ENCOUNTER — Ambulatory Visit (HOSPITAL_COMMUNITY)
Admission: RE | Admit: 2015-06-08 | Discharge: 2015-06-08 | Disposition: A | Discharge status: Home / Self Care | Payer: BLUE CROSS/BLUE SHIELD | Source: Ambulatory Visit | Attending: Radiation Oncology | Admitting: Radiation Oncology

## 2015-06-08 ENCOUNTER — Other Ambulatory Visit (HOSPITAL_BASED_OUTPATIENT_CLINIC_OR_DEPARTMENT_OTHER): Payer: BLUE CROSS/BLUE SHIELD

## 2015-06-08 ENCOUNTER — Ambulatory Visit
Admission: RE | Admit: 2015-06-08 | Discharge: 2015-06-08 | Disposition: A | Discharge status: Home / Self Care | Payer: BLUE CROSS/BLUE SHIELD | Source: Ambulatory Visit | Attending: Radiation Oncology | Admitting: Radiation Oncology

## 2015-06-08 ENCOUNTER — Ambulatory Visit (HOSPITAL_COMMUNITY)
Admission: RE | Admit: 2015-06-08 | Discharge: 2015-06-08 | Disposition: A | Discharge status: Home / Self Care | Payer: BLUE CROSS/BLUE SHIELD | Source: Ambulatory Visit | Attending: Internal Medicine | Admitting: Internal Medicine

## 2015-06-08 ENCOUNTER — Encounter: Payer: Self-pay | Admitting: Radiation Oncology

## 2015-06-08 VITALS — BP 150/93 | HR 89 | Temp 98.4°F | Wt 270.0 lb

## 2015-06-08 DIAGNOSIS — C7931 Secondary malignant neoplasm of brain: Secondary | ICD-10-CM

## 2015-06-08 DIAGNOSIS — C3432 Malignant neoplasm of lower lobe, left bronchus or lung: Secondary | ICD-10-CM | POA: Diagnosis not present

## 2015-06-08 DIAGNOSIS — G8928 Other chronic postprocedural pain: Secondary | ICD-10-CM | POA: Insufficient documentation

## 2015-06-08 DIAGNOSIS — C3412 Malignant neoplasm of upper lobe, left bronchus or lung: Secondary | ICD-10-CM | POA: Diagnosis not present

## 2015-06-08 DIAGNOSIS — Z9889 Other specified postprocedural states: Secondary | ICD-10-CM

## 2015-06-08 DIAGNOSIS — C3492 Malignant neoplasm of unspecified part of left bronchus or lung: Secondary | ICD-10-CM

## 2015-06-08 LAB — TECHNOLOGIST REVIEW

## 2015-06-08 LAB — COMPREHENSIVE METABOLIC PANEL (CC13)
ALBUMIN: 3.5 g/dL (ref 3.5–5.0)
ALT: 61 U/L — ABNORMAL HIGH (ref 0–55)
ANION GAP: 5 meq/L (ref 3–11)
AST: 22 U/L (ref 5–34)
Alkaline Phosphatase: 82 U/L (ref 40–150)
BILIRUBIN TOTAL: 0.31 mg/dL (ref 0.20–1.20)
BUN: 33.6 mg/dL — ABNORMAL HIGH (ref 7.0–26.0)
CHLORIDE: 104 meq/L (ref 98–109)
CO2: 28 mEq/L (ref 22–29)
Calcium: 8.8 mg/dL (ref 8.4–10.4)
Creatinine: 1.5 mg/dL — ABNORMAL HIGH (ref 0.7–1.3)
EGFR: 50 mL/min/{1.73_m2} — AB (ref >90)
GLUCOSE: 124 mg/dL (ref 70–140)
POTASSIUM: 4.7 meq/L (ref 3.5–5.1)
Sodium: 137 mEq/L (ref 136–145)
Total Protein: 6.3 g/dL — ABNORMAL LOW (ref 6.4–8.3)

## 2015-06-08 LAB — CBC WITH DIFFERENTIAL/PLATELET
BASO%: 0.1 % (ref 0.0–2.0)
BASOS ABS: 0 10*3/uL (ref 0.0–0.1)
EOS%: 0 % (ref 0.0–7.0)
Eosinophils Absolute: 0 10*3/uL (ref 0.0–0.5)
HCT: 40.9 % (ref 38.4–49.9)
HGB: 13.6 g/dL (ref 13.0–17.1)
LYMPH%: 6.6 % — AB (ref 14.0–49.0)
MCH: 27 pg — ABNORMAL LOW (ref 27.2–33.4)
MCHC: 33.3 g/dL (ref 32.0–36.0)
MCV: 81.2 fL (ref 79.3–98.0)
MONO#: 0.9 10*3/uL (ref 0.1–0.9)
MONO%: 6.6 % (ref 0.0–14.0)
NEUT#: 12.1 10*3/uL — ABNORMAL HIGH (ref 1.5–6.5)
NEUT%: 86.7 % — ABNORMAL HIGH (ref 39.0–75.0)
NRBC: 0 % (ref 0–0)
PLATELETS: 206 10*3/uL (ref 140–400)
RBC: 5.04 10*6/uL (ref 4.20–5.82)
RDW: 16.9 % — AB (ref 11.0–14.6)
WBC: 14 10*3/uL — AB (ref 4.0–10.3)
lymph#: 0.9 10*3/uL (ref 0.9–3.3)

## 2015-06-08 MED ORDER — OXYCODONE HCL 5 MG PO TABS: 5.0000 mg | ORAL_TABLET | ORAL | Status: DC | PRN

## 2015-06-08 MED ORDER — IOHEXOL 300 MG/ML  SOLN
100.0000 mL | Freq: Once | INTRAMUSCULAR | Status: AC | PRN
  Administered 2015-06-08: 100 mL via INTRAVENOUS

## 2015-06-08 NOTE — Progress Notes (Signed)
Patient has completed 14 of 14 treatments of radiation to whole brain.Has continued headache "5".Takes dexamethasone 4 mg bid.Has some hair loss of posterior scalp, pink with itching rash.Given tube of biafine.Scheduled for ct of chest at 5:00 pm today.

## 2015-06-08 NOTE — Progress Notes (Signed)
  Radiation Oncology         [phone]   Name: [name] [name] MRN: [id number]   Date: 06/08/2015  DOB: 06-Dec-1955     Weekly Radiation Therapy Management  Diagnosis: [name] [name] is a 59 year old gentleman presenting to clinic in regards to his secondary malignant neoplasm of the brain.  Current Dose: 35 Gy  Planned Dose:  35 Gy  Narrative The patient presents for routine under treatment assessment and has completed 14 of 14 treatments of radiation to whole brain. He reports a continued headache "5".The patient is still self administering the provided medication Dexamethasone at 4 mg twice a day. He reports some hair loss of posterior scalp with a pink with itching rash observed. He was provided with a tube of Biafine. "Sometimes it tries to hurt a bit around my temple," he stated while also pointing to the back of his left head, where the pain usually occurs. "The tiredness comes off and on." He expressed feeling severe pain, that does not cause him to wake in the night. However, he wake every "couple of hours" at night to eat or use the rest room. For pain management, he has been self administering up to twelve pills a day of Tylenol. The serious side effects of his current pain management habit were addressed immediately. Set-up films were reviewed and the chart was checked.  Physical Findings  weight is 270 lb (122.471 kg). His temperature is 98.4 F (36.9 C). His blood pressure is 150/93 and his pulse is 89. His oxygen saturation is 97%.  His weight is currently essentially stable with no significant changes in the status of his overall health, to be noted at this time.  Impression [name] [name] is a 59 year old gentleman presenting to clinic in regards to his secondary malignant neoplasm of the brain. The patient is tolerating radiation.  Plan All questions vocalized by the patient were fully addressed. Common symptoms to expect at this time in the patient's recovery were discussed and reviewed.  Healthy methods to manage these symptoms if they are to occur were addressed. The patient has been advised to continue treatment as planned. The patient and his partner are advised of the CT scan of the chest at 5:00pm today (0/28/2016) and of his follow up appointment with radiation oncology to take place in one month. The patient was instructed to stop taking twelve pills of pain killers a day as this could cause serious liver damage. He was also instructed on how to slowly ease off of his Dexamethasone medication. The patient is being sent home with prescribed Oxycodone, so that he will not have to continue administering Tylenol. Instructions of proper and healthy use were provided. The patient was informed that if he is to run out of steroids, to contact the clinic and we will assist him. If he develops any further questions or concerns in regards to his treatment and recovery, he has been encouraged to contact me   This document serves as a record of services personally performed by [name] Pita, MD. It was created on his behalf by Lenn Cal, a trained medical scribe. The creation of this record is based on the scribe's personal observations and the provider's statements to them. This document has been checked and approved by the attending provider.  __________________________________________      Sheral Apley. Tammi Klippel, M.D.

## 2015-06-09 MED ORDER — BIAFINE EX EMUL
CUTANEOUS | Status: AC | PRN
  Administered 2015-06-08: 13:00:00 via TOPICAL

## 2015-06-09 NOTE — Addendum Note (Signed)
Encounter addended by: Norm Salt, RN on: 06/09/2015  1:16 PM<BR>     Documentation filed: Inpatient MAR

## 2015-06-09 NOTE — Addendum Note (Signed)
Encounter addended by: Norm Salt, RN on: 06/09/2015  1:05 PM<BR>     Documentation filed: [employer], Orders

## 2015-06-12 ENCOUNTER — Other Ambulatory Visit: Payer: BLUE CROSS/BLUE SHIELD

## 2015-06-12 NOTE — Progress Notes (Signed)
  Radiation Oncology         [phone] ________________________________  Name: MCDANIEL OHMS MRN: [id number]  Date: 06/08/2015  DOB: [birth year]  End of Treatment Note  Diagnosis:   59 yo man with recurrent left cerebellar brain metastasis     Indication for treatment:  Palliation       Radiation treatment dates:   05/22/2015-06/08/2015  Site/dose:   The posterior fossa brain was treated to 35 Gy in 14 fractions of 2.5 Gy  Beams/energy:   Right and Left radiation fields were treated using 6 MV X-rays with custom MLC collimation to shield the eyes and face.  The patient was immobilized with a thermoplastic mask and isocenter was verified with weekly port films.  Narrative: The patient tolerated radiation treatment relatively well.   He had fatigue and headaches during radiation.  Plan: The patient has completed radiation treatment. The patient will return to radiation oncology clinic for routine followup in one month. I advised them to call or return sooner if they have any questions or concerns related to their recovery or treatment. ________________________________  Sheral Apley. Tammi Klippel, M.D.

## 2015-06-19 ENCOUNTER — Ambulatory Visit (HOSPITAL_BASED_OUTPATIENT_CLINIC_OR_DEPARTMENT_OTHER): Payer: BLUE CROSS/BLUE SHIELD | Admitting: Internal Medicine

## 2015-06-19 ENCOUNTER — Telehealth: Payer: Self-pay | Admitting: Internal Medicine

## 2015-06-19 ENCOUNTER — Ambulatory Visit
Admission: RE | Admit: 2015-06-19 | Discharge: 2015-06-19 | Disposition: A | Discharge status: Home / Self Care | Payer: BLUE CROSS/BLUE SHIELD | Source: Ambulatory Visit | Attending: Radiation Oncology | Admitting: Radiation Oncology

## 2015-06-19 ENCOUNTER — Encounter: Payer: Self-pay | Admitting: Internal Medicine

## 2015-06-19 VITALS — BP 139/75 | HR 117 | Temp 98.4°F | Resp 18 | Wt 273.9 lb

## 2015-06-19 VITALS — BP 139/75 | HR 117 | Temp 98.4°F | Resp 18 | Ht 73.0 in | Wt 273.6 lb

## 2015-06-19 DIAGNOSIS — C3432 Malignant neoplasm of lower lobe, left bronchus or lung: Secondary | ICD-10-CM | POA: Diagnosis not present

## 2015-06-19 DIAGNOSIS — G8928 Other chronic postprocedural pain: Secondary | ICD-10-CM

## 2015-06-19 DIAGNOSIS — C3412 Malignant neoplasm of upper lobe, left bronchus or lung: Secondary | ICD-10-CM

## 2015-06-19 DIAGNOSIS — C7931 Secondary malignant neoplasm of brain: Secondary | ICD-10-CM

## 2015-06-19 DIAGNOSIS — Z9889 Other specified postprocedural states: Secondary | ICD-10-CM

## 2015-06-19 MED ORDER — OXYCODONE HCL ER 20 MG PO T12A: 20.0000 mg | EXTENDED_RELEASE_TABLET | Freq: Two times a day (BID) | ORAL | Status: DC

## 2015-06-19 MED ORDER — OXYCODONE HCL 5 MG PO TABS: 5.0000 mg | ORAL_TABLET | ORAL | Status: DC | PRN

## 2015-06-19 NOTE — Progress Notes (Signed)
[place] [phone]   [phone]  OFFICE PROGRESS NOTE  [name] Sake, MD Dr. 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  2) metastatic non-small cell lung cancer, adenocarcinoma initially diagnosed as stage IIB (T3, N1, M0) in June of 2014.   PRIOR THERAPY:  1) Status post 3 cycles of systemic chemotherapy with carboplatin and etoposide. Last dose was given on 07/16/2011.  2) Left video-assisted thoracoscopy, mini thoracotomy; wedge, posterior segment of left upper lobe; wedge, anterior segment of left lower lobe with node dissection.  3) Redo left video-assisted thoracoscopy, lysis of adhesions, wedge resection of left lower lobe, thoracoscopic left lower lobectomy under the care of Dr. Roxan [name] on 05/03/2013.  4) Adjuvant chemotherapy with cisplatin 75 mg/M2 and Alimta 500 mg/M2 every 3 weeks, first dose given 06/28/2013. Status post 4 cycles. 5) status post stereotactic radiotherapy as well as craniotomy with tumor excision under the care of Dr. Tammi [name] and Dr. Vertell [name] on 11/28/2014 and the final pathology was consistent with metastatic adenocarcinoma of lung primary. 6) whole brain irradiation under the care of Dr. Tammi [name] completed 06/08/2015 for recurrent brain metastases.  CURRENT THERAPY: Observation.  CHEMOTHERAPY INTENT: Adjuvant/Curative.  CURRENT # OF CHEMOTHERAPY CYCLES: 0 CURRENT ANTIEMETICS: N/A  CURRENT SMOKING STATUS: Quit smoking 05/02/13  ORAL CHEMOTHERAPY AND CONSENT: None  CURRENT BISPHOSPHONATES USE: None  PAIN MANAGEMENT: 2/10  NARCOTICS INDUCED CONSTIPATION: None  LIVING WILL AND CODE STATUS: Full Code  INTERVAL HISTORY: [name] [name] 59 y.o. male returns to the clinic today for 3 months followup visit accompanied by his wife. The patient is feeling fine today  with no specific complaints except for pain on the left chest wall. He recently completed a course of whole brain irradiation under the care of Dr. Tammi [name]. He is still on a tapering dose of prednisone and gained a lot of weight. The patient denied having any fever or chills. He denied having any shortness of breath, cough or hemoptysis. He has no weight loss or night sweats. He has repeat CT scan of the chest performed recently and he is here for evaluation and discussion of his scan results.  MEDICAL HISTORY: Past Medical History  Diagnosis Date  . Hypercholesteremia     takes Pravastatin nightly  . HTN (hypertension)     takes Lisinopril and HCTZ daily  . Shortness of breath     with exertion;Albuterol inhaler prn  . Insomnia     takes Xanax prn  . Lung cancer dx'd 2012  . Brain cancer   . Anxiety   . Arthritis   . COPD (chronic obstructive pulmonary disease)     ALLERGIES:  has No Known Allergies.  MEDICATIONS:  Current Outpatient Prescriptions  Medication Sig Dispense Refill  . albuterol (PROVENTIL) (2.5 MG/3ML) 0.083% nebulizer solution Take 3 mLs (2.5 mg total) by nebulization every 4 (four) hours as needed for wheezing or shortness of breath. 75 mL 3  . ALPRAZolam (XANAX) 0.25 MG tablet Take 1 tablet (0.25 mg total) by mouth at bedtime as needed for anxiety. 45 tablet 0  . benzonatate (TESSALON PERLES) 100 MG capsule Take 1 capsule (100 mg total) by mouth 3 (three) times daily as needed for cough. (Patient not taking: Reported on 06/02/2015) 30 capsule 0  . dexamethasone (DECADRON) 4 MG tablet Take 1 tablet (4 mg total)  by mouth 2 (two) times daily. 60 tablet 5  . diazepam (VALIUM) 5 MG tablet   0  . lisinopril-hydrochlorothiazide (PRINZIDE,ZESTORETIC) 20-25 MG per tablet Take 1 tablet by mouth every morning.     Marland Kitchen oxyCODONE (OXY IR/ROXICODONE) 5 MG immediate release tablet Take 1-3 tablets (5-15 mg total) by mouth every 4 (four) hours as needed for severe pain. 120 tablet 0  .  oxyCODONE-acetaminophen (PERCOCET/ROXICET) 5-325 MG per tablet Take 1-2 tablets by mouth every 4 (four) hours as needed for severe pain. Do not exceed 10 pills in one day. 120 tablet 0  . pravastatin (PRAVACHOL) 40 MG tablet Take 40 mg by mouth at bedtime.     Marland Kitchen SPIRIVA HANDIHALER 18 MCG inhalation capsule   0  . Tiotropium Bromide-Olodaterol (STIOLTO RESPIMAT) 2.5-2.5 MCG/ACT AERS Inhale 2 puffs into the lungs daily. 4 g 5  . traMADol (ULTRAM) 50 MG tablet Take 1 tablet (50 mg total) by mouth every 6 (six) hours as needed. (Patient not taking: Reported on 06/02/2015) 120 tablet 0   No current facility-administered medications for this visit.   Facility-Administered Medications Ordered in Other Visits  Medication Dose Route Frequency Provider Last Rate Last Dose  . topical emolient (BIAFINE) emulsion   Topical Daily Tyler Pita, MD      . topical emolient (BIAFINE) emulsion   Topical PRN Tyler Pita, MD        SURGICAL HISTORY:  Past Surgical History  Procedure Laterality Date  . Left video-assisted thoracoscopy, mini thoracotomy; wedge, posterior segment of left upper lobe; wedge, anterior segment of left lower lobe with node dissection.  09/02/11    [facility]  . Video assisted thoracoscopy Left 05/03/2013    Procedure: REDO VIDEO ASSISTED THORACOSCOPY;  Surgeon: Melrose Nakayama, MD;  Location: [facility];  Service: Thoracic;  Laterality: Left;  REDO LEFT VATS  . Thoracotomy Left 05/03/2013    Procedure: THORACOTOMY MAJOR;  Surgeon: Melrose Nakayama, MD;  Location: [place];  Service: Thoracic;  Laterality: Left;  . Lobectomy Left 05/03/2013    Procedure: LOBECTOMY;  Surgeon: Melrose Nakayama, MD;  Location: [facility];  Service: Thoracic;  Laterality: Left;  . Wedge resection Left 05/03/2013    Procedure: WEDGE RESECTION;  Surgeon: Melrose Nakayama, MD;  Location: [place];  Service: Thoracic;  Laterality: Left;  . Open reduction internal fixation (orif) tibia/fibula fracture Right  07/01/2013    Procedure: OPEN REDUCTION INTERNAL FIXATION TIBIAL  FRACTURE;  Surgeon: Johnn Hai, MD;  Location: [facility];  Service: Orthopedics;  Laterality: Right;  . Craniotomy N/A 11/28/2014    Procedure: CRANIOTOMY TUMOR EXCISION;  Surgeon: Erline Levine, MD;  Location: [facility];  Service: Neurosurgery;  Laterality: N/A;    REVIEW OF SYSTEMS:  A comprehensive review of systems was negative except for: Constitutional: positive for fatigue Musculoskeletal: positive for muscle weakness   PHYSICAL EXAMINATION: General appearance: alert, cooperative, fatigued and no distress Head: Normocephalic, without obvious abnormality, atraumatic Neck: no adenopathy, no JVD, supple, symmetrical, trachea midline and thyroid not enlarged, symmetric, no tenderness/mass/nodules Lymph nodes: Cervical, supraclavicular, and axillary nodes normal. Resp: clear to auscultation bilaterally Back: symmetric, no curvature. ROM normal. No CVA tenderness. Cardio: regular rate and rhythm, S1, S2 normal, no murmur, click, rub or gallop GI: soft, non-tender; bowel sounds normal; no masses,  no organomegaly Extremities: extremities normal, atraumatic, no cyanosis or edema Neurologic: Alert and oriented X 3, normal strength and tone. Normal symmetric reflexes. Normal coordination and gait  ECOG PERFORMANCE STATUS:  1 - Symptomatic but completely ambulatory  Blood pressure 139/75, pulse 117, temperature 98.4 F (36.9 C), temperature source Oral, resp. rate 18, height '6\' 1"'$  (1.854 m), weight 273 lb 9.6 oz (124.104 kg), SpO2 100 %.  LABORATORY DATA: Lab Results  Component Value Date   WBC 14.0* 06/08/2015   HGB 13.6 06/08/2015   HCT 40.9 06/08/2015   MCV 81.2 06/08/2015   PLT 206 06/08/2015      Chemistry      Component Value Date/Time   NA 137 06/08/2015 1531   NA 131* 11/23/2014 1003   NA 138 03/25/2012 1225   K 4.7 06/08/2015 1531   K 4.2 11/23/2014 1003   K 4.2 03/25/2012 1225   CL 97 11/23/2014 1003     CL 105 03/03/2013 1002   CL 98 03/25/2012 1225   CO2 28 06/08/2015 1531   CO2 25 11/23/2014 1003   CO2 26 03/25/2012 1225   BUN 33.6* 06/08/2015 1531   BUN 38* 11/23/2014 1003   BUN 11 03/25/2012 1225   CREATININE 1.5* 06/08/2015 1531   CREATININE 1.67* 11/23/2014 1003   CREATININE 1.0 03/25/2012 1225      Component Value Date/Time   CALCIUM 8.8 06/08/2015 1531   CALCIUM 8.6 11/23/2014 1003   CALCIUM 8.6 03/25/2012 1225   ALKPHOS 82 06/08/2015 1531   ALKPHOS 51 07/02/2013 0455   ALKPHOS 84 03/25/2012 1225   AST 22 06/08/2015 1531   AST 23 07/02/2013 0455   AST 33 03/25/2012 1225   ALT 61* 06/08/2015 1531   ALT 34 07/02/2013 0455   ALT 43 03/25/2012 1225   BILITOT 0.31 06/08/2015 1531   BILITOT 0.5 07/02/2013 0455   BILITOT 0.80 03/25/2012 1225       RADIOGRAPHIC STUDIES: Ct Chest W Contrast  06/08/2015   CLINICAL DATA:  59 year old male with history of non squamous cell lung cancer. Shortness of breath and chest pain. Undergoing radiation therapy.  EXAM: CT CHEST WITH CONTRAST  TECHNIQUE: Multidetector CT imaging of the chest was performed during intravenous contrast administration.  CONTRAST:  17m OMNIPAQUE IOHEXOL 300 MG/ML  SOLN  COMPARISON:  Chest CT 03/06/2015.  FINDINGS: Mediastinum/Lymph Nodes: Heart size is normal. There is no significant pericardial fluid, thickening or pericardial calcification. There is atherosclerosis of the thoracic aorta, the great vessels of the mediastinum and the coronary arteries, including calcified atherosclerotic plaque in the left main, left anterior descending, left circumflex and right coronary arteries. No pathologically enlarged mediastinal or hilar lymph nodes. Esophagus is unremarkable in appearance. No axillary lymphadenopathy.  Lungs/Pleura: Status post left lower lobectomy. Again noted are several small ill-defined areas of ground-glass attenuation in the right lung, similar in size, number and distribution to recent prior  examinations. No developing central solid components in these lesions. No new suspicious appearing pulmonary nodules or masses are otherwise noted. No acute consolidative airspace disease. No pleural effusions.  Upper Abdomen: Unremarkable.  Musculoskeletal/Soft Tissues: There are no aggressive appearing lytic or blastic lesions noted in the visualized portions of the skeleton. Postthoracotomy changes in the left hemithorax again noted.  IMPRESSION: 1. Stable postoperative appearance of the chest following left lower lobectomy, as detailed above. No findings to suggest local recurrence of disease or new metastatic disease in the thorax. 2. Multifocal nonspecific ground-glass attenuation nodules in the right lung are stable compared to prior examinations. Continued attention on future imaging surveillance is recommended to ensure stability. 3. Atherosclerosis, including left main and 3 vessel coronary artery disease. Please note that although  the presence of coronary artery calcium documents the presence of coronary artery disease, the severity of this disease and any potential stenosis cannot be assessed on this non-gated CT examination. Assessment for potential risk factor modification, dietary therapy or pharmacologic therapy may be warranted, if clinically indicated.   Electronically Signed   By: Vinnie Langton M.D.   On: 06/08/2015 17:50    ASSESSMENT AND PLAN: This is a very pleasant 59 years old white male with:   1) metastatic non-small cell lung cancer initially diagnosed as Stage IIB, adenocarcinoma status post resection and completed 4 cycles of adjuvant chemotherapy with cisplatin and Alimta.  He was found in January 2016 to have evidence for metastatic brain lesions and the patient underwent stereotactic radiotherapy as well as craniotomy with resection of the tumor. The CT scan of the chest performed recently showed no evidence for disease progression. I discussed the scan results with the  patient and his wife. I recommended for him to continue on observation with repeat CT scan of the chest in 4 months.  2) metastatic brain lesion: Status post a stereotactic radiotherapy and craniotomy. He also recently underwent whole brain irradiation under the care of Dr. Tammi [name].  He was advised to call immediately if he has any concerning symptoms in the interval. The patient voices understanding of current disease status and treatment options and is in agreement with the current care plan.  All questions were answered. The patient knows to call the clinic with any problems, questions or concerns. We can certainly see the patient much sooner if necessary.  Disclaimer: This note was dictated with voice recognition software. Similar sounding words can inadvertently be transcribed and may not be corrected upon review.

## 2015-06-19 NOTE — Progress Notes (Signed)
Lung cancer with brain mets.Patient added on for pain medication management.Dexamethasone now is 1/2 tab daily for one week started on 06/17/15.Taking 3 tablets of oxy ir every 4 hours for severe headache."I have been waking up in tears".

## 2015-06-19 NOTE — Progress Notes (Signed)
  Radiation Oncology         [phone] ________________________________  Name: [name] [name] MRN: [id number]  Date: 06/19/2015  DOB: May 25, 1956  Chart Note: I reviewed this patient's most recent findings and wanted to take a minute to document my impression. Patient returns today to review pain medication.   Lung cancer with brain mets. Patient added on for pain medication management.Dexamethasone now is 1/2 tab daily for one week started on 06/17/15.Taking 3 tablets of oxy ir every 4 hours for severe headache."I have been waking up in tears".   Patient notes fatigue and significant aches and pains throughout body that wakes him up at night.   I wrote the patient a prescription for OxyContin.   ________________________________  Sheral Apley. Tammi Klippel, M.D.  This document serves as a record of services personally performed by Tyler Pita, MD. It was created on his behalf by Derek Mound, a trained medical scribe. The creation of this record is based on the scribe's personal observations and the provider's statements to them. This document has been checked and approved by the attending provider.

## 2015-06-19 NOTE — Telephone Encounter (Signed)
Gave and printed appt sched and avs fo rpt for DEC....gv barium

## 2015-06-27 ENCOUNTER — Telehealth: Payer: Self-pay | Admitting: Radiation Oncology

## 2015-06-27 NOTE — Telephone Encounter (Signed)
Per Dr. Johny Shears order called in Decadron to [facility]. Directions as follows Decadron at 4 mg QID day 1, then 4 mg TID day 2, then 4 mg BID, qty 30, and no refills. Then, emailed patient's wife informing her of such. Also, explained in the email that If headache does not improve, we can taper off and try something else.  If headache gets better maybe work toward maintenance level of 2 BID. Wife verbalized understanding in email.

## 2015-06-28 ENCOUNTER — Other Ambulatory Visit: Payer: Self-pay | Admitting: Radiation Therapy

## 2015-06-28 ENCOUNTER — Telehealth: Payer: Self-pay | Admitting: Radiation Oncology

## 2015-06-28 DIAGNOSIS — C7931 Secondary malignant neoplasm of brain: Secondary | ICD-10-CM

## 2015-06-28 NOTE — Telephone Encounter (Signed)
Per Dr. Johny Shears order called in a refill of Xanax 0.25 mg. Emailed patient's wife confirming this was done.

## 2015-06-30 ENCOUNTER — Other Ambulatory Visit: Payer: Self-pay | Admitting: Radiation Oncology

## 2015-06-30 DIAGNOSIS — Z9889 Other specified postprocedural states: Secondary | ICD-10-CM

## 2015-06-30 DIAGNOSIS — G8928 Other chronic postprocedural pain: Secondary | ICD-10-CM

## 2015-06-30 DIAGNOSIS — C7931 Secondary malignant neoplasm of brain: Secondary | ICD-10-CM

## 2015-06-30 MED ORDER — OXYCODONE HCL 5 MG PO TABS: 5.0000 mg | ORAL_TABLET | ORAL | Status: DC | PRN

## 2015-07-04 NOTE — Addendum Note (Signed)
Encounter addended by: Knox Royalty, NP on: 07/04/2015  5:19 AM<BR>     Documentation filed: Clinical Notes

## 2015-07-06 ENCOUNTER — Ambulatory Visit: Payer: BLUE CROSS/BLUE SHIELD | Admitting: Radiation Oncology

## 2015-07-13 ENCOUNTER — Telehealth: Payer: Self-pay | Admitting: Emergency Medicine

## 2015-07-13 MED ORDER — TIOTROPIUM BROMIDE-OLODATEROL 2.5-2.5 MCG/ACT IN AERS: 2.0000 | INHALATION_SPRAY | Freq: Every day | RESPIRATORY_TRACT | Status: AC

## 2015-07-13 NOTE — Telephone Encounter (Signed)
Spoke with daughter. Pt needs refill on stiolto (2 puffs once daily). I have sent this in. nothing further needed

## 2015-07-18 ENCOUNTER — Encounter: Payer: Self-pay | Admitting: Radiation Oncology

## 2015-07-18 ENCOUNTER — Telehealth: Payer: Self-pay | Admitting: Radiation Oncology

## 2015-07-18 ENCOUNTER — Other Ambulatory Visit: Payer: Self-pay | Admitting: Radiation Oncology

## 2015-07-18 DIAGNOSIS — Z9889 Other specified postprocedural states: Secondary | ICD-10-CM

## 2015-07-18 DIAGNOSIS — G8928 Other chronic postprocedural pain: Secondary | ICD-10-CM

## 2015-07-18 DIAGNOSIS — C7931 Secondary malignant neoplasm of brain: Secondary | ICD-10-CM

## 2015-07-18 MED ORDER — OXYCODONE HCL 5 MG PO TABS: 5.0000 mg | ORAL_TABLET | ORAL | Status: DC | PRN

## 2015-07-18 NOTE — Telephone Encounter (Signed)
Phoned patient's wife, [name]. Explained the roxicodone refill she requested for her husband is ready for pick up in the rad onc nursing station. She verbalized understanding. Reminded her of the MRI appointment coming up. Also, explained that her husband's follow up with Dr. Tammi Klippel for 07/24/15 has been moved up to 0800 from 0815 to allow time for the patient to meet with [name], NP. Wife reports she saw the appointment on MyChart and has already discussed some of Chaynce's symptoms with her. [name] reports her husband's legs are swelling, he is moving slow, constipated and not sleeping well. [place] continues to take Decadron 4 mg bid. [name] reports that [name], NP offered suggestions to manage these symptoms until they could meet face to face on 07/24/15. [name] denies any additional needs at this time.

## 2015-07-21 ENCOUNTER — Ambulatory Visit (HOSPITAL_COMMUNITY)
Admission: RE | Admit: 2015-07-21 | Discharge: 2015-07-21 | Disposition: A | Discharge status: Home / Self Care | Payer: BLUE CROSS/BLUE SHIELD | Source: Ambulatory Visit | Attending: Radiation Oncology | Admitting: Radiation Oncology

## 2015-07-21 DIAGNOSIS — C7931 Secondary malignant neoplasm of brain: Secondary | ICD-10-CM | POA: Diagnosis present

## 2015-07-21 DIAGNOSIS — G936 Cerebral edema: Secondary | ICD-10-CM | POA: Insufficient documentation

## 2015-07-21 LAB — CREATININE, SERUM
Creatinine, Ser: 1.63 mg/dL — ABNORMAL HIGH (ref 0.61–1.24)
GFR calc Af Amer: 52 mL/min — ABNORMAL LOW (ref >60)
GFR, EST NON AFRICAN AMERICAN: 45 mL/min — AB (ref >60)

## 2015-07-21 MED ORDER — GADOBENATE DIMEGLUMINE 529 MG/ML IV SOLN
20.0000 mL | Freq: Once | INTRAVENOUS | Status: AC | PRN
  Administered 2015-07-21: 20 mL via INTRAVENOUS

## 2015-07-24 ENCOUNTER — Encounter: Payer: Self-pay | Admitting: Radiation Oncology

## 2015-07-24 ENCOUNTER — Ambulatory Visit
Admission: RE | Admit: 2015-07-24 | Discharge: 2015-07-24 | Disposition: A | Discharge status: Home / Self Care | Payer: BLUE CROSS/BLUE SHIELD | Source: Ambulatory Visit | Attending: Family Medicine | Admitting: Family Medicine

## 2015-07-24 ENCOUNTER — Ambulatory Visit
Admission: RE | Admit: 2015-07-24 | Discharge: 2015-07-24 | Disposition: A | Discharge status: Home / Self Care | Payer: BLUE CROSS/BLUE SHIELD | Source: Ambulatory Visit | Attending: Radiation Oncology | Admitting: Radiation Oncology

## 2015-07-24 VITALS — BP 167/89 | HR 93 | Resp 16

## 2015-07-24 DIAGNOSIS — Z7189 Other specified counseling: Secondary | ICD-10-CM | POA: Diagnosis not present

## 2015-07-24 DIAGNOSIS — C7931 Secondary malignant neoplasm of brain: Secondary | ICD-10-CM

## 2015-07-24 DIAGNOSIS — R531 Weakness: Secondary | ICD-10-CM | POA: Diagnosis not present

## 2015-07-24 DIAGNOSIS — G893 Neoplasm related pain (acute) (chronic): Secondary | ICD-10-CM | POA: Diagnosis not present

## 2015-07-24 DIAGNOSIS — Z515 Encounter for palliative care: Secondary | ICD-10-CM

## 2015-07-24 DIAGNOSIS — G8928 Other chronic postprocedural pain: Secondary | ICD-10-CM

## 2015-07-24 DIAGNOSIS — Z9889 Other specified postprocedural states: Secondary | ICD-10-CM

## 2015-07-24 MED ORDER — OXYCODONE HCL ER 20 MG PO T12A: 20.0000 mg | EXTENDED_RELEASE_TABLET | Freq: Two times a day (BID) | ORAL | Status: AC

## 2015-07-24 MED ORDER — OXYCODONE HCL 5 MG PO TABS: 5.0000 mg | ORAL_TABLET | ORAL | Status: AC | PRN

## 2015-07-24 NOTE — Progress Notes (Signed)
Radiation Oncology         [phone] ________________________________  Name: [name] [name] MRN: [id number]  Date: 07/24/2015  DOB: 05/10/1956  Follow-Up Visit Note  CC: [name], MD  Curt Bears, MD  Diagnosis: 59 yo man with recurrent left cerebellar brain metastasis    ICD-9-CM ICD-10-CM   1. Brain metastases 198.3 C79.31 oxyCODONE (OXY IR/ROXICODONE) 5 MG immediate release tablet     OxyCODONE (OXYCONTIN) 20 mg T12A 12 hr tablet  2. Metastasis to brain 198.3 C79.31 oxyCODONE (OXY IR/ROXICODONE) 5 MG immediate release tablet     OxyCODONE (OXYCONTIN) 20 mg T12A 12 hr tablet  3. H/O craniotomy V45.89 Z98.89 oxyCODONE (OXY IR/ROXICODONE) 5 MG immediate release tablet     OxyCODONE (OXYCONTIN) 20 mg T12A 12 hr tablet  4. Chronic post-operative pain 338.28 G89.28 oxyCODONE (OXY IR/ROXICODONE) 5 MG immediate release tablet     OxyCODONE (OXYCONTIN) 20 mg T12A 12 hr tablet   Interval Since Last Radiation:  6 weeks 05/22/2015-06/08/2015 The posterior fossa brain was treated to 35 Gy in 14 fractions of 2.5 Gy  Narrative:  The patient returns today for routine follow-up. Unable to obtain weight. BP slightly elevated. Reports headache is getting better while taking oxycodone ER and IR and decadron. He reports the headaches are located in the back of his head. Reports using a cane at home to ambulate because he doesn't trust his gait. SOB noted. Patient tearful. Difficulty of hearing noted. Taking decadron 4 mg bid. Denies discomfort with swallowing. Scheduled to follow up with [facility] in December. Dr. Vertell Limber and Baruch Goldmann, NP were also present during the encounter.                       ALLERGIES:  has No Known Allergies.  Meds: Current Outpatient Prescriptions  Medication Sig Dispense Refill  . albuterol (PROVENTIL) (2.5 MG/3ML) 0.083% nebulizer solution Take 3 mLs (2.5 mg total) by nebulization every 4 (four) hours as needed for wheezing or shortness of breath. 75 mL 3  .  ALPRAZolam (XANAX) 0.25 MG tablet Take 1 tablet (0.25 mg total) by mouth at bedtime as needed for anxiety. 45 tablet 0  . benzonatate (TESSALON PERLES) 100 MG capsule Take 1 capsule (100 mg total) by mouth 3 (three) times daily as needed for cough. 30 capsule 0  . dexamethasone (DECADRON) 4 MG tablet Take 4 mg by mouth 2 (two) times daily with a meal.    . diazepam (VALIUM) 5 MG tablet   0  . lisinopril-hydrochlorothiazide (PRINZIDE,ZESTORETIC) 20-25 MG per tablet Take 1 tablet by mouth every morning.     Marland Kitchen oxyCODONE (OXY IR/ROXICODONE) 5 MG immediate release tablet Take 1-3 tablets (5-15 mg total) by mouth every 4 (four) hours as needed for severe pain. 240 tablet 0  . OxyCODONE (OXYCONTIN) 20 mg T12A 12 hr tablet Take 1 tablet (20 mg total) by mouth every 12 (twelve) hours. 60 tablet 0  . pravastatin (PRAVACHOL) 40 MG tablet Take 40 mg by mouth at bedtime.     Marland Kitchen SPIRIVA HANDIHALER 18 MCG inhalation capsule   0  . Tiotropium Bromide-Olodaterol (STIOLTO RESPIMAT) 2.5-2.5 MCG/ACT AERS Inhale 2 puffs into the lungs daily. 4 g 0   No current facility-administered medications for this encounter.   Facility-Administered Medications Ordered in Other Encounters  Medication Dose Route Frequency Provider Last Rate Last Dose  . topical emolient (BIAFINE) emulsion   Topical Daily Tyler Pita, MD      . topical emolient (  BIAFINE) emulsion   Topical PRN Tyler Pita, MD        Physical Findings: The patient is in no acute distress. Patient is alert and oriented.  blood pressure is 167/89 and his pulse is 93. His respiration is 16.  Cushingoid features including moon face, and increased abdominal girth and lower extremity edema.  Lab Findings: Lab Results  Component Value Date   WBC 14.0* 06/08/2015   WBC 12.4* 11/23/2014   HGB 13.6 06/08/2015   HGB 13.6 11/23/2014   HCT 40.9 06/08/2015   HCT 40.7 11/23/2014   PLT 206 06/08/2015   PLT 210 11/23/2014    Lab Results  Component Value Date     NA 137 06/08/2015   NA 131* 11/23/2014   NA 138 03/25/2012   K 4.7 06/08/2015   K 4.2 11/23/2014   K 4.2 03/25/2012   CHLORIDE 104 06/08/2015   CO2 28 06/08/2015   CO2 25 11/23/2014   CO2 26 03/25/2012   GLUCOSE 124 06/08/2015   GLUCOSE 113* 11/23/2014   GLUCOSE 107* 03/03/2013   GLUCOSE 99 03/25/2012   BUN 33.6* 06/08/2015   BUN 38* 11/23/2014   BUN 11 03/25/2012   CREATININE 1.63* 07/21/2015   CREATININE 1.5* 06/08/2015   CREATININE 1.0 03/25/2012   BILITOT 0.31 06/08/2015   BILITOT 0.5 07/02/2013   BILITOT 0.80 03/25/2012   ALKPHOS 82 06/08/2015   ALKPHOS 51 07/02/2013   ALKPHOS 84 03/25/2012   AST 22 06/08/2015   AST 23 07/02/2013   AST 33 03/25/2012   ALT 61* 06/08/2015   ALT 34 07/02/2013   ALT 43 03/25/2012   PROT 6.3* 06/08/2015   PROT 5.8* 07/02/2013   PROT 7.4 03/25/2012   ALBUMIN 3.5 06/08/2015   ALBUMIN 2.9* 07/02/2013   CALCIUM 8.8 06/08/2015   CALCIUM 8.6 11/23/2014   CALCIUM 8.6 03/25/2012   ANIONGAP 5 06/08/2015   ANIONGAP 9 11/23/2014    Radiographic Findings: Mr Jeri Cos Wo Contrast  07/21/2015   CLINICAL DATA:  Lung cancer with brain metastases. Status post non conformal radiation to the brain.  SRS to a right parietal lesion and a left cerebellar lesion.  EXAM: MRI HEAD WITHOUT AND WITH CONTRAST  TECHNIQUE: Multiplanar, multiecho pulse sequences of the brain and surrounding structures were obtained without and with intravenous contrast.  CONTRAST:  43m MULTIHANCE GADOBENATE DIMEGLUMINE 529 MG/ML IV SOLN  COMPARISON:  MRI brain 04/21/2015.  FINDINGS: There is significant reduction in in dural and leptomeningeal enhancement along the left lateral aspect of the cerebellum. Minimal parenchymal enhancement is again noted. Maximal thickness of the enhancement previously measured 21 mm. On today's study this is 12 mm. No new enhancement is evident within the cerebellum. A smaller focus of enhancement along the inferior aspect of the cerebellum no measures  5 mm, also reduced in size.  A focus of enhancement subjacent to the right parietal resection site has markedly increased in size since the prior exam. This lesion now measures 17 x 18 x 19 mm. Surrounding vasogenic edema has significantly increased as well.  No other supratentorial lesions are evident.  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  Mild generalized atrophy is again noted. T2 changes in the left cerebellum have significantly decreased since the prior exam.  Flow is present in the major intracranial arteries. The globes orbits are intact. Circumferential mucosal thickening is present in the left maxillary sinus. Remaining paranasal sinuses are clear. There is chronic fluid in the  mastoid air cells bilaterally. No obstructing nasopharyngeal lesion is evident.  IMPRESSION: 1. Marked reduction in the extent of enhancement involving the left lateral cerebellum, meninges, and dura. 2. Interval decrease in size of separate or inferior left cerebellar metastasis. 3. Significant increase in size of recurrent right parietal lesion with progressive surrounding vasogenic edema.   Electronically Signed   By: San Morelle M.D.   On: 07/21/2015 17:41   Impression:  The patient is recovering from the effects of radiation. Mr. Proch has enlargement in his previously treated right parietal site potentially representing tumor recurrence vs. tumor necrosis. He is a candidate for a brain PET scan to help make this distinction.  Plan:  CT scan is scheduled in December and a PET brain scan will be scheduled. I'll see the pt the back to f/u with the PET results. Taper decadron to 2 mg bid.  This document serves as a record of services personally performed by Tyler Pita, MD. It was created on his behalf by Darcus Austin, a trained medical scribe. The creation of this record is based on the scribe's personal observations and the provider's statements to them. This document  has been checked and approved by the attending provider.     _____________________________________  Sheral Apley. Tammi Klippel, M.D.

## 2015-07-24 NOTE — Progress Notes (Signed)
Unable to obtain weight. BP slightly elevated. Reports headache continue despite taking oxycodone ER and IR. Reports using a cane at home to ambulate because he doesn't trust his gait. SOB noted. Moon face, neck edema, and peripheral edema noted. Abdominal distention noted. Patient tearful. Difficulty hearing noted. Taking decadron 4 mg bid. No thrush noted. Denies discomfort with swallowing. Scheduled to follow up with [facility] in December.   BP 167/89 mmHg  Pulse 93  Resp 16 Wt Readings from Last 3 Encounters:  06/19/15 273 lb 14.4 oz (124.24 kg)  06/19/15 273 lb 9.6 oz (124.104 kg)  06/08/15 270 lb (122.471 kg)

## 2015-07-24 NOTE — Consult Note (Signed)
Patient [name] [name]      DOB: Jul 24, 1956      EUM:[id number]     Consult Note from the Palliative Medicine Team at [facility] Requested by:     PCP: [name], MD Reason for Consultation              Phone                                                                                                                                [phone]  Assessment of patients Current state:  This NP [name] reviewed medical records, received report from team, assessed the patient and then meet with [name] [name] and his wife [name] [name] in the outpatient oncology clinic.  Consult is a f/u visit.  This NP has meet with [name] [name] and his wife in the past and has spoken by telephone with [name] [name] offering emotional support many times. This illness has been a physical, emotional and financial strain to the entire family.  Today [name] [name] tells me he is "doing OK" but his wife and Dr Johny Shears nursing staff have concerns.  He is more unsteady on his feet, is having significant fluid retention 2/2 to steroids,  and it seems that his prescription for pain medications is just about making it to the 30 day refill time.  [name] [name] tells me his pain control is "ok" today, but I have asked the family to be specific and deliberate about doses and times in order to make adjustment in effort for better pain control.  His wife plans to make a chart to help with more accurate mediation management   Family encouraged to call with questions or concerns.  PMT will continue to support holistically.    Symptom Management:  1. Pain:Pain: Continue Percocet 5-325 mg one tablet every 4 hrs prn-we discussed use of of long acting agents as strategy for better pain control, at this time is wishes to continue with his current regime. He is utilizing 6-8 tablets a day.            -decrease Dexamethasone to 2 mg po bid Re-evaluate in two weeks for adjustments   1. Bowel Regimen: Senna-s 1-2 tablets qhs prn- discussed  opiod induced constipation.  Will consider Relistor if continued constipation  5. Psychosocial:  Emotional support offered to patient and his wife.  Both struggle with the difficulty of living with seroius life threatening disease.   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  2) metastatic non-small cell lung cancer, adenocarcinoma initially diagnosed as stage IIB (T3, N1, M0) in June of 2014.   PRIOR THERAPY:  1) Status post 3 cycles of systemic chemotherapy with carboplatin and etoposide. Last dose was given on 07/16/2011.  2) Left video-assisted thoracoscopy, mini thoracotomy; wedge, posterior segment of left upper lobe;  wedge, anterior segment of left lower lobe with node dissection.  3) Redo left video-assisted thoracoscopy, lysis of adhesions, wedge resection of left lower lobe, thoracoscopic left lower lobectomy under the care of Dr. Roxan Hockey on 05/03/2013.  4) Adjuvant chemotherapy with cisplatin 75 mg/M2 and Alimta 500 mg/M2 every 3 weeks, first dose given 06/28/2013. Status post 4 cycles. 5) status post stereotactic radiotherapy as well as craniotomy with tumor excision under the care of Dr. Tammi Klippel and Dr. Vertell Limber on 11/28/2014 and the final pathology was consistent with metastatic adenocarcinoma of lung primary. 6) whole brain irradiation under the care of Dr. Tammi Klippel completed 06/08/2015 for recurrent brain metastases  CURRENT THERAPY: Observation.  Continued physical, functional and cognitive decline 2/2 to serious illness, its treatments and side effects.     ROS:  Weakness, fatigue, weight gain    PMH:  Past Medical History  Diagnosis Date  . Hypercholesteremia     takes Pravastatin nightly  . HTN (hypertension)     takes Lisinopril and HCTZ daily  . Shortness of breath     with exertion;Albuterol inhaler prn  . Insomnia     takes Xanax prn  . Lung  cancer dx'd 2012  . Brain cancer   . Anxiety   . Arthritis   . COPD (chronic obstructive pulmonary disease)      PSH: Past Surgical History  Procedure Laterality Date  . Left video-assisted thoracoscopy, mini thoracotomy; wedge, posterior segment of left upper lobe; wedge, anterior segment of left lower lobe with node dissection.  09/02/11    [facility]  . Video assisted thoracoscopy Left 05/03/2013    Procedure: REDO VIDEO ASSISTED THORACOSCOPY;  Surgeon: Melrose Nakayama, MD;  Location: [place];  Service: Thoracic;  Laterality: Left;  REDO LEFT VATS  . Thoracotomy Left 05/03/2013    Procedure: THORACOTOMY MAJOR;  Surgeon: Melrose Nakayama, MD;  Location: [place];  Service: Thoracic;  Laterality: Left;  . Lobectomy Left 05/03/2013    Procedure: LOBECTOMY;  Surgeon: Melrose Nakayama, MD;  Location: [facility];  Service: Thoracic;  Laterality: Left;  . Wedge resection Left 05/03/2013    Procedure: WEDGE RESECTION;  Surgeon: Melrose Nakayama, MD;  Location: [place];  Service: Thoracic;  Laterality: Left;  . Open reduction internal fixation (orif) tibia/fibula fracture Right 07/01/2013    Procedure: OPEN REDUCTION INTERNAL FIXATION TIBIAL  FRACTURE;  Surgeon: Johnn Hai, MD;  Location: [facility];  Service: Orthopedics;  Laterality: Right;  . Craniotomy N/A 11/28/2014    Procedure: CRANIOTOMY TUMOR EXCISION;  Surgeon: Erline Levine, MD;  Location: [place] [facility];  Service: Neurosurgery;  Laterality: N/A;   I have reviewed the [place] and SH and  If appropriate update it with new information. No Known Allergies Scheduled Meds: Continuous Infusions: PRN Meds:.   There were no vitals taken for this visit.    No intake or output data in the 24 hours ending 07/24/15 1319 LBM: 2 days ago per patient                       Stool Softner:  Senna -s  Physical Exam:  General: chronically ill appearing, moon faced HEENT:  Moist buccal membranes, without exudate Ext: noted +2 BLE edema Neuro: alert  and oriented X2  Labs: CBC    Component Value Date/Time   WBC 14.0* 06/08/2015 1531   WBC 12.4* 11/23/2014 1003   RBC 5.04 06/08/2015 1531   RBC 5.10 11/23/2014 1003   HGB 13.6  06/08/2015 1531   HGB 13.6 11/23/2014 1003   HCT 40.9 06/08/2015 1531   HCT 40.7 11/23/2014 1003   PLT 206 06/08/2015 1531   PLT 210 11/23/2014 1003   MCV 81.2 06/08/2015 1531   MCV 79.8 11/23/2014 1003   MCH 27.0* 06/08/2015 1531   MCH 26.7 11/23/2014 1003   MCHC 33.3 06/08/2015 1531   MCHC 33.4 11/23/2014 1003   RDW 16.9* 06/08/2015 1531   RDW 14.5 11/23/2014 1003   LYMPHSABS 0.9 06/08/2015 1531   LYMPHSABS 2.7 07/01/2013 1610   MONOABS 0.9 06/08/2015 1531   MONOABS 0.4 07/01/2013 1610   EOSABS 0.0 06/08/2015 1531   EOSABS 0.0 07/01/2013 1610   BASOSABS 0.0 06/08/2015 1531   BASOSABS 0.0 07/01/2013 1610    BMET    Component Value Date/Time   NA 137 06/08/2015 1531   NA 131* 11/23/2014 1003   NA 138 03/25/2012 1225   K 4.7 06/08/2015 1531   K 4.2 11/23/2014 1003   K 4.2 03/25/2012 1225   CL 97 11/23/2014 1003   CL 105 03/03/2013 1002   CL 98 03/25/2012 1225   CO2 28 06/08/2015 1531   CO2 25 11/23/2014 1003   CO2 26 03/25/2012 1225   GLUCOSE 124 06/08/2015 1531   GLUCOSE 113* 11/23/2014 1003   GLUCOSE 107* 03/03/2013 1002   GLUCOSE 99 03/25/2012 1225   BUN 33.6* 06/08/2015 1531   BUN 38* 11/23/2014 1003   BUN 11 03/25/2012 1225   CREATININE 1.63* 07/21/2015 1507   CREATININE 1.5* 06/08/2015 1531   CREATININE 1.0 03/25/2012 1225   CALCIUM 8.8 06/08/2015 1531   CALCIUM 8.6 11/23/2014 1003   CALCIUM 8.6 03/25/2012 1225   GFRNONAA 45* 07/21/2015 1507   GFRAA 52* 07/21/2015 1507    CMP     Component Value Date/Time   NA 137 06/08/2015 1531   NA 131* 11/23/2014 1003   NA 138 03/25/2012 1225   K 4.7 06/08/2015 1531   K 4.2 11/23/2014 1003   K 4.2 03/25/2012 1225   CL 97 11/23/2014 1003   CL 105 03/03/2013 1002   CL 98 03/25/2012 1225   CO2 28 06/08/2015 1531   CO2 25  11/23/2014 1003   CO2 26 03/25/2012 1225   GLUCOSE 124 06/08/2015 1531   GLUCOSE 113* 11/23/2014 1003   GLUCOSE 107* 03/03/2013 1002   GLUCOSE 99 03/25/2012 1225   BUN 33.6* 06/08/2015 1531   BUN 38* 11/23/2014 1003   BUN 11 03/25/2012 1225   CREATININE 1.63* 07/21/2015 1507   CREATININE 1.5* 06/08/2015 1531   CREATININE 1.0 03/25/2012 1225   CALCIUM 8.8 06/08/2015 1531   CALCIUM 8.6 11/23/2014 1003   CALCIUM 8.6 03/25/2012 1225   PROT 6.3* 06/08/2015 1531   PROT 5.8* 07/02/2013 0455   PROT 7.4 03/25/2012 1225   ALBUMIN 3.5 06/08/2015 1531   ALBUMIN 2.9* 07/02/2013 0455   AST 22 06/08/2015 1531   AST 23 07/02/2013 0455   AST 33 03/25/2012 1225   ALT 61* 06/08/2015 1531   ALT 34 07/02/2013 0455   ALT 43 03/25/2012 1225   ALKPHOS 82 06/08/2015 1531   ALKPHOS 51 07/02/2013 0455   ALKPHOS 84 03/25/2012 1225   BILITOT 0.31 06/08/2015 1531   BILITOT 0.5 07/02/2013 0455   BILITOT 0.80 03/25/2012 1225   GFRNONAA 45* 07/21/2015 1507   GFRAA 52* 07/21/2015 1507   ECOG PERFORMANCE STATUS* (Eastern Cooperative Oncology Group)  0 Fully active, able to continue with all pre-disease activities without restriction. Pt score  1 Restricted in physically strenuous activity but ambulatory and able to carry out work of a light or sedentary nature, e.g., light house work, office work.   2 Ambulatory and capable of all self-care but unable to carry out any work activities. Up and about more than 50% of waking hours.  2  3 Capable of only limited self-care. Confined to bed or chair more than 50% of waking hours.   4 Completely disabled. Cannot carry on any self-care. Totally confined to bed or chair.   5 Dead.    As published in Am. J. Clin. Oncol.: Eustace Pen, M.M., Colon Flattery., [place], D.C., [place], Sharen Hint., Drexel Iha, P.P.: Toxicity And Response Criteria Of The [facility] Group. [place] [id number]:[phone], 1982.  The ECOG Performance Status is in the public  domain therefore available for public use. To duplicate the scale, please cite the reference above and credit the [facility], Tyler Pita M.D., Group Chair    Time In Time Out Total Time Spent with Patient Total Overall Time  0800 0915 75 min 75 min    Greater than 50%  of this time was spent counseling and coordinating care related to the above assessment and plan.   [name] NP  Palliative Medicine Team Team Phone # [phone] Pager [phone]  Discussed with Dr Tammi Klippel

## 2015-07-25 ENCOUNTER — Other Ambulatory Visit: Payer: Self-pay | Admitting: Radiation Therapy

## 2015-07-25 DIAGNOSIS — C7931 Secondary malignant neoplasm of brain: Secondary | ICD-10-CM

## 2015-08-03 ENCOUNTER — Ambulatory Visit (INDEPENDENT_AMBULATORY_CARE_PROVIDER_SITE_OTHER): Payer: BLUE CROSS/BLUE SHIELD | Admitting: Emergency Medicine

## 2015-08-03 ENCOUNTER — Encounter: Payer: Self-pay | Admitting: Emergency Medicine

## 2015-08-03 ENCOUNTER — Encounter (HOSPITAL_COMMUNITY)
Admission: RE | Admit: 2015-08-03 | Discharge: 2015-08-03 | Disposition: A | Discharge status: Home / Self Care | Payer: BLUE CROSS/BLUE SHIELD | Source: Ambulatory Visit | Attending: Radiation Oncology | Admitting: Radiation Oncology

## 2015-08-03 VITALS — BP 114/76 | HR 121 | Ht 73.0 in | Wt 286.0 lb

## 2015-08-03 DIAGNOSIS — C7931 Secondary malignant neoplasm of brain: Secondary | ICD-10-CM | POA: Insufficient documentation

## 2015-08-03 DIAGNOSIS — J449 Chronic obstructive pulmonary disease, unspecified: Secondary | ICD-10-CM | POA: Diagnosis not present

## 2015-08-03 DIAGNOSIS — C3412 Malignant neoplasm of upper lobe, left bronchus or lung: Secondary | ICD-10-CM | POA: Diagnosis not present

## 2015-08-03 MED ORDER — FLUDEOXYGLUCOSE F - 18 (FDG) INJECTION
9.9000 | Freq: Once | INTRAVENOUS | Status: DC | PRN
  Administered 2015-08-03: 9.9 via INTRAVENOUS
  Filled 2015-08-03: qty 9.9

## 2015-08-03 NOTE — Assessment & Plan Note (Signed)
Seems to have benefited from [facility], still has SOB even at baseline - multifactorial. Add SABA to travel with, continue albuterol nebs prn.

## 2015-08-03 NOTE — Assessment & Plan Note (Signed)
Following with Rad Onc and Oncology

## 2015-08-03 NOTE — Patient Instructions (Addendum)
We will continue Stiolto 2 puffs daily Use your albuterol nebs up to every 4 hours if needed for shortness of breath Use albuterol Respi-Click, 2 puffs up to every 4 hours if needed for shortness of breath.  Get the flu shot this Fall Follow with Dr Lamonte Sakai in 3 months or sooner if you have any problems.

## 2015-08-03 NOTE — Progress Notes (Signed)
Subjective:    Patient ID: [name] [name], male    DOB: Nov 19, 1955, 59 y.o.   MRN: [id number]  HPI 59 yo man, former smoker (20 pk-yrs), hx poorly differentiated lung CA stage IB in 7/'12, then stage IIB adenoCA in 6/14. Underwent chemo, wedge resection LUL and LLL and then LLL lobectomy in 6/14. His last CT scan was 03/25/14 and was reassuring. He is referred by Dr Julien Nordmann for COPD and dyspnea.    He has noticed that his exertional tolerance has decreased, especially after his surgery in June '14. He has wheeze with exertion and also at rest. He has some cough but not a lot. Also with residual neuropathic L sided pain.   PFT 04/20/13  (pre-op)> severe AFL with positive BD response, normal volumes, normal DLCO.   ROV 04/26/14 -- follow up for lung CA with rx as above, severe AFL and COPD. Last time we started Spiriva to see if he would benefit. He tells me that the spiriva has helped with his exertional SOB and his wheeze. His congestion has improved. He uses albuterol via neb a few times times a week. He is due for CT chest in August '15.   ROV 09/14/14 -- follow up visit for severe COPD. He has a hx lung CA s/p chemo, LUL wedge and then LLL lobectomy.  He had CT scan 06/23/14 that was stable. He was treated for PNA in August, was not admitted. He is on Spiriva qd, uses albuterol prn but does not take a full dose, just a fraction of a full vial. He has wheeze that happens at random, sometimes better with cough or throat clearing. He has not restarted cigarettes, has been off for > 1 yr. He is due to see Dr Julien Nordmann in December.   ROV 08/03/15 -- follow-up visit for stage IV non-small cell lung cancer being followed by Dr. Julien Nordmann. He also has severe COPD. At his last visit we did a trial changing Spiriva to [employer]. He feels that he mayt have benefited. He still has wheeze, depends on his activity level. He has experienced brain mets, has undergone surgery then XRT. No currently on chemo. He is on  steroids. Uses albuterol nebs prn about 3x a week. He had to go to the ED last year for an AE.   He underwent a PET scan of the brain today 08/03/15 that showed abnormal increased uptake in the posterior right parietal and left lateral cerebellum, meninges and dura.   Review of Systems  Constitutional: Negative for fever and unexpected weight change.  HENT: Positive for congestion. Negative for dental problem, ear pain, nosebleeds, postnasal drip, rhinorrhea, sinus pressure, sneezing, sore throat and trouble swallowing.   Eyes: Negative for redness and itching.  Respiratory: Positive for shortness of breath. Negative for cough, chest tightness and wheezing.   Cardiovascular: Positive for chest pain and leg swelling. Negative for palpitations.  Gastrointestinal: Negative for nausea and vomiting.  Genitourinary: Negative for dysuria.  Musculoskeletal: Negative for joint swelling.  Skin: Negative for rash.  Neurological: Negative for headaches.  Hematological: Does not bruise/bleed easily.  Psychiatric/Behavioral: Negative for dysphoric mood. The patient is not nervous/anxious.        Objective:   Physical Exam Filed Vitals:   08/03/15 1505  BP: 114/76  Pulse: 121  Height: '6\' 1"'$  (1.854 m)  Weight: 286 lb (129.729 kg)  SpO2: 97%   Gen: Pleasant, well-nourished, in no distress,  normal affect  ENT: No lesions,  mouth clear,  oropharynx clear, no postnasal drip  Neck: No JVD, no TMG, no carotid bruits  Lungs: No use of accessory muscles, very decreased on the L, clear without rales or rhonchi  Cardiovascular: RRR, heart sounds normal, no murmur or gallops, no peripheral edema  Musculoskeletal: symmetric edema on the R LE post-leg fracture, no cyanosis or clubbing  Neuro: alert, non focal  Skin: Warm, no lesions or rashes    06/23/14 --  COMPARISON: 03/25/2014 and 12/24/2013  FINDINGS: Lungs/Pleura: Postsurgical changes about the left upper lobe. No evidence of locally  recurrent disease.  Right apical ground-glass nodule is unchanged at 1.0 cm on image 11.  Multiple other ground-glass pulmonary nodules are also identified. Example at 1.3 cm on image 25 in the inferior right upper lobe.  5 mm right lower lobe lung nodule on image 45 is not significantly changed  No pleural fluid.  Heart/Mediastinum: Normal heart size, without pericardial effusion. Multivessel coronary artery atherosclerosis. No mediastinal or definite hilar adenopathy, given limitations of unenhanced CT.  Upper Abdomen: Normal imaged portions of the liver, spleen, stomach, pancreas, gallbladder, adrenal glands, kidneys.  Bones/Musculoskeletal: Postsurgical changes of left-sided ribs. Healing fracture of the posterior lateral right sixth rib on image 27. This is new since 12/24/2013, without underlying suspicious lesion on that exam.  IMPRESSION: 1. Postsurgical changes of left upper lobe wedge resection, without locally recurrent or metastatic disease. 2. Similar appearance of right-sided ground-glass nodules. These are indeterminate and warrant followup attention. 3. Healing right sixth rib fracture, new since 12/24/2013. Correlate with interval trauma. No underlying osseous lesion identified on the study from February.     Assessment & Plan:  COPD (chronic obstructive pulmonary disease) Seems to have benefited from [facility], still has SOB even at baseline - multifactorial. Add SABA to travel with, continue albuterol nebs prn.   Small-cell and non-small-cell cancer left   Following with Rad Onc and Oncology

## 2015-08-04 ENCOUNTER — Encounter (HOSPITAL_COMMUNITY): Payer: Self-pay

## 2015-08-04 ENCOUNTER — Emergency Department (HOSPITAL_COMMUNITY): Payer: BLUE CROSS/BLUE SHIELD

## 2015-08-04 ENCOUNTER — Observation Stay (HOSPITAL_COMMUNITY)
Admission: EM | Admit: 2015-08-04 | Discharge: 2015-08-06 | Disposition: A | Discharge status: Home / Self Care | Payer: BLUE CROSS/BLUE SHIELD | Attending: Internal Medicine | Admitting: Internal Medicine

## 2015-08-04 ENCOUNTER — Ambulatory Visit
Admission: RE | Admit: 2015-08-04 | Payer: BLUE CROSS/BLUE SHIELD | Source: Ambulatory Visit | Admitting: Radiation Oncology

## 2015-08-04 ENCOUNTER — Other Ambulatory Visit: Payer: Self-pay

## 2015-08-04 DIAGNOSIS — R338 Other retention of urine: Secondary | ICD-10-CM

## 2015-08-04 DIAGNOSIS — I129 Hypertensive chronic kidney disease with stage 1 through stage 4 chronic kidney disease, or unspecified chronic kidney disease: Secondary | ICD-10-CM | POA: Diagnosis not present

## 2015-08-04 DIAGNOSIS — Z87891 Personal history of nicotine dependence: Secondary | ICD-10-CM | POA: Insufficient documentation

## 2015-08-04 DIAGNOSIS — Z79891 Long term (current) use of opiate analgesic: Secondary | ICD-10-CM | POA: Insufficient documentation

## 2015-08-04 DIAGNOSIS — R000 Tachycardia, unspecified: Secondary | ICD-10-CM

## 2015-08-04 DIAGNOSIS — N179 Acute kidney failure, unspecified: Secondary | ICD-10-CM | POA: Insufficient documentation

## 2015-08-04 DIAGNOSIS — Z7952 Long term (current) use of systemic steroids: Secondary | ICD-10-CM | POA: Diagnosis not present

## 2015-08-04 DIAGNOSIS — G893 Neoplasm related pain (acute) (chronic): Secondary | ICD-10-CM | POA: Insufficient documentation

## 2015-08-04 DIAGNOSIS — R531 Weakness: Principal | ICD-10-CM | POA: Insufficient documentation

## 2015-08-04 DIAGNOSIS — N182 Chronic kidney disease, stage 2 (mild): Secondary | ICD-10-CM | POA: Insufficient documentation

## 2015-08-04 DIAGNOSIS — D649 Anemia, unspecified: Secondary | ICD-10-CM | POA: Insufficient documentation

## 2015-08-04 DIAGNOSIS — Z85118 Personal history of other malignant neoplasm of bronchus and lung: Secondary | ICD-10-CM | POA: Insufficient documentation

## 2015-08-04 DIAGNOSIS — F419 Anxiety disorder, unspecified: Secondary | ICD-10-CM | POA: Diagnosis not present

## 2015-08-04 DIAGNOSIS — R0902 Hypoxemia: Secondary | ICD-10-CM | POA: Insufficient documentation

## 2015-08-04 DIAGNOSIS — J449 Chronic obstructive pulmonary disease, unspecified: Secondary | ICD-10-CM | POA: Diagnosis present

## 2015-08-04 DIAGNOSIS — R627 Adult failure to thrive: Secondary | ICD-10-CM | POA: Insufficient documentation

## 2015-08-04 DIAGNOSIS — M1990 Unspecified osteoarthritis, unspecified site: Secondary | ICD-10-CM | POA: Diagnosis not present

## 2015-08-04 DIAGNOSIS — R339 Retention of urine, unspecified: Secondary | ICD-10-CM | POA: Insufficient documentation

## 2015-08-04 DIAGNOSIS — C7931 Secondary malignant neoplasm of brain: Secondary | ICD-10-CM | POA: Diagnosis not present

## 2015-08-04 DIAGNOSIS — E860 Dehydration: Secondary | ICD-10-CM | POA: Insufficient documentation

## 2015-08-04 DIAGNOSIS — G4733 Obstructive sleep apnea (adult) (pediatric): Secondary | ICD-10-CM | POA: Insufficient documentation

## 2015-08-04 DIAGNOSIS — E7800 Pure hypercholesterolemia, unspecified: Secondary | ICD-10-CM | POA: Diagnosis present

## 2015-08-04 DIAGNOSIS — C3490 Malignant neoplasm of unspecified part of unspecified bronchus or lung: Secondary | ICD-10-CM | POA: Diagnosis present

## 2015-08-04 DIAGNOSIS — Z79899 Other long term (current) drug therapy: Secondary | ICD-10-CM | POA: Insufficient documentation

## 2015-08-04 DIAGNOSIS — Z6837 Body mass index (BMI) 37.0-37.9, adult: Secondary | ICD-10-CM | POA: Diagnosis not present

## 2015-08-04 DIAGNOSIS — I10 Essential (primary) hypertension: Secondary | ICD-10-CM | POA: Diagnosis present

## 2015-08-04 DIAGNOSIS — K5900 Constipation, unspecified: Secondary | ICD-10-CM | POA: Insufficient documentation

## 2015-08-04 LAB — COMPREHENSIVE METABOLIC PANEL
ALK PHOS: 57 U/L (ref 38–126)
ALT: 45 U/L (ref 17–63)
AST: 27 U/L (ref 15–41)
Albumin: 3.8 g/dL (ref 3.5–5.0)
Anion gap: 9 (ref 5–15)
BILIRUBIN TOTAL: 1.2 mg/dL (ref 0.3–1.2)
BUN: 25 mg/dL — ABNORMAL HIGH (ref 6–20)
CALCIUM: 8.8 mg/dL — AB (ref 8.9–10.3)
CHLORIDE: 101 mmol/L (ref 101–111)
CO2: 27 mmol/L (ref 22–32)
CREATININE: 1.37 mg/dL — AB (ref 0.61–1.24)
GFR, EST NON AFRICAN AMERICAN: 55 mL/min — AB (ref >60)
Glucose, Bld: 94 mg/dL (ref 65–99)
Potassium: 3.8 mmol/L (ref 3.5–5.1)
Sodium: 137 mmol/L (ref 135–145)
TOTAL PROTEIN: 6.8 g/dL (ref 6.5–8.1)

## 2015-08-04 LAB — CBC WITH DIFFERENTIAL/PLATELET
Basophils Absolute: 0 10*3/uL (ref 0.0–0.1)
Basophils Relative: 0 %
EOS PCT: 1 %
Eosinophils Absolute: 0.1 10*3/uL (ref 0.0–0.7)
HEMATOCRIT: 41.1 % (ref 39.0–52.0)
Hemoglobin: 13.1 g/dL (ref 13.0–17.0)
LYMPHS ABS: 1.2 10*3/uL (ref 0.7–4.0)
LYMPHS PCT: 14 %
MCH: 27 pg (ref 26.0–34.0)
MCHC: 31.9 g/dL (ref 30.0–36.0)
MCV: 84.6 fL (ref 78.0–100.0)
MONO ABS: 0.8 10*3/uL (ref 0.1–1.0)
Monocytes Relative: 10 %
Neutro Abs: 6.6 10*3/uL (ref 1.7–7.7)
Neutrophils Relative %: 75 %
PLATELETS: 185 10*3/uL (ref 150–400)
RBC: 4.86 MIL/uL (ref 4.22–5.81)
RDW: 16.6 % — ABNORMAL HIGH (ref 11.5–15.5)
WBC: 8.7 10*3/uL (ref 4.0–10.5)

## 2015-08-04 LAB — URINALYSIS, ROUTINE W REFLEX MICROSCOPIC
BILIRUBIN URINE: NEGATIVE
GLUCOSE, UA: NEGATIVE mg/dL
HGB URINE DIPSTICK: NEGATIVE
KETONES UR: NEGATIVE mg/dL
Leukocytes, UA: NEGATIVE
Nitrite: NEGATIVE
PH: 5.5 (ref 5.0–8.0)
Protein, ur: NEGATIVE mg/dL
SPECIFIC GRAVITY, URINE: 1.018 (ref 1.005–1.030)
Urobilinogen, UA: 0.2 mg/dL (ref 0.0–1.0)

## 2015-08-04 LAB — BLOOD GAS, ARTERIAL
Acid-Base Excess: 0.4 mmol/L (ref 0.0–2.0)
Bicarbonate: 23.6 mEq/L (ref 20.0–24.0)
Drawn by: 257701
O2 Content: 2 L/min
O2 Saturation: 97 %
PCO2 ART: 34.7 mmHg — AB (ref 35.0–45.0)
PO2 ART: 89.6 mmHg (ref 80.0–100.0)
Patient temperature: 98.6
TCO2: 21 mmol/L (ref 0–100)
pH, Arterial: 7.446 (ref 7.350–7.450)

## 2015-08-04 LAB — I-STAT TROPONIN, ED: TROPONIN I, POC: 0.01 ng/mL (ref 0.00–0.08)

## 2015-08-04 MED ORDER — SODIUM CHLORIDE 0.9 % IJ SOLN: 3.0000 mL | Freq: Two times a day (BID) | INTRAMUSCULAR | Status: DC

## 2015-08-04 MED ORDER — ALBUTEROL SULFATE (2.5 MG/3ML) 0.083% IN NEBU
5.0000 mg | INHALATION_SOLUTION | Freq: Once | RESPIRATORY_TRACT | Status: AC
  Administered 2015-08-04: 5 mg via RESPIRATORY_TRACT
  Filled 2015-08-04: qty 6

## 2015-08-04 MED ORDER — TIOTROPIUM BROMIDE-OLODATEROL 2.5-2.5 MCG/ACT IN AERS: 2.0000 | INHALATION_SPRAY | Freq: Every day | RESPIRATORY_TRACT | Status: DC

## 2015-08-04 MED ORDER — ACETAMINOPHEN 325 MG PO TABS
650.0000 mg | ORAL_TABLET | Freq: Four times a day (QID) | ORAL | Status: DC | PRN
  Administered 2015-08-04: 650 mg via ORAL
  Filled 2015-08-04: qty 2

## 2015-08-04 MED ORDER — DEXAMETHASONE 2 MG PO TABS
2.0000 mg | ORAL_TABLET | Freq: Two times a day (BID) | ORAL | Status: DC
  Administered 2015-08-04 – 2015-08-06 (×4): 2 mg via ORAL
  Filled 2015-08-04 (×6): qty 1

## 2015-08-04 MED ORDER — ALPRAZOLAM 0.25 MG PO TABS
0.2500 mg | ORAL_TABLET | Freq: Every evening | ORAL | Status: DC | PRN
Start: 2015-08-04 — End: 2015-08-06
  Administered 2015-08-04: 0.25 mg via ORAL
  Filled 2015-08-04: qty 1

## 2015-08-04 MED ORDER — PROMETHAZINE HCL 25 MG PO TABS: 12.5000 mg | ORAL_TABLET | Freq: Four times a day (QID) | ORAL | Status: DC | PRN

## 2015-08-04 MED ORDER — IOHEXOL 350 MG/ML SOLN
100.0000 mL | Freq: Once | INTRAVENOUS | Status: AC | PRN
  Administered 2015-08-04: 100 mL via INTRAVENOUS

## 2015-08-04 MED ORDER — OXYCODONE HCL 5 MG PO TABS
5.0000 mg | ORAL_TABLET | ORAL | Status: DC | PRN
  Administered 2015-08-04 – 2015-08-05 (×2): 10 mg via ORAL
  Administered 2015-08-05 – 2015-08-06 (×2): 15 mg via ORAL
  Filled 2015-08-04: qty 3
  Filled 2015-08-04 (×2): qty 2
  Filled 2015-08-04: qty 3

## 2015-08-04 MED ORDER — SODIUM CHLORIDE 0.9 % IV SOLN
INTRAVENOUS | Status: AC
  Administered 2015-08-04 – 2015-08-05 (×3): via INTRAVENOUS

## 2015-08-04 MED ORDER — SENNA 8.6 MG PO TABS
2.0000 | ORAL_TABLET | Freq: Every day | ORAL | Status: DC
  Administered 2015-08-04 – 2015-08-05 (×2): 17.2 mg via ORAL
  Filled 2015-08-04 (×2): qty 2

## 2015-08-04 MED ORDER — SODIUM CHLORIDE 0.9 % IV BOLUS (SEPSIS)
1000.0000 mL | Freq: Once | INTRAVENOUS | Status: AC
Start: 2015-08-04 — End: 2015-08-04
  Administered 2015-08-04: 1000 mL via INTRAVENOUS

## 2015-08-04 MED ORDER — SODIUM CHLORIDE 0.9 % IV BOLUS (SEPSIS)
1000.0000 mL | Freq: Once | INTRAVENOUS | Status: AC
  Administered 2015-08-04: 1000 mL via INTRAVENOUS

## 2015-08-04 MED ORDER — HYDROMORPHONE HCL 1 MG/ML IJ SOLN
1.0000 mg | Freq: Once | INTRAMUSCULAR | Status: AC
  Administered 2015-08-04: 1 mg via INTRAVENOUS
  Filled 2015-08-04: qty 1

## 2015-08-04 MED ORDER — FLEET ENEMA 7-19 GM/118ML RE ENEM: 1.0000 | ENEMA | Freq: Once | RECTAL | Status: DC | PRN

## 2015-08-04 MED ORDER — POLYETHYLENE GLYCOL 3350 17 G PO PACK
17.0000 g | PACK | Freq: Two times a day (BID) | ORAL | Status: DC
  Administered 2015-08-04 – 2015-08-06 (×4): 17 g via ORAL
  Filled 2015-08-04 (×4): qty 1

## 2015-08-04 MED ORDER — OXYCODONE HCL ER 20 MG PO T12A
20.0000 mg | EXTENDED_RELEASE_TABLET | Freq: Two times a day (BID) | ORAL | Status: DC
  Administered 2015-08-04 – 2015-08-06 (×4): 20 mg via ORAL
  Filled 2015-08-04 (×4): qty 1

## 2015-08-04 MED ORDER — BISACODYL 10 MG RE SUPP
10.0000 mg | Freq: Every day | RECTAL | Status: DC | PRN
  Administered 2015-08-06: 10 mg via RECTAL
  Filled 2015-08-04 (×2): qty 1

## 2015-08-04 MED ORDER — ALBUTEROL SULFATE (2.5 MG/3ML) 0.083% IN NEBU
2.5000 mg | INHALATION_SOLUTION | RESPIRATORY_TRACT | Status: DC | PRN
  Administered 2015-08-05: 2.5 mg via RESPIRATORY_TRACT
  Filled 2015-08-04: qty 3

## 2015-08-04 MED ORDER — FUROSEMIDE 10 MG/ML IJ SOLN: 20.0000 mg | Freq: Once | INTRAMUSCULAR | Status: DC

## 2015-08-04 MED ORDER — ACETAMINOPHEN 650 MG RE SUPP: 650.0000 mg | Freq: Four times a day (QID) | RECTAL | Status: DC | PRN

## 2015-08-04 MED ORDER — ENOXAPARIN SODIUM 60 MG/0.6ML ~~LOC~~ SOLN
60.0000 mg | SUBCUTANEOUS | Status: DC
  Administered 2015-08-04 – 2015-08-05 (×2): 60 mg via SUBCUTANEOUS
  Filled 2015-08-04 (×2): qty 0.6

## 2015-08-04 NOTE — ED Notes (Signed)
Uvaldo Bristle NT performed insertion of foley cath. Megan NT assisted. Pt tolerated. 94m returned from foley

## 2015-08-04 NOTE — Progress Notes (Signed)
  Radiation Oncology         [phone] ________________________________  Name: [name] [name] MRN: [id number]  Date: 08/04/2015  DOB: Aug 26, 1956  Chart Note:  Recent events noted.  Inpatient and ED care greatly appreciated.  Agree with ED recommendation for spine MRI due to urinary retention and weakness to rule out drop mets from recent cerebellar leptomeningeal tumor.    Until MRI completed, I would recommend Q2-4 hour neuro checks focusing on lower extremity sensation to light touch and motor strength to monitor closely for any evidence to suggest clinical spinal cord compression.  If patient can be stabilized and shows improvement, his recurrent right parietal brain met may be amenable to resection to improve vasogenic edema and remove mass effect.  This could allow tapering off decadron at some point in the future.  At this time, I would consider increase decadron to 4 mg QID. ________________________________  [name] [name], M.D.

## 2015-08-04 NOTE — ED Notes (Signed)
Pt has HX of Lung CA with mets to the Brain. Generalized weakness, urinary retention, constipation, generalized cramping x 1 week however increased today. Under Palliative Care. Here for evaluation. Wife at [facility]

## 2015-08-04 NOTE — Progress Notes (Signed)
PHARMACIST - PHYSICIAN COMMUNICATION CONCERNING:  Lovenox  Lovenox order has been adjusted to 0.'5mg'$ /kg/day for BMI > 30.  CrCl >30 ml/min and no issues noted with CBC.    Thanks, Ralene Bathe, PharmD, BCPS 08/04/2015, 6:13 PM  Pager: [phone]

## 2015-08-04 NOTE — ED Notes (Signed)
Pt can go to floor at 17:12

## 2015-08-04 NOTE — ED Provider Notes (Signed)
CSN: [id number]     Arrival date & time 08/04/15  1035 History   First MD Initiated Contact with Patient 08/04/15 1111     Chief Complaint  Patient presents with  . Lung Cancer  . Urinary Retention  . Weakness     (Consider location/radiation/quality/duration/timing/severity/associated sxs/prior Treatment) HPI  59 year old male with a history of lung cancer and brain metastasis presents with progressive generalized weakness over the past couple days. Has also been having difficulty urinating and urinary retention. Denies any abdominal pain. Has chronic constipation but nothing worse than typical. No fevers. Has not had vomiting or diarrhea. The patient has chronic shortness of breath but denies it is worse than typical. No chest pain. Chronic headache from his brain metastasis but this is not worse than normal either. No new focal weakness, just a generalized fatigue and difficulty getting out of bed.  Past Medical History  Diagnosis Date  . Hypercholesteremia     takes Pravastatin nightly  . HTN (hypertension)     takes Lisinopril and HCTZ daily  . Shortness of breath     with exertion;Albuterol inhaler prn  . Insomnia     takes Xanax prn  . Lung cancer dx'd 2012  . Brain cancer   . Anxiety   . Arthritis   . COPD (chronic obstructive pulmonary disease)    Past Surgical History  Procedure Laterality Date  . Left video-assisted thoracoscopy, mini thoracotomy; wedge, posterior segment of left upper lobe; wedge, anterior segment of left lower lobe with node dissection.  09/02/11    [facility]  . Video assisted thoracoscopy Left 05/03/2013    Procedure: REDO VIDEO ASSISTED THORACOSCOPY;  Surgeon: Melrose Nakayama, MD;  Location: [place];  Service: Thoracic;  Laterality: Left;  REDO LEFT VATS  . Thoracotomy Left 05/03/2013    Procedure: THORACOTOMY MAJOR;  Surgeon: Melrose Nakayama, MD;  Location: [place];  Service: Thoracic;  Laterality: Left;  . Lobectomy Left 05/03/2013   Procedure: LOBECTOMY;  Surgeon: Melrose Nakayama, MD;  Location: [place];  Service: Thoracic;  Laterality: Left;  . Wedge resection Left 05/03/2013    Procedure: WEDGE RESECTION;  Surgeon: Melrose Nakayama, MD;  Location: [facility];  Service: Thoracic;  Laterality: Left;  . Open reduction internal fixation (orif) tibia/fibula fracture Right 07/01/2013    Procedure: OPEN REDUCTION INTERNAL FIXATION TIBIAL  FRACTURE;  Surgeon: Johnn Hai, MD;  Location: [facility];  Service: Orthopedics;  Laterality: Right;  . Craniotomy N/A 11/28/2014    Procedure: CRANIOTOMY TUMOR EXCISION;  Surgeon: Erline Levine, MD;  Location: [facility];  Service: Neurosurgery;  Laterality: N/A;   Family History  Problem Relation Age of Onset  . Cancer - Other Mother    Social History  Substance Use Topics  . Smoking status: Former Smoker -- 2.00 packs/day for 40 years    Types: Cigarettes    Quit date: 05/02/2013  . Smokeless tobacco: Former Systems developer    Types: Chew  . Alcohol Use: 0.0 oz/week     Comment: 2-3 beers per day    Review of Systems  Constitutional: Negative for fever.  Respiratory: Positive for shortness of breath.   Cardiovascular: Negative for chest pain.  Gastrointestinal: Negative for vomiting and abdominal pain.  Genitourinary: Positive for decreased urine volume and difficulty urinating. Negative for dysuria.  Neurological: Positive for weakness and headaches.  All other systems reviewed and are negative.     Allergies  Review of patient's allergies indicates no known allergies.  Home Medications   Prior to Admission medications   Medication Sig Start Date End Date Taking? Authorizing Provider  albuterol (PROVENTIL HFA;VENTOLIN HFA) 108 (90 BASE) MCG/ACT inhaler Inhale 2 puffs into the lungs every 6 (six) hours as needed for wheezing or shortness of breath.    Historical Provider, MD  albuterol (PROVENTIL) (2.5 MG/3ML) 0.083% nebulizer solution Take 3 mLs (2.5 mg total) by nebulization  every 4 (four) hours as needed for wheezing or shortness of breath. 04/28/15   Collene Gobble, MD  ALPRAZolam Duanne Moron) 0.25 MG tablet Take 1 tablet (0.25 mg total) by mouth at bedtime as needed for anxiety. 04/28/15   Tyler Pita, MD  benzonatate (TESSALON PERLES) 100 MG capsule Take 1 capsule (100 mg total) by mouth 3 (three) times daily as needed for cough. 12/28/13   Curt Bears, MD  dexamethasone (DECADRON) 4 MG tablet Take 4 mg by mouth 2 (two) times daily with a meal.    Tyler Pita, MD  oxyCODONE (OXY IR/ROXICODONE) 5 MG immediate release tablet Take 1-3 tablets (5-15 mg total) by mouth every 4 (four) hours as needed for severe pain. 07/24/15   Tyler Pita, MD  OxyCODONE (OXYCONTIN) 20 mg T12A 12 hr tablet Take 1 tablet (20 mg total) by mouth every 12 (twelve) hours. 07/24/15   Tyler Pita, MD  Tiotropium Bromide-Olodaterol (STIOLTO RESPIMAT) 2.5-2.5 MCG/ACT AERS Inhale 2 puffs into the lungs daily. 07/13/15   Collene Gobble, MD   BP 140/96 mmHg  Pulse 115  Temp(Src) 97.8 F (36.6 C) (Oral)  Resp 12  Ht '6\' 1"'$  (1.854 m)  Wt 286 lb (129.729 kg)  BMI 37.74 kg/m2  SpO2 89% Physical Exam  Constitutional: He is oriented to person, place, and time. He appears well-developed and well-nourished.  obese  HENT:  Head: Normocephalic and atraumatic.  Right Ear: External ear normal.  Left Ear: External ear normal.  Nose: Nose normal.  Eyes: EOM are normal. Pupils are equal, round, and reactive to light. Right eye exhibits no discharge. Left eye exhibits no discharge.  Neck: Neck supple.  Cardiovascular: Normal rate, regular rhythm, normal heart sounds and intact distal pulses.   Pulmonary/Chest: Effort normal and breath sounds normal.  Abdominal: Soft. He exhibits no distension. There is no tenderness.  Musculoskeletal: He exhibits no edema.  Neurological: He is alert and oriented to person, place, and time.  CN 2-12 grossly intact. Mild weakness in all 4 extremities but strength  is equal diffusely. Normal gross sensation  Skin: Skin is warm and dry.  Nursing note and vitals reviewed.   ED Course  Procedures (including critical care time) Labs Review Labs Reviewed  CBC WITH DIFFERENTIAL/PLATELET - Abnormal; Notable for the following:    RDW 16.6 (*)    All other components within normal limits  COMPREHENSIVE METABOLIC PANEL - Abnormal; Notable for the following:    BUN 25 (*)    Creatinine, Ser 1.37 (*)    Calcium 8.8 (*)    GFR calc non Af Amer 55 (*)    All other components within normal limits  URINALYSIS, ROUTINE W REFLEX MICROSCOPIC (NOT AT [facility]) - Abnormal; Notable for the following:    Color, Urine AMBER (*)    APPearance CLOUDY (*)    All other components within normal limits  BLOOD GAS, ARTERIAL - Abnormal; Notable for the following:    pCO2 arterial 34.7 (*)    All other components within normal limits  I-STAT TROPOININ, ED    Imaging Review Dg Chest  2 View  08/04/2015   CLINICAL DATA:  Patient complaining of diffuse weakness. Also with left upper chest pain as well as shortness of breath. History of left lung carcinoma. History of COPD and hypertension.  EXAM: CHEST  2 VIEW  COMPARISON:  Chest CT, 06/08/2015.  Chest radiograph, 11/28/2014.  FINDINGS: Cardiac silhouette is normal in size and configuration. No mediastinal or hilar masses or evidence of adenopathy.  There is decreased lung volume on the left, stable. Right lung is hyperexpanded. No evidence of pneumonia or pulmonary edema. No pleural effusion or pneumothorax.  Bony thorax is demineralized but grossly intact.  IMPRESSION: 1. No acute cardiopulmonary disease.   Electronically Signed   By: Lajean Manes M.D.   On: 08/04/2015 12:50   Ct Angio Chest Pe W/cm &/or Wo Cm  08/04/2015   CLINICAL DATA:  Hypoxia.  Pt has HX of Lung CA with mets to the Brain. Generalized weakness, urinary retention, constipation, generalized cramping x 1 week however increased today.  Sob  EXAM: CT ANGIOGRAPHY  CHEST WITH CONTRAST  TECHNIQUE: Multidetector CT imaging of the chest was performed using the standard protocol during bolus administration of intravenous contrast. Multiplanar CT image reconstructions and MIPs were obtained to evaluate the vascular anatomy.  CONTRAST:  143m OMNIPAQUE IOHEXOL 350 MG/ML SOLN  COMPARISON:  Chest CT, 06/08/2015  FINDINGS: Angiographic study: No evidence of a pulmonary embolus. Great vessels are normal in caliber. No aortic dissection. Minimal plaque along the aortic arch.  Thoracic inlet:  No mass or adenopathy.  Normal thyroid.  Mediastinum and hila: Heart normal in size and configuration. Minimal pericardial effusion. Moderate coronary artery calcifications. No mediastinal or hilar masses or pathologically enlarged lymph nodes. Multiple surgical vascular clips are noted along the posterior inferior left hilum from the previous left lower lobectomy.  Lungs and pleura: There is volume loss on the left from the left lower lobectomy. Linear and reticular scarring and left posterior lateral pleural thickening is noted similar to the prior study. There is dependent linear opacity in the right lower lobe most consistent with subsegmental atelectasis. There is no lung consolidation to suggest pneumonia. No pulmonary edema. No lung mass or suspicious nodule. No pleural effusion or pneumothorax.  Limited upper abdomen:  Unremarkable.  Musculoskeletal: Bony thorax is demineralized. Mild degenerate spurring along the thoracic spine.  Review of the MIP images confirms the above findings.  IMPRESSION: 1. No evidence of a pulmonary embolism. 2. No acute findings. 3. Stable postsurgical changes from the left lobectomy. 4. Dependent right lower lobe subsegmental atelectasis. No convincing pneumonia and no pulmonary edema.   Electronically Signed   By: DLajean ManesM.D.   On: 08/04/2015 116:10  Nm Pet Metabolic Brain  99/60/4540  CLINICAL DATA:  SUBSEQUENT treatment strategy for BRAIN METASTASIS  SECONDARY TO LUNG CANCER  EXAM: NUCLEAR MEDICINE PET BRAIN  TECHNIQUE: 9.9 mCi F-18 FDG was injected intravenously. Full-ring PET imaging was performed from the vertex to the feet after the radiotracer. CT data was obtained and used for attenuation correction and anatomic localization.  FASTING BLOOD GLUCOSE:  Value:   mg/dl  COMPARISON:  Brain MRI 07/21/2015  FINDINGS: Brain: There is a medium size focus of intense radiotracer uptake within the posterior cerebral hemisphere corresponding to the enlarging recurrent right parietal lesion. This measures approximately 1.3 cm and has an SUV max equal to 16.39.  Within the periphery of the left cerebellar hemisphere there is linear increased radiotracer uptake corresponding to the area of abnormal  enhancement within the left lateral cerebellum, meninges and dura. The SUV max within this area is equal to 12.03. No additional areas of abnormal uptake identified within the brain.  IMPRESSION: 1. There is abnormal increased FDG uptake within the posterior right parietal lesion and left lateral cerebellum, meninges and dura compatible with residual/recurrence of tumor.   Electronically Signed   By: Kerby Moors M.D.   On: 08/03/2015 11:16   I have personally reviewed and evaluated these images and lab results as part of my medical decision-making.   EKG Interpretation   Date/Time:  Friday August 04 2015 11:36:09 EDT Ventricular Rate:  119 PR Interval:  161 QRS Duration: 83 QT Interval:  313 QTC Calculation: 440 R Axis:   76 Text Interpretation:  Sinus tachycardia Probable left atrial enlargement  rate is faster compared to 2014 Confirmed by GOLDSTON  MD, [place] ([id number]) on  08/04/2015 3:14:04 PM      MDM   Final diagnoses:  Urinary retention  Sinus tachycardia  Weakness    Patient is diffusely weak and remains tachycardic despite IV fluids. Patient intermittently mildly hypoxic, CT obtained with no evidence of pulmonary embolus. His retention  could be from constipation or prostate hypertrophy but with his brain metastases there is concern that maybe has a spine med is well. He does not have back pain or lower extremity asymmetric weakness. However he will need MRI. He is too weak to get up out of bed with his significant tachycardia and other comorbidities I feel he would benefit from observation and fluids in the hospital as well as getting the MRI while here. Admit to the hospitalist.    Sherwood Gambler, MD 08/04/15 1640

## 2015-08-04 NOTE — ED Notes (Signed)
Belongings taken to truck by wife

## 2015-08-04 NOTE — Progress Notes (Deleted)
Addendum  Called by RN stating patient appeared to be SOB. Interviewed and examined patient at bedside. He feels generally unwell but denies new onset of dyspnea. States that he has chronic dyspnea on exertion going back for years. He denies cough or chest pain. He does however appear mildly tachypneic with minimal activity and chest exam shows new bibasal crackles. Oxygen saturations are in 90s even on room air. Even though chest x-ray suggests no acute findings, concerned about pulmonary edema from IV fluid resuscitation. Today's dose of diuretics had been held. DC'd IV fluids. We'll provide IV Lasix 20 mg 1 and monitor closely. Patient requests something for sleep-will order a dose of Ambien at night.  Discussed with patient's son and daughter-in-law at bedside. Discussed with patient's RN.  HONGALGI,ANAND, MD, FACP, FHM. [facility] Pager [phone]  If 7PM-7AM, please contact night-coverage [email or web address] Password [email or web address] 08/04/2015, 6:45 PM' 

## 2015-08-04 NOTE — ED Notes (Signed)
Patient transported to X-ray 

## 2015-08-04 NOTE — H&P (Addendum)
History and Physical  [name] [name]:[id number] DOB: 1956-10-19 DOA: 08/04/2015  Referring physician: Dr. Sherwood [name], EDP PCP: [name] Sake, MD  Outpatient Specialists:  1. Medical oncology: Dr. Curt Bears 2. Radiation oncology: Dr. Lisbeth Renshaw 3. Neurosurgery: Dr. Erline Levine 4. Pulmonology: Dr. Baltazar Apo  Chief Complaint: Generalized weakness  HPI: [name] [name] is a 59 y.o. male , married, lives with spouse, PMH of metastatic non-small cell lung cancer status post resection, completed 4 cycles of joint chemotherapy with cisplatin and Alimta, diagnosed with metastatic brain lesions in January 2016-status post stereotactic radiotherapy and craniotomy with resection of tumor, recent CT chest in August showed no evidence of disease progression and is being observed by oncology, completed radiation treatment approximately 3-4 weeks ago, HTN, HLD, anxiety, COPD, former smoker, presented to the [facility] ED on 08/04/15 with complaints of generalized weakness. History is provided by patient and his spouse who is at bedside. According to them, since he completed his radiation therapy approximately 3-4 weeks ago, he has been progressively declining with worsening generalized weakness, generalized tremors, unsteadiness of gait. He used to move around independently but a week ago started using his cane and 4 days ago started using a walker. He has issues with chronic constipation and last BM was 3 days ago. He is passing flatus. Yesterday he saw his pulmonologist and received a flu shot. Since last 3 days, his weakness has worsened. This morning he stated to his wife that he was not feeling well. After she had her left for work, he called her an hour or 2 later stating that he was weak when he couldn't get out of bed and couldn't even raise his head from the bed. He presented to the ED with he was found to have urinary retention and Foley catheter was placed. Patient stated that he had  sensation to urinate but just couldn't urinate. He denies tingling, numbness, asymmetrical limb weakness or a sensory level. He states that he is just generally weak from head to toe. Due to being on steroids, his appetite is voracious but over the last 24 hours his appetite has been poor but no nausea, vomiting, abdominal pain or fever. In the ED, lab work significant for creatinine 1.37, chest x-ray without acute cardiopulmonary disease, CTA chest showed no evidence of pulmonary embolism or acute findings, PET scan done on 9/22 shows abnormal uptake within the posterior right parietal lesion and left lateral cerebellum, meningis and dura compatible with residual/recurrence of tumor. EDP was concerned regarding spinal metastatic stasis causing weakness and discussed his case with the neurosurgeon on call who recommended admission to [facility] for further evaluation. Patient complains of generalized pain but mostly headaches-unchanged, for which she takes chronic opioids. Hospitalist admission was requested. He was intermittently mildly hypoxic in the ED.   Review of Systems: All systems reviewed and apart from history of presenting illness, are negative.  Past Medical History  Diagnosis Date  . Hypercholesteremia     takes Pravastatin nightly  . HTN (hypertension)     takes Lisinopril and HCTZ daily  . Shortness of breath     with exertion;Albuterol inhaler prn  . Insomnia     takes Xanax prn  . Lung cancer dx'd 2012  . Brain cancer   . Anxiety   . Arthritis   . COPD (chronic obstructive pulmonary disease)    Past Surgical History  Procedure Laterality Date  . Left video-assisted thoracoscopy, mini thoracotomy; wedge, posterior segment  of left upper lobe; wedge, anterior segment of left lower lobe with node dissection.  09/02/11    [facility]  . Video assisted thoracoscopy Left 05/03/2013    Procedure: REDO VIDEO ASSISTED THORACOSCOPY;  Surgeon: Melrose Nakayama, MD;  Location:  [place];  Service: Thoracic;  Laterality: Left;  REDO LEFT VATS  . Thoracotomy Left 05/03/2013    Procedure: THORACOTOMY MAJOR;  Surgeon: Melrose Nakayama, MD;  Location: [place];  Service: Thoracic;  Laterality: Left;  . Lobectomy Left 05/03/2013    Procedure: LOBECTOMY;  Surgeon: Melrose Nakayama, MD;  Location: [place];  Service: Thoracic;  Laterality: Left;  . Wedge resection Left 05/03/2013    Procedure: WEDGE RESECTION;  Surgeon: Melrose Nakayama, MD;  Location: [place];  Service: Thoracic;  Laterality: Left;  . Open reduction internal fixation (orif) tibia/fibula fracture Right 07/01/2013    Procedure: OPEN REDUCTION INTERNAL FIXATION TIBIAL  FRACTURE;  Surgeon: Johnn Hai, MD;  Location: [facility];  Service: Orthopedics;  Laterality: Right;  . Craniotomy N/A 11/28/2014    Procedure: CRANIOTOMY TUMOR EXCISION;  Surgeon: Erline Levine, MD;  Location: [place] [facility];  Service: Neurosurgery;  Laterality: N/A;   Social History:  reports that he quit smoking about 2 years ago. His smoking use included Cigarettes. He has a 80 pack-year smoking history. He has quit using smokeless tobacco. His smokeless tobacco use included Chew. He reports that he drinks alcohol. He reports that he does not use illicit drugs. Rest as per history of presenting illness  No Known Allergies  Family History  Problem Relation Age of Onset  . Cancer - Other Mother     Prior to Admission medications   Medication Sig Start Date End Date Taking? Authorizing Provider  albuterol (PROVENTIL HFA;VENTOLIN HFA) 108 (90 BASE) MCG/ACT inhaler Inhale 2 puffs into the lungs every 6 (six) hours as needed for wheezing or shortness of breath.   Yes Historical Provider, MD  albuterol (PROVENTIL) (2.5 MG/3ML) 0.083% nebulizer solution Take 3 mLs (2.5 mg total) by nebulization every 4 (four) hours as needed for wheezing or shortness of breath. 04/28/15  Yes Collene Gobble, MD  ALPRAZolam Duanne Moron) 0.25 MG tablet Take 1 tablet (0.25 mg  total) by mouth at bedtime as needed for anxiety. 04/28/15  Yes Tyler Pita, MD  dexamethasone (DECADRON) 4 MG tablet Take 2 mg by mouth 2 (two) times daily with a meal.    Yes Tyler Pita, MD  OVER THE COUNTER MEDICATION Take 1 tablet by mouth daily. Potassium   Yes Historical Provider, MD  oxyCODONE (OXY IR/ROXICODONE) 5 MG immediate release tablet Take 1-3 tablets (5-15 mg total) by mouth every 4 (four) hours as needed for severe pain. 07/24/15  Yes Tyler Pita, MD  OxyCODONE (OXYCONTIN) 20 mg T12A 12 hr tablet Take 1 tablet (20 mg total) by mouth every 12 (twelve) hours. 07/24/15  Yes Tyler Pita, MD  Tiotropium Bromide-Olodaterol (STIOLTO RESPIMAT) 2.5-2.5 MCG/ACT AERS Inhale 2 puffs into the lungs daily. 07/13/15  Yes Collene Gobble, MD   Physical Exam: Filed Vitals:   08/04/15 1408 08/04/15 1430 08/04/15 1500 08/04/15 1617  BP:  136/79 160/90 149/86  Pulse:  120 125 127  Temp:      TempSrc:      Resp:  '18 13 16  '$ Height:      Weight:      SpO2: 100% 99% 100% 99%   temperature: 97.38F.   General exam: Moderately built and morbidly obese male patient,  lying comfortably propped up on the gurney in no obvious distress.  Head, eyes and ENT: Nontraumatic and normocephalic. Pupils equally reacting to light and accommodation. Oral mucosa dry.  Neck: Supple. No JVD, carotid bruit or thyromegaly.  Lymphatics: No lymphadenopathy.  Respiratory system: Clear to auscultation. No increased work of breathing.  Cardiovascular system: S1 and S2 heard, RRR-mild regular tachycardia. No JVD, murmurs, gallops, clicks. Trace bilateral leg edema.  Gastrointestinal system: Abdomen is nondistended, soft and nontender. Normal bowel sounds heard. No organomegaly or masses appreciated.  Central nervous system: Alert and oriented. No focal neurological deficits.  Extremities: Symmetric 5 x 5 power in upper extremities and at least 4+ by 5 power in bilateral lower extremities. Peripheral  pulses symmetrically felt. Area of bruising over left forearm from recent blood draw.  Skin: No rashes or acute findings.  Musculoskeletal system: Negative exam.  Psychiatry: Pleasant and cooperative.   Labs on Admission:  Basic Metabolic Panel:  Recent Labs Lab 08/04/15 1139  NA 137  K 3.8  CL 101  CO2 27  GLUCOSE 94  BUN 25*  CREATININE 1.37*  CALCIUM 8.8*   Liver Function Tests:  Recent Labs Lab 08/04/15 1139  AST 27  ALT 45  ALKPHOS 57  BILITOT 1.2  PROT 6.8  ALBUMIN 3.8   No results for input(s): LIPASE, AMYLASE in the last 168 hours. No results for input(s): AMMONIA in the last 168 hours. CBC:  Recent Labs Lab 08/04/15 1139  WBC 8.7  NEUTROABS 6.6  HGB 13.1  HCT 41.1  MCV 84.6  PLT 185   Cardiac Enzymes: No results for input(s): CKTOTAL, CKMB, CKMBINDEX, TROPONINI in the last 168 hours.  BNP (last 3 results) No results for input(s): PROBNP in the last 8760 hours. CBG: No results for input(s): GLUCAP in the last 168 hours.  Radiological Exams on Admission: Dg Chest 2 View  08/04/2015   CLINICAL DATA:  Patient complaining of diffuse weakness. Also with left upper chest pain as well as shortness of breath. History of left lung carcinoma. History of COPD and hypertension.  EXAM: CHEST  2 VIEW  COMPARISON:  Chest CT, 06/08/2015.  Chest radiograph, 11/28/2014.  FINDINGS: Cardiac silhouette is normal in size and configuration. No mediastinal or hilar masses or evidence of adenopathy.  There is decreased lung volume on the left, stable. Right lung is hyperexpanded. No evidence of pneumonia or pulmonary edema. No pleural effusion or pneumothorax.  Bony thorax is demineralized but grossly intact.  IMPRESSION: 1. No acute cardiopulmonary disease.   Electronically Signed   By: Lajean Manes M.D.   On: 08/04/2015 12:50   Ct Angio Chest Pe W/cm &/or Wo Cm  08/04/2015   CLINICAL DATA:  Hypoxia.  Pt has HX of Lung CA with mets to the Brain. Generalized weakness,  urinary retention, constipation, generalized cramping x 1 week however increased today.  Sob  EXAM: CT ANGIOGRAPHY CHEST WITH CONTRAST  TECHNIQUE: Multidetector CT imaging of the chest was performed using the standard protocol during bolus administration of intravenous contrast. Multiplanar CT image reconstructions and MIPs were obtained to evaluate the vascular anatomy.  CONTRAST:  110m OMNIPAQUE IOHEXOL 350 MG/ML SOLN  COMPARISON:  Chest CT, 06/08/2015  FINDINGS: Angiographic study: No evidence of a pulmonary embolus. Great vessels are normal in caliber. No aortic dissection. Minimal plaque along the aortic arch.  Thoracic inlet:  No mass or adenopathy.  Normal thyroid.  Mediastinum and hila: Heart normal in size and configuration. Minimal pericardial effusion. Moderate coronary  artery calcifications. No mediastinal or hilar masses or pathologically enlarged lymph nodes. Multiple surgical vascular clips are noted along the posterior inferior left hilum from the previous left lower lobectomy.  Lungs and pleura: There is volume loss on the left from the left lower lobectomy. Linear and reticular scarring and left posterior lateral pleural thickening is noted similar to the prior study. There is dependent linear opacity in the right lower lobe most consistent with subsegmental atelectasis. There is no lung consolidation to suggest pneumonia. No pulmonary edema. No lung mass or suspicious nodule. No pleural effusion or pneumothorax.  Limited upper abdomen:  Unremarkable.  Musculoskeletal: Bony thorax is demineralized. Mild degenerate spurring along the thoracic spine.  Review of the MIP images confirms the above findings.  IMPRESSION: 1. No evidence of a pulmonary embolism. 2. No acute findings. 3. Stable postsurgical changes from the left lobectomy. 4. Dependent right lower lobe subsegmental atelectasis. No convincing pneumonia and no pulmonary edema.   Electronically Signed   By: Lajean Manes M.D.   On: 08/04/2015  40:98   Nm Pet Metabolic Brain  11/29/1476   CLINICAL DATA:  SUBSEQUENT treatment strategy for BRAIN METASTASIS SECONDARY TO LUNG CANCER  EXAM: NUCLEAR MEDICINE PET BRAIN  TECHNIQUE: 9.9 mCi F-18 FDG was injected intravenously. Full-ring PET imaging was performed from the vertex to the feet after the radiotracer. CT data was obtained and used for attenuation correction and anatomic localization.  FASTING BLOOD GLUCOSE:  Value:   mg/dl  COMPARISON:  Brain MRI 07/21/2015  FINDINGS: Brain: There is a medium size focus of intense radiotracer uptake within the posterior cerebral hemisphere corresponding to the enlarging recurrent right parietal lesion. This measures approximately 1.3 cm and has an SUV max equal to 16.39.  Within the periphery of the left cerebellar hemisphere there is linear increased radiotracer uptake corresponding to the area of abnormal enhancement within the left lateral cerebellum, meninges and dura. The SUV max within this area is equal to 12.03. No additional areas of abnormal uptake identified within the brain.  IMPRESSION: 1. There is abnormal increased FDG uptake within the posterior right parietal lesion and left lateral cerebellum, meninges and dura compatible with residual/recurrence of tumor.   Electronically Signed   By: Kerby Moors M.D.   On: 08/03/2015 11:16    EKG: Independently reviewed. Sinus tachycardia at 119 bpm, normal axis and no acute changes. QTC 440 ms.  Assessment/Plan Principal Problem:   Generalized weakness Active Problems:   HTN (hypertension)   Hypercholesteremia   Small-cell and non-small-cell cancer left     COPD (chronic obstructive pulmonary disease)   Brain metastases   Urinary retention   Failure to thrive in adult   Moderate dehydration   Generalized weakness - Likely secondary to multifactorial from dehydration, residual versus recurrence of brain metastasis, acute kidney injury and flu shot that he received yesterday - No focal signs  to suggest spinal cord involvement at this time - Admitted to telemetry due to mild sinus tachycardia. - Aggressive IV fluid hydration - PT and OT evaluation. - We'll eventually need neurosurgical intervention for brain metastases  Dehydration - Continue IV fluids. Likely secondary to poor oral intake.  Acute kidney injury - Secondary to dehydration and urinary retention. IV fluids, Foley catheter and follow BMP in a.m.  Acute urinary retention - May be multifactorial secondary to prostatomegaly, constipation, opioid pain medications and immobility - Continue Foley catheter. Treat underlying cause i.e. constipation. Consider voiding trial in the next 24-48 hours.  Essential  hypertension - Reasonably controlled  COPD - Stable. CTA chest shows no PE or acute findings. - Continue home regimen  Acute on possible chronic respiratory failure with hypoxia - Probably multifactorial from COPD, OHS/OSA, atelectasis. No PE or acute findings on CT chest. - Incentive spirometry. Titrate oxygen  Constipation - Aggressive bowel regimen  Cancer related pain - Continue home opioid pain regimen  Metastatic non-small cell lung cancer/brain metastases - We'll alert the patient's oncologist of his admission.    DVT prophylaxis: Lovenox Code Status:  Full  Family Communication:  Discussed with spouse at bedside  Disposition Plan:  DC home when medically stable   Time spent:  81 minutes  HONGALGI,ANAND, MD, FACP, FHM. [facility] Pager [phone]  If 7PM-7AM, please contact night-coverage [email or web address] Password [facility] 08/04/2015, 5:09 PM

## 2015-08-04 NOTE — ED Notes (Signed)
MD at bedside. 

## 2015-08-05 ENCOUNTER — Observation Stay (HOSPITAL_COMMUNITY): Payer: BLUE CROSS/BLUE SHIELD

## 2015-08-05 DIAGNOSIS — R339 Retention of urine, unspecified: Secondary | ICD-10-CM

## 2015-08-05 DIAGNOSIS — N179 Acute kidney failure, unspecified: Secondary | ICD-10-CM | POA: Diagnosis not present

## 2015-08-05 DIAGNOSIS — R531 Weakness: Secondary | ICD-10-CM | POA: Diagnosis not present

## 2015-08-05 DIAGNOSIS — E860 Dehydration: Secondary | ICD-10-CM | POA: Diagnosis not present

## 2015-08-05 LAB — BASIC METABOLIC PANEL
Anion gap: 5 (ref 5–15)
BUN: 16 mg/dL (ref 6–20)
CHLORIDE: 105 mmol/L (ref 101–111)
CO2: 27 mmol/L (ref 22–32)
CREATININE: 1.26 mg/dL — AB (ref 0.61–1.24)
Calcium: 8.3 mg/dL — ABNORMAL LOW (ref 8.9–10.3)
GFR calc non Af Amer: >60 mL/min (ref >60)
GLUCOSE: 104 mg/dL — AB (ref 65–99)
Potassium: 4 mmol/L (ref 3.5–5.1)
Sodium: 137 mmol/L (ref 135–145)

## 2015-08-05 LAB — CBC
HCT: 35.8 % — ABNORMAL LOW (ref 39.0–52.0)
Hemoglobin: 11.5 g/dL — ABNORMAL LOW (ref 13.0–17.0)
MCH: 27.3 pg (ref 26.0–34.0)
MCHC: 32.1 g/dL (ref 30.0–36.0)
MCV: 85 fL (ref 78.0–100.0)
PLATELETS: 191 10*3/uL (ref 150–400)
RBC: 4.21 MIL/uL — AB (ref 4.22–5.81)
RDW: 16.5 % — ABNORMAL HIGH (ref 11.5–15.5)
WBC: 7.3 10*3/uL (ref 4.0–10.5)

## 2015-08-05 MED ORDER — SODIUM CHLORIDE 0.9 % IV SOLN
INTRAVENOUS | Status: AC
  Administered 2015-08-05: 18:00:00 via INTRAVENOUS

## 2015-08-05 MED ORDER — SENNA 8.6 MG PO TABS
2.0000 | ORAL_TABLET | Freq: Two times a day (BID) | ORAL | Status: DC
  Administered 2015-08-05 – 2015-08-06 (×2): 17.2 mg via ORAL
  Filled 2015-08-05 (×2): qty 2

## 2015-08-05 MED ORDER — TAMSULOSIN HCL 0.4 MG PO CAPS
0.4000 mg | ORAL_CAPSULE | Freq: Every day | ORAL | Status: DC
  Administered 2015-08-05 – 2015-08-06 (×2): 0.4 mg via ORAL
  Filled 2015-08-05 (×2): qty 1

## 2015-08-05 NOTE — Progress Notes (Addendum)
PROGRESS NOTE    [name] [name] UXL:[id number] DOB: 18-Jun-1956 DOA: 08/04/2015 PCP: [name], MD  Outpatient Specialists:  1. Medical oncology: Dr. Curt Bears 2. Radiation oncology: Dr. Lisbeth Renshaw 3. Neurosurgery: Dr. Erline Levine 4. Pulmonology: Dr. Baltazar Apo  HPI/Brief narrative 59 y.o. male , married, lives with spouse, PMH of metastatic non-small cell lung cancer status post resection, completed 4 cycles of joint chemotherapy with cisplatin and Alimta, diagnosed with metastatic brain lesions in January 2016-status post stereotactic radiotherapy and craniotomy with resection of tumor, recent CT chest in August showed no evidence of disease progression and is being observed by oncology, completed radiation treatment approximately 3-4 weeks ago, HTN, HLD, anxiety, COPD, former smoker, presented to the [facility] ED on 08/04/15 with complaints of 3 weeks progressive generalized weakness which worsened over the last week and more so on the morning of admission (no focal extremity weakness or sensory symptoms). Constipation +. Difficulty urinating although had sensation to do so. In the ED, Foley catheter placed for acute urinary retention, lab work significant for creatinine 1.37, chest x-ray without acute cardiopulmonary disease, CTA chest showed no evidence of pulmonary embolism or acute findings, PET scan off head done on 9/22 shows abnormal uptake within the posterior right parietal lesion and left lateral cerebellum, meningis and dura compatible with residual/recurrence of tumor. Admitted for generalized weakness, acute urinary retention, dehydration, acute kidney injury.  Assessment/Plan:  Generalized weakness - Likely secondary to multifactorial from dehydration, residual versus recurrence of brain metastasis, acute kidney injury and flu shot that he received day prior to admission - No focal signs or symptoms to suggest spinal cord involvement at this time: Requested  radiation oncology to consult on 9/24 and if they are concerned regarding this then may need further workup i.e. MRI spine, increasing Decadron dose. - Improved after IV fluid hydration - PT and OT evaluation. - We'll eventually need neurosurgical intervention for brain metastases  Dehydration - Improved after IV fluids. Likely secondary to poor oral intake.  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 IV fluids, Foley catheter and follow BMP in a.m. Improving - Baseline creatinine probably in the 1.4-1.5 range. - Check renal ultrasound.  Acute urinary retention - May be multifactorial secondary to prostatomegaly, constipation, opioid pain medications and immobility - Continue Foley catheter. Treat underlying cause i.e. constipation.  - Discussed with urologist on call: Agree with management. Attempt to DC Foley and voiding trial on 9/25.  Essential hypertension - Reasonably controlled  COPD - Stable. CTA chest shows no PE or acute findings. - Continue home regimen  Acute on possible chronic respiratory failure with hypoxia - Probably multifactorial from COPD, OHS/OSA, atelectasis. No PE or acute findings on CT chest. - Incentive spirometry. Titrate oxygen  Constipation - Aggressive bowel regimen  Cancer related pain - Continue home opioid pain regimen. Controlled  Metastatic non-small cell lung cancer/brain metastases - Alerted the patient's oncologist of his admission.  Anemia - May be dilutional. Follow CBC in a.m.    DVT prophylaxis: Lovenox Code Status: Full  Family Communication: Discussed with spouse at bedside  Disposition Plan: DC home when medically stable   Consultants:  Radiation oncology  Procedures:   Foley catheter 9/23 >  Antibiotics:   None   Subjective:  Feels better. States that he has more energy. Appetite still poor. No BM-offered suppository but wants to hold off for  now.  Objective: Filed Vitals:   08/04/15 1739 08/04/15 1820  08/04/15 2300 08/05/15 0441  BP: 146/80 147/80 138/78 134/82  Pulse: 126  122 103  Temp: 98.6 F (37 C) 98.7 F (37.1 C) 98.8 F (37.1 C) 98 F (36.7 C)  TempSrc: Oral Oral Oral Tympanic  Resp: '14  16 16  '$ Height:  '6\' 1"'$  (1.854 m)    Weight:  130 kg (286 lb 9.6 oz)  129.8 kg (286 lb 2.5 oz)  SpO2: 98%  98% 98%    Intake/Output Summary (Last 24 hours) at 08/05/15 1121 Last data filed at 08/05/15 0700  Gross per 24 hour  Intake 3038.33 ml  Output   5625 ml  Net -2586.67 ml   Filed Weights   08/04/15 1103 08/04/15 1820 08/05/15 0441  Weight: 129.729 kg (286 lb) 130 kg (286 lb 9.6 oz) 129.8 kg (286 lb 2.5 oz)     Exam:  General exam: Moderately built and obese male sitting up comfortably in bed. Respiratory system: Clear. No increased work of breathing. Cardiovascular system: S1 & S2 heard, RRR. No JVD, murmurs, gallops, clicks or pedal edema. Telemetry: Sinus tachycardia on admission in the 120s-improved to low 100s. Gastrointestinal system: Abdomen is nondistended, soft and nontender. Normal bowel sounds heard. Central nervous system: Alert and oriented. No focal neurological deficits. Extremities: Symmetric 5 x 5 power. No sensory deficits.   Data Reviewed: Basic Metabolic Panel:  Recent Labs Lab 08/04/15 1139 08/05/15 0525  NA 137 137  K 3.8 4.0  CL 101 105  CO2 27 27  GLUCOSE 94 104*  BUN 25* 16  CREATININE 1.37* 1.26*  CALCIUM 8.8* 8.3*   Liver Function Tests:  Recent Labs Lab 08/04/15 1139  AST 27  ALT 45  ALKPHOS 57  BILITOT 1.2  PROT 6.8  ALBUMIN 3.8   No results for input(s): LIPASE, AMYLASE in the last 168 hours. No results for input(s): AMMONIA in the last 168 hours. CBC:  Recent Labs Lab 08/04/15 1139 08/05/15 0525  WBC 8.7 7.3  NEUTROABS 6.6  --   HGB 13.1 11.5*  HCT 41.1 35.8*  MCV 84.6 85.0  PLT 185 191   Cardiac Enzymes: No results for input(s): CKTOTAL, CKMB,  CKMBINDEX, TROPONINI in the last 168 hours. BNP (last 3 results) No results for input(s): PROBNP in the last 8760 hours. CBG: No results for input(s): GLUCAP in the last 168 hours.  No results found for this or any previous visit (from the past 240 hour(s)).       Studies: Dg Chest 2 View  08/04/2015   CLINICAL DATA:  Patient complaining of diffuse weakness. Also with left upper chest pain as well as shortness of breath. History of left lung carcinoma. History of COPD and hypertension.  EXAM: CHEST  2 VIEW  COMPARISON:  Chest CT, 06/08/2015.  Chest radiograph, 11/28/2014.  FINDINGS: Cardiac silhouette is normal in size and configuration. No mediastinal or hilar masses or evidence of adenopathy.  There is decreased lung volume on the left, stable. Right lung is hyperexpanded. No evidence of pneumonia or pulmonary edema. No pleural effusion or pneumothorax.  Bony thorax is demineralized but grossly intact.  IMPRESSION: 1. No acute cardiopulmonary disease.   Electronically Signed   By: Lajean Manes M.D.   On: 08/04/2015 12:50   Ct Angio Chest Pe W/cm &/or Wo Cm  08/04/2015   CLINICAL DATA:  Hypoxia.  Pt has HX of Lung CA with mets to the Brain. Generalized weakness, urinary retention, constipation, generalized cramping x 1 week however increased today.  Sob  EXAM: CT ANGIOGRAPHY CHEST WITH CONTRAST  TECHNIQUE: Multidetector CT imaging of the chest was performed using the standard protocol during bolus administration of intravenous contrast. Multiplanar CT image reconstructions and MIPs were obtained to evaluate the vascular anatomy.  CONTRAST:  148m OMNIPAQUE IOHEXOL 350 MG/ML SOLN  COMPARISON:  Chest CT, 06/08/2015  FINDINGS: Angiographic study: No evidence of a pulmonary embolus. Great vessels are normal in caliber. No aortic dissection. Minimal plaque along the aortic arch.  Thoracic inlet:  No mass or adenopathy.  Normal thyroid.  Mediastinum and hila: Heart normal in size and configuration.  Minimal pericardial effusion. Moderate coronary artery calcifications. No mediastinal or hilar masses or pathologically enlarged lymph nodes. Multiple surgical vascular clips are noted along the posterior inferior left hilum from the previous left lower lobectomy.  Lungs and pleura: There is volume loss on the left from the left lower lobectomy. Linear and reticular scarring and left posterior lateral pleural thickening is noted similar to the prior study. There is dependent linear opacity in the right lower lobe most consistent with subsegmental atelectasis. There is no lung consolidation to suggest pneumonia. No pulmonary edema. No lung mass or suspicious nodule. No pleural effusion or pneumothorax.  Limited upper abdomen:  Unremarkable.  Musculoskeletal: Bony thorax is demineralized. Mild degenerate spurring along the thoracic spine.  Review of the MIP images confirms the above findings.  IMPRESSION: 1. No evidence of a pulmonary embolism. 2. No acute findings. 3. Stable postsurgical changes from the left lobectomy. 4. Dependent right lower lobe subsegmental atelectasis. No convincing pneumonia and no pulmonary edema.   Electronically Signed   By: DLajean ManesM.D.   On: 08/04/2015 15:46        Scheduled Meds: . dexamethasone  2 mg Oral BID WC  . enoxaparin (LOVENOX) injection  60 mg Subcutaneous Q24H  . OxyCODONE  20 mg Oral Q12H  . polyethylene glycol  17 g Oral BID  . senna  2 tablet Oral Daily  . sodium chloride  3 mL Intravenous Q12H  . tamsulosin  0.4 mg Oral Daily  . Tiotropium Bromide-Olodaterol  2 puff Inhalation Daily   Continuous Infusions: . sodium chloride 50 mL/hr at 08/05/15 1000    Principal Problem:   Generalized weakness Active Problems:   HTN (hypertension)   Hypercholesteremia   Small-cell and non-small-cell cancer left     COPD (chronic obstructive pulmonary disease)   Brain metastases   Urinary retention   Failure to thrive in adult   Moderate  dehydration    Time spent: 30 minutes    HONGALGI,ANAND, MD, FACP, FHM. [facility] Pager [phone] If 7PM-7AM, please contact night-coverage [email or web address] Password [facility]9/24/2016, 11:21 AM

## 2015-08-05 NOTE — Evaluation (Signed)
Physical Therapy Evaluation Patient Details Name: [name] [name] MRN: [id number] DOB: 1956-10-30 Today's Date: 08/05/2015   History of Present Illness  59 y.o. malw with PMH of metastatic non-small cell lung cancer status post resection, completed 4 cycles of joint chemotherapy with cisplatin and Alimta, diagnosed with metastatic brain lesions in January 2016-status post stereotactic radiotherapy and craniotomy with resection of tumor, recent CT chest in August showed no evidence of disease progression and is being observed by oncology, completed radiation treatment approximately 3-4 weeks ago, HTN, HLD, anxiety, COPD, former smoker and admitted for generalized weakness likely secondary to multifactorial from dehydration, residual versus recurrence of brain metastasis, acute kidney injury and flu shot that he received day prior to admission  Clinical Impression  Pt admitted with above diagnosis. Pt currently with functional limitations due to the deficits listed below (see PT Problem List).  Pt will benefit from skilled PT to increase their independence and safety with mobility to allow discharge to the venue listed below.   Pt ambulated in hallway however requiring RW at this time.  Pt fatigues very quickly and presents with limited endurance.  Pt and spouse report unknown plans for f/u physical therapy at this time due to awaiting further plan of care for cancer treatment (possibly more surgery).       Follow Up Recommendations Outpatient PT;Home health PT;Supervision for mobility/OOB pending pt decision and POC    Equipment Recommendations  Wheelchair (measurements PT);3in1 (PT) (spouse reports 3n1 at home not fitting pt's size )    Recommendations for Other Services       Precautions / Restrictions Precautions Precautions: Fall Restrictions Weight Bearing Restrictions: No      Mobility  Bed Mobility Overal bed mobility: Needs Assistance Bed Mobility: Supine to Sit;Sit to Supine      Supine to sit: HOB elevated;Supervision Sit to supine: Supervision   General bed mobility comments: increased time and effort  Transfers Overall transfer level: Needs assistance Equipment used: Rolling walker (2 wheeled) Transfers: Sit to/from Stand Sit to Stand: Min guard;From elevated surface         General transfer comment: requires UE assist, able to march in place  Ambulation/Gait Ambulation/Gait assistance: Min guard Ambulation Distance (Feet): 80 Feet Assistive device: Rolling walker (2 wheeled) Gait Pattern/deviations: Step-through pattern;Wide base of support;Decreased stride length     General Gait Details: min/guard for safety, required rest break after 40 feet, fatigues very quickly, c/o weakness, low energy, increased reliance on RW  Stairs            Wheelchair Mobility    Modified Rankin (Stroke Patients Only)       Balance Overall balance assessment: Needs assistance         Standing balance support: Bilateral upper extremity supported Standing balance-Leahy Scale: Poor Standing balance comment: using RW at this time                             Pertinent Vitals/Pain Pain Assessment: 0-10 Pain Score: 5  Pain Location: headache Pain Descriptors / Indicators: Aching Pain Intervention(s): Limited activity within patient's tolerance;Monitored during session (RN notified)    Home Living Family/patient expects to be discharged to:: Private residence Living Arrangements: Spouse/significant other Available Help at Discharge: Family;Available PRN/intermittently Type of Home: House Home Access: Ramped entrance     Home Layout: One level Home Equipment: Walker - 2 wheels Additional Comments: has BSC but does not fit patient  Prior Function Level of Independence: Independent               Hand Dominance        Extremity/Trunk Assessment               Lower Extremity Assessment: Generalized weakness (able to  move all LEs equally against gravity, light touch sensation intact)         Communication   Communication: HOH  Cognition Arousal/Alertness: Awake/alert Behavior During Therapy: WFL for tasks assessed/performed Overall Cognitive Status: Within Functional Limits for tasks assessed                      General Comments      Exercises        Assessment/Plan    PT Assessment Patient needs continued PT services  PT Diagnosis Difficulty walking;Generalized weakness   PT Problem List Decreased strength;Decreased activity tolerance;Decreased mobility  PT Treatment Interventions DME instruction;Gait training;Functional mobility training;Patient/family education;Therapeutic activities;Therapeutic exercise;Stair training   PT Goals (Current goals can be found in the Care Plan section) Acute Rehab PT Goals Patient Stated Goal: d/c home tomorrow PT Goal Formulation: With patient/family Time For Goal Achievement: 08/12/15 Potential to Achieve Goals: Good    Frequency Min 3X/week   Barriers to discharge        Co-evaluation               End of Session Equipment Utilized During Treatment: Gait belt Activity Tolerance: Patient limited by fatigue Patient left: in bed;with call bell/phone within reach;with family/visitor present Nurse Communication: Mobility status    Functional Assessment Tool Used: clinical observation Functional Limitation: Mobility: Walking and moving around Mobility: Walking and Moving Around Current Status [phone]): At least 1 percent but less than 20 percent impaired, limited or restricted Mobility: Walking and Moving Around Goal Status [phone]): 0 percent impaired, limited or restricted    Time: 4383-7793 PT Time Calculation (min) (ACUTE ONLY): 17 min   Charges:   PT Evaluation $Initial PT Evaluation Tier I: 1 Procedure     PT G Codes:   PT G-Codes **NOT FOR INPATIENT CLASS** Functional Assessment Tool Used: clinical  observation Functional Limitation: Mobility: Walking and moving around Mobility: Walking and Moving Around Current Status (P6886): At least 1 percent but less than 20 percent impaired, limited or restricted Mobility: Walking and Moving Around Goal Status [phone]): 0 percent impaired, limited or restricted    Valla Pacey,KATHrine E 08/05/2015, 12:55 PM Carmelia Bake, PT, DPT 08/05/2015 Pager: [phone]

## 2015-08-05 NOTE — Progress Notes (Signed)
The patient was seen and examined today. He describes to me worsening generalized weakness and fatigue after taking a flu shot earlier this past week. He states that he felt like he had the flu and stayed in bed for a couple of days.Ultimately he came to the emergency room with the above complaints as well as constipation and urinary retention. He currently states that he feels much better today. He was able to get up and walk the halls today although he became quite fatigued. No complaints of difficulty with lower or upper extremity weakness. The patient denies any numbness/tingling.  On exam, the patient has 5 out of 5 strength in the upper extremities and 4+ out of 5 strength in the lower extremities. Sensation intact bilaterally in the upper and lower extremities.  On admission, concern was present for possible spinal cord compression. Given his clinical course in house and his current exam, I do not have clinical suspicion for this at this time. Given his improvement, MRI scan of the spine and changing his Decadron does not appear to be necessary. I discussed this with the patient. Urinary retention is continuing to be managed by urology. The patient appears to be a candidate for resection of an enlarging brain tumor which has been discussed with him and this I believe can be managed as an outpatient after discharge. This does not appear to be directly related to his admitting complaints. If the patient develops worsening weakness then I would advocate proceeding with an MRI scan of the spine and increasing his Decadron as was initially considered.

## 2015-08-06 DIAGNOSIS — R531 Weakness: Secondary | ICD-10-CM | POA: Diagnosis not present

## 2015-08-06 DIAGNOSIS — E860 Dehydration: Secondary | ICD-10-CM | POA: Diagnosis not present

## 2015-08-06 DIAGNOSIS — R339 Retention of urine, unspecified: Secondary | ICD-10-CM | POA: Diagnosis not present

## 2015-08-06 LAB — CBC
HCT: 35.2 % — ABNORMAL LOW (ref 39.0–52.0)
HEMOGLOBIN: 11.4 g/dL — AB (ref 13.0–17.0)
MCH: 27.2 pg (ref 26.0–34.0)
MCHC: 32.4 g/dL (ref 30.0–36.0)
MCV: 84 fL (ref 78.0–100.0)
PLATELETS: 223 10*3/uL (ref 150–400)
RBC: 4.19 MIL/uL — AB (ref 4.22–5.81)
RDW: 15.9 % — ABNORMAL HIGH (ref 11.5–15.5)
WBC: 7.6 10*3/uL (ref 4.0–10.5)

## 2015-08-06 LAB — BASIC METABOLIC PANEL
Anion gap: 6 (ref 5–15)
BUN: 15 mg/dL (ref 6–20)
CHLORIDE: 106 mmol/L (ref 101–111)
CO2: 26 mmol/L (ref 22–32)
CREATININE: 1.24 mg/dL (ref 0.61–1.24)
Calcium: 8.5 mg/dL — ABNORMAL LOW (ref 8.9–10.3)
Glucose, Bld: 105 mg/dL — ABNORMAL HIGH (ref 65–99)
POTASSIUM: 4.1 mmol/L (ref 3.5–5.1)
SODIUM: 138 mmol/L (ref 135–145)

## 2015-08-06 MED ORDER — POLYETHYLENE GLYCOL 3350 17 G PO PACK: 17.0000 g | PACK | Freq: Two times a day (BID) | ORAL | Status: AC

## 2015-08-06 MED ORDER — BISACODYL 10 MG RE SUPP: 10.0000 mg | Freq: Every day | RECTAL | Status: AC | PRN

## 2015-08-06 MED ORDER — SENNA 8.6 MG PO TABS: 2.0000 | ORAL_TABLET | Freq: Every day | ORAL | Status: AC | PRN

## 2015-08-06 MED ORDER — TAMSULOSIN HCL 0.4 MG PO CAPS: 0.4000 mg | ORAL_CAPSULE | Freq: Every day | ORAL | Status: AC

## 2015-08-06 NOTE — Evaluation (Addendum)
Occupational Therapy Evaluation Patient Details Name: [name] [name] MRN: [id number] DOB: 1956-06-02 Today's Date: 08/06/2015    History of Present Illness 59 y.o. male with PMH of metastatic non-small cell lung cancer status post resection, completed 4 cycles of joint chemotherapy with cisplatin and Alimta, diagnosed with metastatic brain lesions in January 2016-status post stereotactic radiotherapy and craniotomy with resection of tumor, recent CT chest in August showed no evidence of disease progression and is being observed by oncology, completed radiation treatment approximately 3-4 weeks ago, HTN, HLD, anxiety, COPD, former smoker and admitted for generalized weakness likely secondary to multifactorial from dehydration, residual versus recurrence of brain metastasis, acute kidney injury and flu shot that he received day prior to admission   Clinical Impression   Pt was admitted for the above.  He had assistance for LB adls at baseline. Will follow in acute to build activity tolerance and to further educate on energy conservation.  Goals are for supervision with toilet transfer and utilizing rest breaks during activities    Follow Up Recommendations  No OT follow up    Equipment Recommendations  3 in 1 bedside comode (wide)    Recommendations for Other Services       Precautions / Restrictions Precautions Precautions: Fall Restrictions Weight Bearing Restrictions: No      Mobility Bed Mobility         Supine to sit: HOB elevated;Supervision Sit to supine: Supervision   General bed mobility comments: increased time and effort; watched catheter line  Transfers                      Balance                                            ADL Overall ADL's : Needs assistance/impaired                                       General ADL Comments: completed ADL from EOB.  Pt is able to complete UB with set up/supervision and LB with  mod A for bathing and max A for dressing.  He has help at home at baseline.  Pt had 2/4 dyspnea and cues were given to take rest breaks.  He had sat up in chair earlier  Pt has a shower seat at home:  Shower is very small.  He and wife feel comfortable with using this in shower. Not interested in AE at this time for LB adls; son will assist     Vision     Perception     Praxis      Pertinent Vitals/Pain Pain Assessment: 0-10 Pain Score: 5  Pain Location: head Pain Descriptors / Indicators: Aching Pain Intervention(s): Limited activity within patient's tolerance;Monitored during session;Premedicated before session     Hand Dominance     Extremity/Trunk Assessment Upper Extremity Assessment Upper Extremity Assessment: Overall WFL for tasks assessed           Communication Communication Communication: HOH   Cognition Arousal/Alertness: Awake/alert Behavior During Therapy: WFL for tasks assessed/performed Overall Cognitive Status: Within Functional Limits for tasks assessed                     General Comments  Exercises       Shoulder Instructions      Home Living Family/patient expects to be discharged to:: Private residence Living Arrangements: Spouse/significant other Available Help at Discharge: Family;Available PRN/intermittently Type of Home: House             Bathroom Shower/Tub: Walk-in shower (small)   Bathroom Toilet: Standard     Home Equipment: Environmental consultant - 2 wheels   Additional Comments: needs wide 3:1--his doesn't fit him.  ; daughter lives 10 minutes away and can assist as needed      Prior Functioning/Environment Level of Independence: Needs assistance        Comments: 101 year old son helped with socks/shoes prior to his going to school    OT Diagnosis: Generalized weakness   OT Problem List: Decreased activity tolerance;Pain;Cardiopulmonary status limiting activity   OT Treatment/Interventions: Self-care/ADL  training;DME and/or AE instruction;Patient/family education;Energy conservation    OT Goals(Current goals can be found in the care plan section) Acute Rehab OT Goals Patient Stated Goal: home OT Goal Formulation: With patient Time For Goal Achievement: 08/13/15 Potential to Achieve Goals: Good ADL Goals Pt Will Transfer to Toilet: with supervision;ambulating;bedside commode Additional ADL Goal #1: pt will initiate rest break, at least one per session for energy conservation  OT Frequency: Min 2X/week   Barriers to D/C:            Co-evaluation              End of Session    Activity Tolerance: Patient limited by fatigue Patient left: in bed;with call bell/phone within reach;with bed alarm set   Time: 1093-2355 OT Time Calculation (min): 23 min Charges:  OT General Charges $OT Visit: 1 Procedure OT Evaluation $Initial OT Evaluation Tier I: 1 Procedure G-Codes: OT G-codes **NOT FOR INPATIENT CLASS** Functional Assessment Tool Used: clinical observation/judgment Functional Limitation: Self care Self Care Current Status (D3220): At least 60 percent but less than 80 percent impaired, limited or restricted Self Care Goal Status (U5427): At least 1 percent but less than 20 percent impaired, limited or restricted  [facility] 08/06/2015, 8:49 AM  Lesle Chris, OTR/L [phone] 08/06/2015

## 2015-08-06 NOTE — Progress Notes (Signed)
D/C instructions reviewed w/ pt and family by Reatha Harps, RN. Pt and family verbalize understanding, all questions answered. Pt d/c in his own w/c in stable condition by this writer to family's car. Pt in possession of d/c instructions and all personal belongings.

## 2015-08-06 NOTE — Progress Notes (Signed)
10:51 am Received call from Pt.'s nurse Clarise Cruz Bright x [phone]) states patient for d/c today and has orders for [facility] PT, [place], w/c and bariatric 3in1.   Pt's height 6 ft. & weight 270 lbs.  Chart reviewed.  Spoke with patient, states he has had [facility] in the past and would like to use them again for [facility] needs.    DME referral called to [facility] @ [facility]  [phone]), will call CM back if any other information needed.   [place] referral called to [facility] @ [facility]  [phone]).    No further CM needs identified at this time.   Darlene H. Annia Friendly, BSN, [place], Case Management

## 2015-08-06 NOTE — Discharge Instructions (Signed)
Dehydration, Adult Dehydration is when you lose more fluids from the body than you take in. Vital organs like the kidneys, brain, and heart cannot function without a proper amount of fluids and salt. Any loss of fluids from the body can cause dehydration.  CAUSES   Vomiting.  Diarrhea.  Excessive sweating.  Excessive urine output.  Fever. SYMPTOMS  Mild dehydration  Thirst.  Dry lips.  Slightly dry mouth. Moderate dehydration  Very dry mouth.  Sunken eyes.  Skin does not bounce back quickly when lightly pinched and released.  Dark urine and decreased urine production.  Decreased tear production.  Headache. Severe dehydration  Very dry mouth.  Extreme thirst.  Rapid, weak pulse (more than 100 beats per minute at rest).  Cold hands and feet.  Not able to sweat in spite of heat and temperature.  Rapid breathing.  Blue lips.  Confusion and lethargy.  Difficulty being awakened.  Minimal urine production.  No tears. DIAGNOSIS  Your caregiver will diagnose dehydration based on your symptoms and your exam. Blood and urine tests will help confirm the diagnosis. The diagnostic evaluation should also identify the cause of dehydration. TREATMENT  Treatment of mild or moderate dehydration can often be done at home by increasing the amount of fluids that you drink. It is best to drink small amounts of fluid more often. Drinking too much at one time can make vomiting worse. Refer to the home care instructions below. Severe dehydration needs to be treated at the hospital where you will probably be given intravenous (IV) fluids that contain water and electrolytes. HOME CARE INSTRUCTIONS   Ask your caregiver about specific rehydration instructions.  Drink enough fluids to keep your urine clear or pale yellow.  Drink small amounts frequently if you have nausea and vomiting.  Eat as you normally do.  Avoid:  Foods or drinks high in sugar.  Carbonated  drinks.  Juice.  Extremely hot or cold fluids.  Drinks with caffeine.  Fatty, greasy foods.  Alcohol.  Tobacco.  Overeating.  Gelatin desserts.  Wash your hands well to avoid spreading bacteria and viruses.  Only take over-the-counter or prescription medicines for pain, discomfort, or fever as directed by your caregiver.  Ask your caregiver if you should continue all prescribed and over-the-counter medicines.  Keep all follow-up appointments with your caregiver. SEEK MEDICAL CARE IF:  You have abdominal pain and it increases or stays in one area (localizes).  You have a rash, stiff neck, or severe headache.  You are irritable, sleepy, or difficult to awaken.  You are weak, dizzy, or extremely thirsty. SEEK IMMEDIATE MEDICAL CARE IF:   You are unable to keep fluids down or you get worse despite treatment.  You have frequent episodes of vomiting or diarrhea.  You have blood or green matter (bile) in your vomit.  You have blood in your stool or your stool looks black and tarry.  You have not urinated in 6 to 8 hours, or you have only urinated a small amount of very dark urine.  You have a fever.  You faint. MAKE SURE YOU:   Understand these instructions.  Will watch your condition.  Will get help right away if you are not doing well or get worse. Document Released: 10/28/2005 Document Revised: 01/20/2012 Document Reviewed: 06/17/2011 Exit[employer] Patient Information 2015 [employer]. This information is not intended to replace advice given to you by your health care provider. Make sure you discuss any questions you have with your health care   provider.  

## 2015-08-06 NOTE — Discharge Summary (Signed)
Physician Discharge Summary  [name] [name] JYN:[id number] DOB: 13-May-1956 DOA: 08/04/2015  PCP: [name], MD  Outpatient Specialists:   Medical oncology: Dr. Curt Bears  Radiation oncology: Dr. Tyler Pita  Neurosurgery: Dr. Erline Levine  Pulmonology: Dr. Baltazar Apo  Admit date: 08/04/2015 Discharge date: 08/06/2015  Time spent: Greater than 30 minutes  Recommendations for Outpatient Follow-up:  1. Dr. Daiva Eves, PCP in 1 week 2. Dr. Tyler Pita, radiation oncology in 3 days 3. Dr. Curt Bears, medical oncology 4. Home health PT, OT, wheelchair and 3 n 1  Discharge Diagnoses:  Principal Problem:   Generalized weakness Active Problems:   HTN (hypertension)   Hypercholesteremia   Small-cell and non-small-cell cancer left     COPD (chronic obstructive pulmonary disease)   Brain metastases   Urinary retention   Failure to thrive in adult   Moderate dehydration   Discharge Condition: Improved & Stable  Diet recommendation: Heart healthy diet.  Filed Weights   08/04/15 1820 08/05/15 0441 08/06/15 0500  Weight: 130 kg (286 lb 9.6 oz) 129.8 kg (286 lb 2.5 oz) 122.471 kg (270 lb)    History of present illness:  59 y.o. male , married, lives with spouse, PMH of metastatic non-small cell lung cancer status post resection, completed 4 cycles of joint chemotherapy with cisplatin and Alimta, diagnosed with metastatic brain lesions in January 2016-status post stereotactic radiotherapy and craniotomy with resection of tumor, recent CT chest in August showed no evidence of disease progression and is being observed by oncology, completed radiation treatment approximately 3-4 weeks ago, HTN, HLD, anxiety, COPD, former smoker, presented to the [facility] ED on 08/04/15 with complaints of 3 weeks progressive generalized weakness which worsened over the last week and more so on the morning of admission (no focal extremity weakness or sensory symptoms).  Constipation +. Difficulty urinating although had sensation to do so. In the ED, Foley catheter placed for acute urinary retention, lab work significant for creatinine 1.37, chest x-ray without acute cardiopulmonary disease, CTA chest showed no evidence of pulmonary embolism or acute findings, PET scan off head done on 9/22 shows abnormal uptake within the posterior right parietal lesion and left lateral cerebellum, meningis and dura compatible with residual/recurrence of tumor. Admitted for generalized weakness, acute urinary retention, dehydration, acute kidney injury  Hospital Course:   Generalized weakness - Likely multifactorial from dehydration, residual versus recurrence of brain metastasis, acute kidney injury and flu shot that he received day prior to admission - No focal signs or symptoms to suggest spinal cord involvement at this time. Radiation oncology input appreciated-day to agree that he has no clinical features to suggest spinal cord issues at this time. - Improved after IV fluid hydration - PT and OT evaluation-input appreciated and home health services and DME ordered. 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 IV fluids, Foley catheter and follow BMP in a.m. Improving - Baseline creatinine probably in the 1.4-1.5 range. - Check renal ultrasound-no hydronephrosis or acute findings  - Creatinine has improved and plateaued in the 1.2 range .  Acute urinary retention - May be multifactorial secondary to prostatomegaly, constipation, opioid pain medications and immobility - Discussed with urologist on call 9/24  - Foley catheter was placed on admission and 48 hours later, discontinued today. Patient has voided urine post catheter removal. Flomax started this admission.  May consider outpatient urology  consultation.   Essential hypertension - Reasonably controlled  COPD - Stable. CTA chest shows no PE or acute findings. - Continue home regimen  Acute on possible chronic respiratory failure with hypoxia - Probably multifactorial from COPD, OHS/OSA, atelectasis. No PE or acute findings on CT chest. - Resolved  Constipation - Aggressive bowel regimen. Had BM after Dulcolax suppository this morning.   Cancer related pain - Continue home opioid pain regimen. Controlled  Metastatic non-small cell lung cancer/brain metastases - outpatient follow-up with oncology   Anemia - stable    Consultants:  Radiation oncology  Procedures:  Foley catheter 9/23 > 9/25   Antibiotics:  None  Discharge Exam:  Complaints:  feels much better. Feels stronger. Ambulated with PT yesterday. No pain or dyspnea reported. Voided after Foley catheter removal. Had BM after suppository.   Filed Vitals:   08/05/15 1432 08/05/15 2105 08/06/15 0500 08/06/15 0533  BP: 144/76 132/74  128/98  Pulse: 112 116  110  Temp: 98.9 F (37.2 C) 98.8 F (37.1 C)  98.7 F (37.1 C)  TempSrc: Oral Oral  Oral  Resp: '18 16  19  '$ Height:      Weight:   122.471 kg (270 lb)   SpO2: 99% 99%  100%    General exam: Moderately built and obese male sitting up comfortably in bed. Respiratory system: Clear. No increased work of breathing. Cardiovascular system: S1 & S2 heard, RRR. No JVD, murmurs, gallops, clicks or pedal edema.  Gastrointestinal system: Abdomen is nondistended, soft and nontender. Normal bowel sounds heard. Central nervous system: Alert and oriented. No focal neurological deficits. Extremities: Symmetric 5 x 5 power. No sensory deficits.  Discharge Instructions      Discharge Instructions    Call MD for:  difficulty breathing, headache or visual disturbances    Complete by:  As directed      Call MD for:  extreme fatigue    Complete by:  As directed      Call MD for:  persistant  dizziness or light-headedness    Complete by:  As directed      Call MD for:  persistant nausea and vomiting    Complete by:  As directed      Call MD for:  severe uncontrolled pain    Complete by:  As directed      Call MD for:  temperature >100.4    Complete by:  As directed      Diet - low sodium heart healthy    Complete by:  As directed      Increase activity slowly    Complete by:  As directed             Medication List    TAKE these medications        albuterol 108 (90 BASE) MCG/ACT inhaler  Commonly known as:  PROVENTIL HFA;VENTOLIN HFA  Inhale 2 puffs into the lungs every 6 (six) hours as needed for wheezing or shortness of breath.     albuterol (2.5 MG/3ML) 0.083% nebulizer solution  Commonly known as:  PROVENTIL  Take 3 mLs (2.5 mg total) by nebulization every 4 (four) hours as needed for wheezing or shortness of breath.     ALPRAZolam 0.25 MG tablet  Commonly known as:  XANAX  Take 1 tablet (0.25 mg total) by mouth at bedtime as needed for anxiety.     bisacodyl 10 MG suppository  Commonly known as:  DULCOLAX  Place 1  suppository (10 mg total) rectally daily as needed for moderate constipation.     dexamethasone 4 MG tablet  Commonly known as:  DECADRON  Take 2 mg by mouth 2 (two) times daily with a meal.     OVER THE COUNTER MEDICATION  Take 1 tablet by mouth daily. Potassium     oxyCODONE 5 MG immediate release tablet  Commonly known as:  Oxy IR/ROXICODONE  Take 1-3 tablets (5-15 mg total) by mouth every 4 (four) hours as needed for severe pain.     OxyCODONE 20 mg T12a 12 hr tablet  Commonly known as:  OXYCONTIN  Take 1 tablet (20 mg total) by mouth every 12 (twelve) hours.     polyethylene glycol packet  Commonly known as:  MIRALAX / GLYCOLAX  Take 17 g by mouth 2 (two) times daily.     senna 8.6 MG Tabs tablet  Commonly known as:  SENOKOT  Take 2 tablets (17.2 mg total) by mouth daily as needed for mild constipation.     tamsulosin 0.4 MG  Caps capsule  Commonly known as:  FLOMAX  Take 1 capsule (0.4 mg total) by mouth daily.     Tiotropium Bromide-Olodaterol 2.5-2.5 MCG/ACT Aers  Commonly known as:  STIOLTO RESPIMAT  Inhale 2 puffs into the lungs daily.       Follow-up Information    Follow up with [facility], MD. Schedule an appointment as soon as possible for a visit in 1 week.   Specialty:  Family Medicine   Contact information:   Dr. Daiva Eves [address] [place] [address] [phone]       Follow up with Tyler Pita, MD. Schedule an appointment as soon as possible for a visit in 3 days.   Specialty:  Radiation Oncology   Contact information:   [address] [place] [address] [phone]       Schedule an appointment as soon as possible for a visit with Eilleen Kempf., MD.   Specialty:  Oncology   Contact information:   [address] [place] [address] [phone]        The results of significant diagnostics from this hospitalization (including imaging, microbiology, ancillary and laboratory) are listed below for reference.    Significant Diagnostic Studies: Dg Chest 2 View  08/04/2015   CLINICAL DATA:  Patient complaining of diffuse weakness. Also with left upper chest pain as well as shortness of breath. History of left lung carcinoma. History of COPD and hypertension.  EXAM: CHEST  2 VIEW  COMPARISON:  Chest CT, 06/08/2015.  Chest radiograph, 11/28/2014.  FINDINGS: Cardiac silhouette is normal in size and configuration. No mediastinal or hilar masses or evidence of adenopathy.  There is decreased lung volume on the left, stable. Right lung is hyperexpanded. No evidence of pneumonia or pulmonary edema. No pleural effusion or pneumothorax.  Bony thorax is demineralized but grossly intact.  IMPRESSION: 1. No acute cardiopulmonary disease.   Electronically Signed   By: Lajean Manes M.D.   On: 08/04/2015 12:50   Ct Angio Chest Pe W/cm &/or Wo  Cm  08/04/2015   CLINICAL DATA:  Hypoxia.  Pt has HX of Lung CA with mets to the Brain. Generalized weakness, urinary retention, constipation, generalized cramping x 1 week however increased today.  Sob  EXAM: CT ANGIOGRAPHY CHEST WITH CONTRAST  TECHNIQUE: Multidetector CT imaging of the chest was performed using the standard protocol during bolus administration of intravenous contrast. Multiplanar CT image reconstructions  and MIPs were obtained to evaluate the vascular anatomy.  CONTRAST:  152m OMNIPAQUE IOHEXOL 350 MG/ML SOLN  COMPARISON:  Chest CT, 06/08/2015  FINDINGS: Angiographic study: No evidence of a pulmonary embolus. Great vessels are normal in caliber. No aortic dissection. Minimal plaque along the aortic arch.  Thoracic inlet:  No mass or adenopathy.  Normal thyroid.  Mediastinum and hila: Heart normal in size and configuration. Minimal pericardial effusion. Moderate coronary artery calcifications. No mediastinal or hilar masses or pathologically enlarged lymph nodes. Multiple surgical vascular clips are noted along the posterior inferior left hilum from the previous left lower lobectomy.  Lungs and pleura: There is volume loss on the left from the left lower lobectomy. Linear and reticular scarring and left posterior lateral pleural thickening is noted similar to the prior study. There is dependent linear opacity in the right lower lobe most consistent with subsegmental atelectasis. There is no lung consolidation to suggest pneumonia. No pulmonary edema. No lung mass or suspicious nodule. No pleural effusion or pneumothorax.  Limited upper abdomen:  Unremarkable.  Musculoskeletal: Bony thorax is demineralized. Mild degenerate spurring along the thoracic spine.  Review of the MIP images confirms the above findings.  IMPRESSION: 1. No evidence of a pulmonary embolism. 2. No acute findings. 3. Stable postsurgical changes from the left lobectomy. 4. Dependent right lower lobe subsegmental atelectasis. No  convincing pneumonia and no pulmonary edema.   Electronically Signed   By: DLajean ManesM.D.   On: 08/04/2015 15:46   Mr BJeri CosWPJContrast  07/21/2015   CLINICAL DATA:  Lung cancer with brain metastases. Status post non conformal radiation to the brain.  SRS to a right parietal lesion and a left cerebellar lesion.  EXAM: MRI HEAD WITHOUT AND WITH CONTRAST  TECHNIQUE: Multiplanar, multiecho pulse sequences of the brain and surrounding structures were obtained without and with intravenous contrast.  CONTRAST:  266mMULTIHANCE GADOBENATE DIMEGLUMINE 529 MG/ML IV SOLN  COMPARISON:  MRI brain 04/21/2015.  FINDINGS: There is significant reduction in in dural and leptomeningeal enhancement along the left lateral aspect of the cerebellum. Minimal parenchymal enhancement is again noted. Maximal thickness of the enhancement previously measured 21 mm. On today's study this is 12 mm. No new enhancement is evident within the cerebellum. A smaller focus of enhancement along the inferior aspect of the cerebellum no measures 5 mm, also reduced in size.  A focus of enhancement subjacent to the right parietal resection site has markedly increased in size since the prior exam. This lesion now measures 17 x 18 x 19 mm. Surrounding vasogenic edema has significantly increased as well.  No other supratentorial lesions are evident.  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  Mild generalized atrophy is again noted. T2 changes in the left cerebellum have significantly decreased since the prior exam.  Flow is present in the major intracranial arteries. The globes orbits are intact. Circumferential mucosal thickening is present in the left maxillary sinus. Remaining paranasal sinuses are clear. There is chronic fluid in the mastoid air cells bilaterally. No obstructing nasopharyngeal lesion is evident.  IMPRESSION: 1. Marked reduction in the extent of enhancement involving the left lateral  cerebellum, meninges, and dura. 2. Interval decrease in size of separate or inferior left cerebellar metastasis. 3. Significant increase in size of recurrent right parietal lesion with progressive surrounding vasogenic edema.   Electronically Signed   By: ChSan Morelle.D.   On: 07/21/2015 17:41   Us[place]al  08/05/2015   CLINICAL DATA:  Acute urinary retention.  EXAM: RENAL / URINARY TRACT ULTRASOUND COMPLETE  COMPARISON:  None.  FINDINGS: Right Kidney:  Length: 10.3 cm. Renal cortical thinning and increased cortical echogenicity. No hydronephrosis.  Left Kidney:  Length: 10.6 cm. Renal cortical thinning and increased cortical echogenicity. No hydronephrosis.  Bladder:  Nondistended and poorly evaluated.  IMPRESSION: No hydronephrosis.  Increased renal cortical echogenicity suggestive of chronic medical renal disease.   Electronically Signed   By: Lovey Newcomer M.D.   On: 08/05/2015 16:10   Nm Pet Metabolic Brain  9/60/4540   CLINICAL DATA:  SUBSEQUENT treatment strategy for BRAIN METASTASIS SECONDARY TO LUNG CANCER  EXAM: NUCLEAR MEDICINE PET BRAIN  TECHNIQUE: 9.9 mCi F-18 FDG was injected intravenously. Full-ring PET imaging was performed from the vertex to the feet after the radiotracer. CT data was obtained and used for attenuation correction and anatomic localization.  FASTING BLOOD GLUCOSE:  Value:   mg/dl  COMPARISON:  Brain MRI 07/21/2015  FINDINGS: Brain: There is a medium size focus of intense radiotracer uptake within the posterior cerebral hemisphere corresponding to the enlarging recurrent right parietal lesion. This measures approximately 1.3 cm and has an SUV max equal to 16.39.  Within the periphery of the left cerebellar hemisphere there is linear increased radiotracer uptake corresponding to the area of abnormal enhancement within the left lateral cerebellum, meninges and dura. The SUV max within this area is equal to 12.03. No additional areas of abnormal uptake identified within the  brain.  IMPRESSION: 1. There is abnormal increased FDG uptake within the posterior right parietal lesion and left lateral cerebellum, meninges and dura compatible with residual/recurrence of tumor.   Electronically Signed   By: Kerby Moors M.D.   On: 08/03/2015 11:16    Microbiology: No results found for this or any previous visit (from the past 240 hour(s)).   Labs: Basic Metabolic Panel:  Recent Labs Lab 08/04/15 1139 08/05/15 0525 08/06/15 0417  NA 137 137 138  K 3.8 4.0 4.1  CL 101 105 106  CO2 '27 27 26  '$ GLUCOSE 94 104* 105*  BUN 25* 16 15  CREATININE 1.37* 1.26* 1.24  CALCIUM 8.8* 8.3* 8.5*   Liver Function Tests:  Recent Labs Lab 08/04/15 1139  AST 27  ALT 45  ALKPHOS 57  BILITOT 1.2  PROT 6.8  ALBUMIN 3.8   No results for input(s): LIPASE, AMYLASE in the last 168 hours. No results for input(s): AMMONIA in the last 168 hours. CBC:  Recent Labs Lab 08/04/15 1139 08/05/15 0525 08/06/15 0417  WBC 8.7 7.3 7.6  NEUTROABS 6.6  --   --   HGB 13.1 11.5* 11.4*  HCT 41.1 35.8* 35.2*  MCV 84.6 85.0 84.0  PLT 185 191 223   Cardiac Enzymes: No results for input(s): CKTOTAL, CKMB, CKMBINDEX, TROPONINI in the last 168 hours. BNP: BNP (last 3 results) No results for input(s): BNP in the last 8760 hours.  ProBNP (last 3 results) No results for input(s): PROBNP in the last 8760 hours.  CBG: No results for input(s): GLUCAP in the last 168 hours.      Signed:  Vernell Leep, MD, FACP, FHM. [facility] Pager [phone]  If 7PM-7AM, please contact night-coverage [email or web address] Password [email or web address] 08/06/2015, 12:36 PM

## 2015-08-07 ENCOUNTER — Other Ambulatory Visit: Payer: Self-pay | Admitting: Radiation Oncology

## 2015-08-07 DIAGNOSIS — C3490 Malignant neoplasm of unspecified part of unspecified bronchus or lung: Secondary | ICD-10-CM

## 2015-08-09 ENCOUNTER — Other Ambulatory Visit: Payer: Self-pay | Admitting: Radiation Therapy

## 2015-08-09 DIAGNOSIS — IMO0002 Reserved for concepts with insufficient information to code with codable children: Secondary | ICD-10-CM

## 2015-08-10 ENCOUNTER — Other Ambulatory Visit: Payer: Self-pay | Admitting: Radiation Oncology

## 2015-08-10 ENCOUNTER — Ambulatory Visit (HOSPITAL_COMMUNITY): Payer: BLUE CROSS/BLUE SHIELD

## 2015-08-10 ENCOUNTER — Encounter: Payer: Self-pay | Admitting: Radiation Oncology

## 2015-08-10 ENCOUNTER — Ambulatory Visit (HOSPITAL_COMMUNITY)
Admission: RE | Admit: 2015-08-10 | Discharge: 2015-08-10 | Disposition: A | Discharge status: Home / Self Care | Payer: BLUE CROSS/BLUE SHIELD | Source: Ambulatory Visit | Attending: Radiation Oncology | Admitting: Radiation Oncology

## 2015-08-10 ENCOUNTER — Other Ambulatory Visit: Payer: Self-pay | Admitting: Radiation Therapy

## 2015-08-10 ENCOUNTER — Ambulatory Visit (HOSPITAL_COMMUNITY): Admission: RE | Admit: 2015-08-10 | Payer: BLUE CROSS/BLUE SHIELD | Source: Ambulatory Visit

## 2015-08-10 VITALS — BP 144/93 | HR 121 | Temp 97.9°F | Resp 18

## 2015-08-10 DIAGNOSIS — M47894 Other spondylosis, thoracic region: Secondary | ICD-10-CM | POA: Diagnosis not present

## 2015-08-10 DIAGNOSIS — R339 Retention of urine, unspecified: Secondary | ICD-10-CM

## 2015-08-10 DIAGNOSIS — C7949 Secondary malignant neoplasm of other parts of nervous system: Principal | ICD-10-CM

## 2015-08-10 DIAGNOSIS — C801 Malignant (primary) neoplasm, unspecified: Principal | ICD-10-CM

## 2015-08-10 DIAGNOSIS — Z515 Encounter for palliative care: Secondary | ICD-10-CM | POA: Insufficient documentation

## 2015-08-10 DIAGNOSIS — C7931 Secondary malignant neoplasm of brain: Secondary | ICD-10-CM

## 2015-08-10 DIAGNOSIS — R29898 Other symptoms and signs involving the musculoskeletal system: Secondary | ICD-10-CM

## 2015-08-10 DIAGNOSIS — G893 Neoplasm related pain (acute) (chronic): Secondary | ICD-10-CM | POA: Insufficient documentation

## 2015-08-10 DIAGNOSIS — IMO0002 Reserved for concepts with insufficient information to code with codable children: Secondary | ICD-10-CM

## 2015-08-10 DIAGNOSIS — Z7189 Other specified counseling: Secondary | ICD-10-CM | POA: Insufficient documentation

## 2015-08-10 DIAGNOSIS — M4804 Spinal stenosis, thoracic region: Secondary | ICD-10-CM | POA: Insufficient documentation

## 2015-08-10 DIAGNOSIS — M5124 Other intervertebral disc displacement, thoracic region: Secondary | ICD-10-CM | POA: Insufficient documentation

## 2015-08-10 DIAGNOSIS — R531 Weakness: Secondary | ICD-10-CM | POA: Insufficient documentation

## 2015-08-10 DIAGNOSIS — R937 Abnormal findings on diagnostic imaging of other parts of musculoskeletal system: Secondary | ICD-10-CM | POA: Insufficient documentation

## 2015-08-10 DIAGNOSIS — C3431 Malignant neoplasm of lower lobe, right bronchus or lung: Secondary | ICD-10-CM

## 2015-08-10 NOTE — Progress Notes (Addendum)
Patient seen by urologist this morning. Patient catheterized at urologist office and holding approximately 500 cc of urine per his wife. Reports DRE was done to determine if he has a possibility of prostate ca per wife. Patient wearing depends today because he is incontinent of urine and bowel. Wife reports changing his depends 4-5 times per day. Patient reports he is not taking decadron as directed. Wife requesting refill of decadron prescription. Wife has noted recent confusion. Patient reports he continues to take oxycontin 20 mg bid plus 2-3 tablets of oxy ir every three hours for generalized pain. Wife reports he fell the other night in the bedroom due to weakness. Wife reports he did sustain any injury. Patient in a wheelchair today.   BP 144/93 mmHg  Pulse 121  Temp(Src) 97.9 F (36.6 C) (Oral)  Resp 18  Wt   SpO2 100% Wt Readings from Last 3 Encounters:  08/06/15 270 lb (122.471 kg)  08/03/15 286 lb (129.729 kg)  06/19/15 273 lb 14.4 oz (124.24 kg)

## 2015-08-10 NOTE — Progress Notes (Signed)
Radiation Oncology         [phone] ________________________________  Name: [name] [name] MRN: [id number]  Date: 08/10/2015  DOB: Jul 01, 1956    Follow-Up Visit Note  CC: [name], MD  Curt Bears, MD  Diagnosis: 59 yo man with recurrent left cerebellar brain metastasis    ICD-9-CM ICD-10-CM   1. Brain metastases 198.3 C79.31    Interval Since Last Radiation:  2 months; 05/22/2015-06/08/2015 The posterior fossa brain was treated to 35 Gy in 14 fractions of 2.5 Gy  Narrative:  The patient returns today for routine follow-up. Patient seen by urologist this morning. Patient catheterized at urologist office and holding approximately 500 cc of urine per his wife. Reports DRE was done to determine if he has a possibility of prostate ca per wife. Patient wearing depends today because he is incontinent of urine and bowel. Wife reports changing his depends 4-5 times per day. Patient reports he is not taking decadron as directed. Wife requesting refill of decadron prescription. Wife has noted recent confusion. Patient reports he continues to take oxycontin 20 mg bid plus 2-3 tablets of oxy ir every three hours for generalized pain. Wife reports he fell the other night in the bedroom due to weakness. Wife reports he did sustain any injury. Patient in a wheelchair today. He reports weakness in legs. Denies numbness in legs. He does not trust himself walking for fear of falling. He has appetite loss and has not been hydrating. They plan to begin physical therapy in the near future. His next follow up appointment in medical oncology is scheduled for December.                        ALLERGIES:  has No Known Allergies.  Meds: Current Outpatient Prescriptions  Medication Sig Dispense Refill  . albuterol (PROVENTIL HFA;VENTOLIN HFA) 108 (90 BASE) MCG/ACT inhaler Inhale 2 puffs into the lungs every 6 (six) hours as needed for wheezing or shortness of breath.    Marland Kitchen albuterol (PROVENTIL) (2.5  MG/3ML) 0.083% nebulizer solution Take 3 mLs (2.5 mg total) by nebulization every 4 (four) hours as needed for wheezing or shortness of breath. 75 mL 3  . ALPRAZolam (XANAX) 0.25 MG tablet Take 1 tablet (0.25 mg total) by mouth at bedtime as needed for anxiety. 45 tablet 0  . bisacodyl (DULCOLAX) 10 MG suppository Place 1 suppository (10 mg total) rectally daily as needed for moderate constipation. 12 suppository 0  . dexamethasone (DECADRON) 4 MG tablet Take 2 mg by mouth 2 (two) times daily with a meal.     . OVER THE COUNTER MEDICATION Take 1 tablet by mouth daily. Potassium    . oxyCODONE (OXY IR/ROXICODONE) 5 MG immediate release tablet Take 1-3 tablets (5-15 mg total) by mouth every 4 (four) hours as needed for severe pain. 240 tablet 0  . OxyCODONE (OXYCONTIN) 20 mg T12A 12 hr tablet Take 1 tablet (20 mg total) by mouth every 12 (twelve) hours. 60 tablet 0  . polyethylene glycol (MIRALAX / GLYCOLAX) packet Take 17 g by mouth 2 (two) times daily. 14 each 0  . polyethylene glycol powder (GLYCOLAX/MIRALAX) powder   0  . senna (SENOKOT) 8.6 MG TABS tablet Take 2 tablets (17.2 mg total) by mouth daily as needed for mild constipation. 30 each 0  . tamsulosin (FLOMAX) 0.4 MG CAPS capsule Take 1 capsule (0.4 mg total) by mouth daily. 30 capsule 0  . Tiotropium Bromide-Olodaterol (STIOLTO RESPIMAT) 2.5-2.5 MCG/ACT  AERS Inhale 2 puffs into the lungs daily. 4 g 0   No current facility-administered medications for this encounter.   Facility-Administered Medications Ordered in Other Encounters  Medication Dose Route Frequency Provider Last Rate Last Dose  . topical emolient (BIAFINE) emulsion   Topical Daily Tyler Pita, MD      . topical emolient (BIAFINE) emulsion   Topical PRN Tyler Pita, MD        Physical Findings: The patient is in no acute distress. Patient is alert and oriented.  oral temperature is 97.9 F (36.6 C). His blood pressure is 144/93 and his pulse is 121. His  respiration is 18 and oxygen saturation is 100%.  Cushingoid features including moon face, and increased abdominal girth and lower extremity edema.   Lab Findings: Lab Results  Component Value Date   WBC 7.6 08/06/2015   WBC 14.0* 06/08/2015   HGB 11.4* 08/06/2015   HGB 13.6 06/08/2015   HCT 35.2* 08/06/2015   HCT 40.9 06/08/2015   PLT 223 08/06/2015   PLT 206 06/08/2015    Lab Results  Component Value Date   NA 138 08/06/2015   NA 137 06/08/2015   NA 138 03/25/2012   K 4.1 08/06/2015   K 4.7 06/08/2015   K 4.2 03/25/2012   CHLORIDE 104 06/08/2015   CO2 26 08/06/2015   CO2 28 06/08/2015   CO2 26 03/25/2012   GLUCOSE 105* 08/06/2015   GLUCOSE 124 06/08/2015   GLUCOSE 107* 03/03/2013   GLUCOSE 99 03/25/2012   BUN 15 08/06/2015   BUN 33.6* 06/08/2015   BUN 11 03/25/2012   CREATININE 1.24 08/06/2015   CREATININE 1.5* 06/08/2015   CREATININE 1.0 03/25/2012   BILITOT 1.2 08/04/2015   BILITOT 0.31 06/08/2015   BILITOT 0.80 03/25/2012   ALKPHOS 57 08/04/2015   ALKPHOS 82 06/08/2015   ALKPHOS 84 03/25/2012   AST 27 08/04/2015   AST 22 06/08/2015   AST 33 03/25/2012   ALT 45 08/04/2015   ALT 61* 06/08/2015   ALT 43 03/25/2012   PROT 6.8 08/04/2015   PROT 6.3* 06/08/2015   PROT 7.4 03/25/2012   ALBUMIN 3.8 08/04/2015   ALBUMIN 3.5 06/08/2015   CALCIUM 8.5* 08/06/2015   CALCIUM 8.8 06/08/2015   CALCIUM 8.6 03/25/2012   ANIONGAP 6 08/06/2015   ANIONGAP 5 06/08/2015    Radiographic Findings: Dg Chest 2 View  08/04/2015   CLINICAL DATA:  Patient complaining of diffuse weakness. Also with left upper chest pain as well as shortness of breath. History of left lung carcinoma. History of COPD and hypertension.  EXAM: CHEST  2 VIEW  COMPARISON:  Chest CT, 06/08/2015.  Chest radiograph, 11/28/2014.  FINDINGS: Cardiac silhouette is normal in size and configuration. No mediastinal or hilar masses or evidence of adenopathy.  There is decreased lung volume on the left, stable.  Right lung is hyperexpanded. No evidence of pneumonia or pulmonary edema. No pleural effusion or pneumothorax.  Bony thorax is demineralized but grossly intact.  IMPRESSION: 1. No acute cardiopulmonary disease.   Electronically Signed   By: Lajean Manes M.D.   On: 08/04/2015 12:50   Ct Angio Chest Pe W/cm &/or Wo Cm  08/04/2015   CLINICAL DATA:  Hypoxia.  Pt has HX of Lung CA with mets to the Brain. Generalized weakness, urinary retention, constipation, generalized cramping x 1 week however increased today.  Sob  EXAM: CT ANGIOGRAPHY CHEST WITH CONTRAST  TECHNIQUE: Multidetector CT imaging of the chest was performed using the  standard protocol during bolus administration of intravenous contrast. Multiplanar CT image reconstructions and MIPs were obtained to evaluate the vascular anatomy.  CONTRAST:  154m OMNIPAQUE IOHEXOL 350 MG/ML SOLN  COMPARISON:  Chest CT, 06/08/2015  FINDINGS: Angiographic study: No evidence of a pulmonary embolus. Great vessels are normal in caliber. No aortic dissection. Minimal plaque along the aortic arch.  Thoracic inlet:  No mass or adenopathy.  Normal thyroid.  Mediastinum and hila: Heart normal in size and configuration. Minimal pericardial effusion. Moderate coronary artery calcifications. No mediastinal or hilar masses or pathologically enlarged lymph nodes. Multiple surgical vascular clips are noted along the posterior inferior left hilum from the previous left lower lobectomy.  Lungs and pleura: There is volume loss on the left from the left lower lobectomy. Linear and reticular scarring and left posterior lateral pleural thickening is noted similar to the prior study. There is dependent linear opacity in the right lower lobe most consistent with subsegmental atelectasis. There is no lung consolidation to suggest pneumonia. No pulmonary edema. No lung mass or suspicious nodule. No pleural effusion or pneumothorax.  Limited upper abdomen:  Unremarkable.  Musculoskeletal: Bony  thorax is demineralized. Mild degenerate spurring along the thoracic spine.  Review of the MIP images confirms the above findings.  IMPRESSION: 1. No evidence of a pulmonary embolism. 2. No acute findings. 3. Stable postsurgical changes from the left lobectomy. 4. Dependent right lower lobe subsegmental atelectasis. No convincing pneumonia and no pulmonary edema.   Electronically Signed   By: DLajean ManesM.D.   On: 08/04/2015 15:46   Mr BJeri CosWOAContrast  07/21/2015   CLINICAL DATA:  Lung cancer with brain metastases. Status post non conformal radiation to the brain.  SRS to a right parietal lesion and a left cerebellar lesion.  EXAM: MRI HEAD WITHOUT AND WITH CONTRAST  TECHNIQUE: Multiplanar, multiecho pulse sequences of the brain and surrounding structures were obtained without and with intravenous contrast.  CONTRAST:  250mMULTIHANCE GADOBENATE DIMEGLUMINE 529 MG/ML IV SOLN  COMPARISON:  MRI brain 04/21/2015.  FINDINGS: There is significant reduction in in dural and leptomeningeal enhancement along the left lateral aspect of the cerebellum. Minimal parenchymal enhancement is again noted. Maximal thickness of the enhancement previously measured 21 mm. On today's study this is 12 mm. No new enhancement is evident within the cerebellum. A smaller focus of enhancement along the inferior aspect of the cerebellum no measures 5 mm, also reduced in size.  A focus of enhancement subjacent to the right parietal resection site has markedly increased in size since the prior exam. This lesion now measures 17 x 18 x 19 mm. Surrounding vasogenic edema has significantly increased as well.  No other supratentorial lesions are evident.  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  Mild generalized atrophy is again noted. T2 changes in the left cerebellum have significantly decreased since the prior exam.  Flow is present in the major intracranial arteries. The globes orbits are intact.  Circumferential mucosal thickening is present in the left maxillary sinus. Remaining paranasal sinuses are clear. There is chronic fluid in the mastoid air cells bilaterally. No obstructing nasopharyngeal lesion is evident.  IMPRESSION: 1. Marked reduction in the extent of enhancement involving the left lateral cerebellum, meninges, and dura. 2. Interval decrease in size of separate or inferior left cerebellar metastasis. 3. Significant increase in size of recurrent right parietal lesion with progressive surrounding vasogenic edema.   Electronically Signed   By: ChHarrell Gave  Mattern M.D.   On: 07/21/2015 17:41   US Renal  08/05/2015   CLINICAL DATA:  Acute urinary retention.  EXAM: RENAL / URINARY TRACT ULTRASOUND COMPLETE  COMPARISON:  None.  FINDINGS: Right Kidney:  Length: 10.3 cm. Renal cortical thinning and increased cortical echogenicity. No hydronephrosis.  Left Kidney:  Length: 10.6 cm. Renal cortical thinning and increased cortical echogenicity. No hydronephrosis.  Bladder:  Nondistended and poorly evaluated.  IMPRESSION: No hydronephrosis.  Increased renal cortical echogenicity suggestive of chronic medical renal disease.   Electronically Signed   By: Lovey Newcomer M.D.   On: 08/05/2015 84:78   Nm Pet Metabolic Brain  02/21/8207   CLINICAL DATA:  SUBSEQUENT treatment strategy for BRAIN METASTASIS SECONDARY TO LUNG CANCER  EXAM: NUCLEAR MEDICINE PET BRAIN  TECHNIQUE: 9.9 mCi F-18 FDG was injected intravenously. Full-ring PET imaging was performed from the vertex to the feet after the radiotracer. CT data was obtained and used for attenuation correction and anatomic localization.  FASTING BLOOD GLUCOSE:  Value:   mg/dl  COMPARISON:  Brain MRI 07/21/2015  FINDINGS: Brain: There is a medium size focus of intense radiotracer uptake within the posterior cerebral hemisphere corresponding to the enlarging recurrent right parietal lesion. This measures approximately 1.3 cm and has an SUV max equal to 16.39.   Within the periphery of the left cerebellar hemisphere there is linear increased radiotracer uptake corresponding to the area of abnormal enhancement within the left lateral cerebellum, meninges and dura. The SUV max within this area is equal to 12.03. No additional areas of abnormal uptake identified within the brain.  IMPRESSION: 1. There is abnormal increased FDG uptake within the posterior right parietal lesion and left lateral cerebellum, meninges and dura compatible with residual/recurrence of tumor.   Electronically Signed   By: Kerby Moors M.D.   On: 08/03/2015 11:16   Impression:  The patient has a recurrent right parietal brain met with edema. Recent PET scans on 9/22 confirmed recurrence. Discuss with Dr. Vertell Limber the potential for surgery. If the spinal MRI is clear, I would recommend surgery. We discussed life expectations with vs. without surgery. If resection is ruled out there is potential for repeat radiosurgery.   Plan:  Spine MRI currently scheduled for later today.  We will follow-up on results.  This document serves as a record of services personally performed by Tyler Pita, MD. It was created on his behalf by Arlyce Harman, a trained medical scribe. The creation of this record is based on the scribe's personal observations and the provider's statements to them. This document has been checked and approved by the attending provider.       _____________________________________  Sheral Apley. Tammi Klippel, M.D.

## 2015-08-11 ENCOUNTER — Other Ambulatory Visit: Payer: Self-pay | Admitting: Radiation Oncology

## 2015-08-11 ENCOUNTER — Telehealth: Payer: Self-pay | Admitting: Radiation Oncology

## 2015-08-11 ENCOUNTER — Other Ambulatory Visit: Payer: Self-pay | Admitting: *Deleted

## 2015-08-11 ENCOUNTER — Ambulatory Visit: Payer: Self-pay | Admitting: Radiation Oncology

## 2015-08-11 DIAGNOSIS — C7931 Secondary malignant neoplasm of brain: Secondary | ICD-10-CM

## 2015-08-11 DIAGNOSIS — C7949 Secondary malignant neoplasm of other parts of nervous system: Principal | ICD-10-CM

## 2015-08-11 NOTE — Telephone Encounter (Signed)
Kenney Houseman of [facility] phoned to relay report results for MRI done last night. She explains the patient refused contrast. Reports spondalosis was noted at T9-T10. Mild cord flattening noted at T3-T4. Reports edema of cervical spine and thoracic spine noted. Reports a mass can't be excluded at C4-C5 thus, an MRI of the cervical spine is strongly encouraged. Encouraged Dr. Tammi Klippel of these findings.

## 2015-08-13 ENCOUNTER — Other Ambulatory Visit: Payer: Self-pay | Admitting: Radiation Oncology

## 2015-08-13 DIAGNOSIS — C7931 Secondary malignant neoplasm of brain: Secondary | ICD-10-CM

## 2015-08-13 MED ORDER — LORAZEPAM 1 MG PO TABS: 1.0000 mg | ORAL_TABLET | Freq: Three times a day (TID) | ORAL | Status: AC | PRN

## 2015-08-14 ENCOUNTER — Other Ambulatory Visit: Payer: Self-pay | Admitting: Radiation Therapy

## 2015-08-14 ENCOUNTER — Telehealth: Payer: Self-pay | Admitting: Radiation Oncology

## 2015-08-14 DIAGNOSIS — C7931 Secondary malignant neoplasm of brain: Secondary | ICD-10-CM

## 2015-08-14 NOTE — Telephone Encounter (Signed)
Per Dr. Johny Shears order called in ativan 1 mg to [facility].

## 2015-08-16 ENCOUNTER — Encounter: Payer: Self-pay | Admitting: Radiation Oncology

## 2015-08-16 ENCOUNTER — Encounter (HOSPITAL_COMMUNITY): Payer: Self-pay | Admitting: *Deleted

## 2015-08-16 NOTE — Progress Notes (Signed)
Pt denies SOB, chest pain, and being under the care of a cardiologist. Pt denies having a stress test, echo and cardiac cath. Pt made aware to stop otc vitamins and herbal medications. Pt/daughter verbalized understanding of all pre-op instructions.

## 2015-08-16 NOTE — Progress Notes (Signed)
Patients wife [name] called and wanted to know DOS arrival time. Nurse instructed patients wife that patient needed to be here at 0530. Patients wife verbalized understanding.

## 2015-08-17 ENCOUNTER — Ambulatory Visit (HOSPITAL_COMMUNITY): Admission: RE | Admit: 2015-08-17 | Payer: BLUE CROSS/BLUE SHIELD | Source: Ambulatory Visit

## 2015-08-17 ENCOUNTER — Ambulatory Visit (HOSPITAL_COMMUNITY)
Admission: RE | Admit: 2015-08-17 | Discharge: 2015-08-17 | Disposition: A | Discharge status: Home / Self Care | Payer: BLUE CROSS/BLUE SHIELD | Source: Ambulatory Visit | Attending: Radiation Oncology | Admitting: Radiation Oncology

## 2015-08-17 ENCOUNTER — Encounter (HOSPITAL_COMMUNITY): Payer: Self-pay | Admitting: *Deleted

## 2015-08-17 ENCOUNTER — Ambulatory Visit (HOSPITAL_COMMUNITY): Payer: BLUE CROSS/BLUE SHIELD | Admitting: Anesthesiology

## 2015-08-17 ENCOUNTER — Encounter: Payer: Self-pay | Admitting: Radiation Oncology

## 2015-08-17 ENCOUNTER — Ambulatory Visit (HOSPITAL_COMMUNITY): Payer: BLUE CROSS/BLUE SHIELD

## 2015-08-17 ENCOUNTER — Encounter (HOSPITAL_COMMUNITY)
Admission: RE | Disposition: A | Discharge status: Home / Self Care | Payer: Self-pay | Source: Ambulatory Visit | Attending: Radiation Oncology

## 2015-08-17 ENCOUNTER — Other Ambulatory Visit (HOSPITAL_COMMUNITY): Payer: BLUE CROSS/BLUE SHIELD

## 2015-08-17 DIAGNOSIS — M4802 Spinal stenosis, cervical region: Secondary | ICD-10-CM | POA: Diagnosis not present

## 2015-08-17 DIAGNOSIS — C7931 Secondary malignant neoplasm of brain: Secondary | ICD-10-CM

## 2015-08-17 DIAGNOSIS — N3289 Other specified disorders of bladder: Secondary | ICD-10-CM | POA: Diagnosis not present

## 2015-08-17 DIAGNOSIS — R339 Retention of urine, unspecified: Secondary | ICD-10-CM | POA: Insufficient documentation

## 2015-08-17 DIAGNOSIS — M5126 Other intervertebral disc displacement, lumbar region: Secondary | ICD-10-CM | POA: Insufficient documentation

## 2015-08-17 DIAGNOSIS — C3490 Malignant neoplasm of unspecified part of unspecified bronchus or lung: Secondary | ICD-10-CM | POA: Insufficient documentation

## 2015-08-17 HISTORY — PX: RADIOLOGY WITH ANESTHESIA: SHX6223

## 2015-08-17 HISTORY — DX: Other retention of urine: R33.8

## 2015-08-17 SURGERY — RADIOLOGY WITH ANESTHESIA
Anesthesia: General

## 2015-08-17 MED ORDER — LABETALOL HCL 5 MG/ML IV SOLN
10.0000 mg | INTRAVENOUS | Status: AC | PRN
  Administered 2015-08-17 (×2): 10 mg via INTRAVENOUS

## 2015-08-17 MED ORDER — MEPERIDINE HCL 25 MG/ML IJ SOLN: 6.2500 mg | INTRAMUSCULAR | Status: DC | PRN

## 2015-08-17 MED ORDER — HYDROMORPHONE HCL 1 MG/ML IJ SOLN: 0.2500 mg | INTRAMUSCULAR | Status: DC | PRN

## 2015-08-17 MED ORDER — LABETALOL HCL 5 MG/ML IV SOLN
INTRAVENOUS | Status: AC
  Administered 2015-08-17: 10 mg via INTRAVENOUS
  Filled 2015-08-17: qty 4

## 2015-08-17 MED ORDER — MIDAZOLAM HCL 2 MG/2ML IJ SOLN
INTRAMUSCULAR | Status: AC
  Administered 2015-08-17: 1 mg via INTRAVENOUS
  Filled 2015-08-17: qty 2

## 2015-08-17 MED ORDER — LACTATED RINGERS IV SOLN
INTRAVENOUS | Status: DC | PRN
  Administered 2015-08-17: 08:00:00 via INTRAVENOUS

## 2015-08-17 MED ORDER — LABETALOL HCL 5 MG/ML IV SOLN
INTRAVENOUS | Status: AC
  Filled 2015-08-17: qty 4

## 2015-08-17 MED ORDER — MIDAZOLAM HCL 2 MG/2ML IJ SOLN
1.0000 mg | Freq: Once | INTRAMUSCULAR | Status: AC
  Administered 2015-08-17: 1 mg via INTRAVENOUS
  Filled 2015-08-17: qty 1

## 2015-08-17 MED ORDER — ONDANSETRON HCL 4 MG/2ML IJ SOLN: 4.0000 mg | Freq: Once | INTRAMUSCULAR | Status: DC | PRN

## 2015-08-17 NOTE — Transfer of Care (Signed)
Immediate Anesthesia Transfer of Care Note  Patient: [name] [name]  Procedure(s) Performed: Procedure(s): MRI  OF CERVICAL AND LUMBER SPINE (N/A)  Patient Location: PACU  Anesthesia Type:General  Level of Consciousness: awake, alert , oriented and patient cooperative  Airway & Oxygen Therapy: Patient Spontanous Breathing and Patient connected to face mask oxygen  Post-op Assessment: Report given to RN and Post -op Vital signs reviewed and stable  Post vital signs: Reviewed and stable  Last Vitals:  Filed Vitals:   08/17/15 0726  BP:   Temp: 36.6 C  Resp:     Complications: No apparent anesthesia complications

## 2015-08-17 NOTE — Anesthesia Postprocedure Evaluation (Signed)
Anesthesia Post Note  Patient: [name] [name]  Procedure(s) Performed: Procedure(s) (LRB): MRI  OF CERVICAL AND LUMBER SPINE (N/A)  Anesthesia type: general  Patient location: PACU  Post pain: Pain level controlled  Post assessment: Patient's Cardiovascular Status Stable  Last Vitals:  Filed Vitals:   08/17/15 1255  BP: 151/107  Pulse: 88  Temp:   Resp: 10    Post vital signs: Reviewed and stable  Level of consciousness: sedated  Complications: No apparent anesthesia complications

## 2015-08-17 NOTE — Anesthesia Preprocedure Evaluation (Signed)
Anesthesia Evaluation  Patient identified by MRN, date of birth, ID band Patient awake    Reviewed: Allergy & Precautions, NPO status , Patient's Chart, lab work & pertinent test results  Airway Mallampati: II  TM Distance: >3 FB Neck ROM: Full    Dental   Pulmonary former smoker,    Pulmonary exam normal        Cardiovascular hypertension, Pt. on medications Normal cardiovascular exam     Neuro/Psych Anxiety    GI/Hepatic   Endo/Other    Renal/GU      Musculoskeletal   Abdominal   Peds  Hematology   Anesthesia Other Findings   Reproductive/Obstetrics                             Anesthesia Physical Anesthesia Plan  ASA: III  Anesthesia Plan: General   Post-op Pain Management:    Induction: Intravenous  Airway Management Planned: LMA  Additional Equipment:   Intra-op Plan:   Post-operative Plan: Extubation in OR  Informed Consent: I have reviewed the patients History and Physical, chart, labs and discussed the procedure including the risks, benefits and alternatives for the proposed anesthesia with the patient or authorized representative who has indicated his/her understanding and acceptance.     Plan Discussed with: CRNA and Surgeon  Anesthesia Plan Comments:         Anesthesia Quick Evaluation

## 2015-08-17 NOTE — Progress Notes (Signed)
  Radiation Oncology         [phone] ________________________________  Name: [name] [name] MRN: [id number]  Date: 08/17/2015  DOB: 02-01-1956  Chart Note:  I reviewed this patient's most recent findings and wanted to take a minute to document my impression.  This patient presented to the emergency department 13 days ago with urinary retention and global weakness. 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 This study was finally completed earlier today and shows diffuse leptomeningeal enhancement over the surface of the entire spinal cord with some thickened areas likely responsible for the patient's presenting symptoms.  At this point, the patient has options for consideration of craniospinal radiation, intrathecal chemotherapy, or supportive care. Regardless of the choice, the patient's survival is likely measured in weeks or perhaps months. His functional status will likely remain poor and declined further.  I discussed the findings with Dr. Julien Nordmann and the patient's wife. The patient will present to our clinic later today to review the results of the MRI and determine a plan.  At this point, my likely recommendation would be for hospice care and Dr. Julien Nordmann is supportive of this approach. The patient will meet with palliative care during his visit to our clinic this afternoon as well.  The patient was also being seen by urology due to his urinary retention and found to have an elevated PSA. Under the circumstances, this appears to be neurogenic in nature. I will copy [facility] on this note to make them aware of the new development of diffuse leptomeningeal carcinomatosis.   ________________________________  Sheral Apley Tammi Klippel, M.D.

## 2015-08-18 ENCOUNTER — Other Ambulatory Visit: Payer: BLUE CROSS/BLUE SHIELD

## 2015-08-18 ENCOUNTER — Encounter (HOSPITAL_COMMUNITY): Payer: Self-pay | Admitting: Radiology

## 2015-08-18 ENCOUNTER — Telehealth: Payer: Self-pay | Admitting: Medical Oncology

## 2015-08-18 NOTE — Telephone Encounter (Signed)
I called [facility] and told them [name] [name] will be attending and hospice provider manage  symptoms . Records sent to Hosp[facility].

## 2015-08-22 MED FILL — Ondansetron HCl Inj 4 MG/2ML (2 MG/ML): INTRAMUSCULAR | Qty: 2 | Status: AC

## 2015-08-22 MED FILL — Succinylcholine Chloride Sol Pref Syr 200 MG/10ML (20 MG/ML): INTRAVENOUS | Qty: 5 | Status: AC

## 2015-08-22 MED FILL — Ephedrine Sulf-NaCl Soln Pref Syr 50 MG/10ML-0.9% (5 MG/ML): INTRAVENOUS | Qty: 10 | Status: AC

## 2015-08-22 MED FILL — Propofol IV Emul 200 MG/20ML (10 MG/ML): INTRAVENOUS | Qty: 20 | Status: AC

## 2015-08-22 MED FILL — Glycopyrrolate Inj 0.2 MG/ML: INTRAMUSCULAR | Qty: 1 | Status: AC

## 2015-08-22 MED FILL — Midazolam HCl Inj 2 MG/2ML (Base Equivalent): INTRAMUSCULAR | Qty: 2 | Status: AC

## 2015-08-22 MED FILL — Rocuronium Bromide IV Soln 100 MG/10ML (10 MG/ML): INTRAVENOUS | Qty: 2 | Status: AC

## 2015-08-22 MED FILL — Neostigmine Methylsulfate IV Soln 10 MG/10 ML (1 MG/ML): INTRAVENOUS | Qty: 0.3 | Status: AC

## 2015-08-22 MED FILL — Lactated Ringer's Solution: INTRAVENOUS | Qty: 1000 | Status: AC

## 2015-08-22 MED FILL — Lidocaine HCl IV Inj 20 MG/ML: INTRAVENOUS | Qty: 5 | Status: AC

## 2015-08-25 ENCOUNTER — Other Ambulatory Visit (HOSPITAL_COMMUNITY): Payer: BLUE CROSS/BLUE SHIELD

## 2015-08-29 ENCOUNTER — Other Ambulatory Visit: Payer: BLUE CROSS/BLUE SHIELD

## 2015-08-31 ENCOUNTER — Encounter: Payer: Self-pay | Admitting: Internal Medicine

## 2015-08-31 NOTE — Progress Notes (Signed)
I left [facility] form for shannon on desk of nurse for dr. Julien Nordmann

## 2015-09-05 ENCOUNTER — Encounter: Payer: Self-pay | Admitting: Internal Medicine

## 2015-09-05 ENCOUNTER — Encounter: Payer: Self-pay | Admitting: Emergency Medicine

## 2015-09-05 ENCOUNTER — Encounter: Payer: Self-pay | Admitting: *Deleted

## 2015-09-08 ENCOUNTER — Encounter: Payer: Self-pay | Admitting: Internal Medicine

## 2015-09-08 NOTE — Progress Notes (Signed)
I faxed [employer] forms [phone] made copy for patient records

## 2015-09-08 NOTE — Progress Notes (Signed)
I mailed copy to the patient/wife for their records

## 2015-09-12 DEATH — deceased

## 2015-10-16 ENCOUNTER — Encounter: Payer: Self-pay | Admitting: Internal Medicine

## 2015-10-18 ENCOUNTER — Other Ambulatory Visit: Payer: BLUE CROSS/BLUE SHIELD

## 2015-10-18 ENCOUNTER — Ambulatory Visit (HOSPITAL_COMMUNITY): Payer: Self-pay

## 2015-10-25 ENCOUNTER — Ambulatory Visit: Payer: BLUE CROSS/BLUE SHIELD | Admitting: Internal Medicine

## 2015-10-29 ENCOUNTER — Encounter: Payer: Self-pay | Admitting: Radiation Therapy

## 2015-10-31 ENCOUNTER — Ambulatory Visit: Payer: BLUE CROSS/BLUE SHIELD | Admitting: Emergency Medicine

## 2016-04-30 ENCOUNTER — Other Ambulatory Visit: Payer: Self-pay | Admitting: Nurse Practitioner
